# Patient Record
Sex: Male | Born: 1965 | Race: White | Hispanic: No | Marital: Married | Smoking: Never smoker
Health system: Southern US, Community
[De-identification: ages and names within clinical notes are randomized; demographics above are authoritative.]

## PROBLEM LIST (undated history)

## (undated) DIAGNOSIS — E7800 Pure hypercholesterolemia, unspecified: Secondary | ICD-10-CM

## (undated) DIAGNOSIS — Z8619 Personal history of other infectious and parasitic diseases: Secondary | ICD-10-CM

## (undated) DIAGNOSIS — I10 Essential (primary) hypertension: Secondary | ICD-10-CM

## (undated) HISTORY — PX: TUMOR REMOVAL: SHX12

## (undated) HISTORY — DX: Pure hypercholesterolemia, unspecified: E78.00

## (undated) HISTORY — DX: Essential (primary) hypertension: I10

## (undated) HISTORY — DX: Personal history of other infectious and parasitic diseases: Z86.19

---

## 2012-09-26 ENCOUNTER — Ambulatory Visit: Payer: Self-pay | Admitting: Surgery

## 2012-12-23 ENCOUNTER — Encounter: Payer: Self-pay | Admitting: Internal Medicine

## 2012-12-23 ENCOUNTER — Ambulatory Visit (INDEPENDENT_AMBULATORY_CARE_PROVIDER_SITE_OTHER): Payer: BC Managed Care – PPO | Admitting: Internal Medicine

## 2012-12-23 VITALS — BP 130/100 | HR 67 | Temp 97.7°F | Ht 68.2 in | Wt 245.5 lb

## 2012-12-23 DIAGNOSIS — D179 Benign lipomatous neoplasm, unspecified: Secondary | ICD-10-CM

## 2012-12-23 DIAGNOSIS — E7800 Pure hypercholesterolemia, unspecified: Secondary | ICD-10-CM

## 2012-12-23 DIAGNOSIS — Z125 Encounter for screening for malignant neoplasm of prostate: Secondary | ICD-10-CM

## 2012-12-23 DIAGNOSIS — R030 Elevated blood-pressure reading, without diagnosis of hypertension: Secondary | ICD-10-CM

## 2012-12-23 LAB — CBC WITH DIFFERENTIAL/PLATELET
Basophils Absolute: 0 10*3/uL (ref 0.0–0.1)
Eosinophils Relative: 2.8 % (ref 0.0–5.0)
HCT: 46.6 % (ref 39.0–52.0)
Lymphs Abs: 1.8 10*3/uL (ref 0.7–4.0)
MCV: 84.7 fl (ref 78.0–100.0)
Monocytes Absolute: 0.5 10*3/uL (ref 0.1–1.0)
Monocytes Relative: 9.2 % (ref 3.0–12.0)
Neutrophils Relative %: 56.8 % (ref 43.0–77.0)
Platelets: 282 10*3/uL (ref 150.0–400.0)
RDW: 13.4 % (ref 11.5–14.6)
WBC: 5.8 10*3/uL (ref 4.5–10.5)

## 2012-12-23 LAB — COMPREHENSIVE METABOLIC PANEL
ALT: 26 U/L (ref 0–53)
AST: 19 U/L (ref 0–37)
Albumin: 3.8 g/dL (ref 3.5–5.2)
Alkaline Phosphatase: 77 U/L (ref 39–117)
Glucose, Bld: 87 mg/dL (ref 70–99)
Potassium: 4.4 mEq/L (ref 3.5–5.1)
Sodium: 139 mEq/L (ref 135–145)
Total Bilirubin: 0.7 mg/dL (ref 0.3–1.2)
Total Protein: 7.2 g/dL (ref 6.0–8.3)

## 2012-12-23 LAB — URINALYSIS, ROUTINE W REFLEX MICROSCOPIC
Bilirubin Urine: NEGATIVE
Leukocytes, UA: NEGATIVE
Nitrite: NEGATIVE
Total Protein, Urine: NEGATIVE
Urobilinogen, UA: 0.2 (ref 0.0–1.0)

## 2012-12-23 LAB — LIPID PANEL
HDL: 39.2 mg/dL (ref >39.00)
Total CHOL/HDL Ratio: 7
VLDL: 28.6 mg/dL (ref 0.0–40.0)

## 2012-12-23 LAB — PSA: PSA: 0.42 ng/mL (ref 0.10–4.00)

## 2012-12-23 LAB — TSH: TSH: 0.85 u[IU]/mL (ref 0.35–5.50)

## 2012-12-23 NOTE — Progress Notes (Signed)
  Subjective:    Patient ID: Samuel Elliott, male    DOB: [birth year], 47 y.o.   MRN: [id number]  HPI 47 year old male with past history of hypertension and hypercholesterolemia who comes in today to follow up on these issues as well as to establish care.  Previously seen at [facility].  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  Plays golf.  Tries to stay active.  No cardiac symptoms with increased activity or exertion.  Bowels stable.     Past Medical History  Diagnosis Date  . History of chicken pox   . Hypercholesterolemia   . Hypertension     Review of Systems Patient denies any headache, lightheadedness or dizziness.  No sinus or allergy symptoms.  No chest pain, tightness or palpitations.  No increased shortness of breath, cough or congestion.  No nausea or vomiting.  No acid reflux.  No abdominal pain or cramping.  No bowel change, such as diarrhea, constipation, BRBPR or melana.  No urine change.   Tries to stay active.  Overall he feels he is doing well.       Objective:   Physical Exam Filed Vitals:   12/23/12 0919  BP: 130/100  Pulse: 67  Temp: 97.7 F (36.5 C)   Blood pressure recheck:  42/40  47 year old male in no acute distress.  HEENT:  Nares - clear.  Oropharynx - without lesions. NECK:  Supple.  Nontender.  No audible carotid bruit.  HEART:  Appears to be regular.   LUNGS:  No crackles or wheezing audible.  Respirations even and unlabored.   RADIAL PULSE:  Equal bilaterally.  ABDOMEN:  Soft.  Nontender.  Bowel sounds present and normal.  No audible abdominal bruit.    EXTREMITIES:  No increased edema present.  DP pulses palpable and equal bilaterally.          Assessment & Plan:  HEALTH MAINTENANCE.  Schedule him for a complete physical.  Check cholesterol and PSA.    I spent 30 minutes with the patient and more than 50% of the time was spent in consultation regarding the above.

## 2012-12-28 ENCOUNTER — Encounter: Payer: Self-pay | Admitting: Internal Medicine

## 2012-12-28 ENCOUNTER — Other Ambulatory Visit: Payer: Self-pay | Admitting: Internal Medicine

## 2012-12-28 DIAGNOSIS — D179 Benign lipomatous neoplasm, unspecified: Secondary | ICD-10-CM | POA: Insufficient documentation

## 2012-12-28 DIAGNOSIS — E7800 Pure hypercholesterolemia, unspecified: Secondary | ICD-10-CM | POA: Insufficient documentation

## 2012-12-28 DIAGNOSIS — I10 Essential (primary) hypertension: Secondary | ICD-10-CM | POA: Insufficient documentation

## 2012-12-28 NOTE — Progress Notes (Signed)
Order placed for f/u liver panel check.  

## 2012-12-28 NOTE — Assessment & Plan Note (Signed)
S/p removal.  Doing well.  Evaluated by Dr Ely.  

## 2012-12-28 NOTE — Assessment & Plan Note (Signed)
Blood pressure on recheck today 132/86.  Will have him spot check his pressure and record readings.  Get him back in soon to reassess.  If persistent elevation, may need medication.

## 2012-12-28 NOTE — Assessment & Plan Note (Signed)
Low cholesterol diet and exercise.  Check lipid panel.   

## 2012-12-31 NOTE — Progress Notes (Signed)
Wife notified of lab results-will call back with the name of the Cholesterol medication that he has taken in the past (if caused him to have leg cramps)-will schedule lab appt at that time also.

## 2013-01-05 ENCOUNTER — Other Ambulatory Visit: Payer: Self-pay | Admitting: *Deleted

## 2013-01-05 MED ORDER — PRAVASTATIN SODIUM 10 MG PO TABS: 10.0000 mg | ORAL_TABLET | Freq: Every day | ORAL | Status: DC

## 2013-01-05 NOTE — Telephone Encounter (Signed)
Pt states that Lipitor caused leg cramps-would like an alternative (per last phone note). Phoned in Pravastatin 10mg  daily. Pt to schedule a 6 wk lab appt

## 2013-02-10 ENCOUNTER — Encounter: Payer: Self-pay | Admitting: Internal Medicine

## 2013-02-17 ENCOUNTER — Ambulatory Visit (INDEPENDENT_AMBULATORY_CARE_PROVIDER_SITE_OTHER): Payer: BC Managed Care – PPO | Admitting: Internal Medicine

## 2013-02-17 ENCOUNTER — Encounter: Payer: Self-pay | Admitting: Internal Medicine

## 2013-02-17 VITALS — BP 130/90 | HR 64 | Temp 98.7°F | Ht 68.2 in | Wt 248.0 lb

## 2013-02-17 DIAGNOSIS — E7800 Pure hypercholesterolemia, unspecified: Secondary | ICD-10-CM

## 2013-02-17 DIAGNOSIS — D179 Benign lipomatous neoplasm, unspecified: Secondary | ICD-10-CM

## 2013-02-17 DIAGNOSIS — I10 Essential (primary) hypertension: Secondary | ICD-10-CM

## 2013-02-17 MED ORDER — LISINOPRIL 10 MG PO TABS: 10.0000 mg | ORAL_TABLET | Freq: Every day | ORAL | Status: DC

## 2013-02-18 ENCOUNTER — Encounter: Payer: Self-pay | Admitting: Internal Medicine

## 2013-02-18 NOTE — Assessment & Plan Note (Signed)
S/p removal.  Doing well.  Evaluated by Dr Ely.  

## 2013-02-18 NOTE — Assessment & Plan Note (Signed)
Persistent elevation.  Recent metabolic panel wnl.  Start lisinopril 10mg  q day.  Follow pressures.  Get him back in soon to reassess.  Check metabolic panel in two weeks.

## 2013-02-18 NOTE — Assessment & Plan Note (Signed)
Low cholesterol diet and exercise.  Now on pravastatin.  Tolerating.  Check liver panel.

## 2013-02-18 NOTE — Progress Notes (Signed)
  Subjective:    Patient ID: Samuel Elliott, male    DOB: [birth year], 47 y.o.   MRN: [id number]  HPI 47 year old male with past history of hypertension and hypercholesterolemia who comes in today to follow up on these issues as well as for a complete physical exam.  Plays golf.  Tries to stay active.  No cardiac symptoms with increased activity or exertion.  Bowels stable.  Has been checking his blood pressures and blood pressure remains elevated - averaging 150-160s/70-90.  No headache or dizziness.  Overall he feels he is doing well.  Taking the pravastatin.  Tolerating.     Past Medical History  Diagnosis Date  . History of chicken pox   . Hypercholesterolemia   . Hypertension     Current Outpatient Prescriptions on File Prior to Visit  Medication Sig Dispense Refill  . pravastatin (PRAVACHOL) 10 MG tablet Take 1 tablet (10 mg total) by mouth daily.  30 tablet  5   No current facility-administered medications on file prior to visit.    Review of Systems Patient denies any headache, lightheadedness or dizziness.  No sinus or allergy symptoms.  No chest pain, tightness or palpitations.  No increased shortness of breath, cough or congestion.  No nausea or vomiting.  No acid reflux.  No abdominal pain or cramping.  No bowel change, such as diarrhea, constipation, BRBPR or melana.  No urine change.   Tries to stay active.  Overall he feels he is doing well.  Blood pressures as outlined.      Objective:   Physical Exam  Filed Vitals:   02/17/13 0853  BP: 130/90  Pulse: 64  Temp: 98.7 F (37.1 C)   Blood pressure recheck:  144/94, pulse 25  47 year old male in no acute distress.  HEENT:  Nares - clear.  Oropharynx - without lesions. NECK:  Supple.  Nontender.  No audible carotid bruit.  HEART:  Appears to be regular.   LUNGS:  No crackles or wheezing audible.  Respirations even and unlabored.   RADIAL PULSE:  Equal bilaterally.  ABDOMEN:  Soft.  Nontender.  Bowel sounds present and  normal.  No audible abdominal bruit.  GU:  Normal descended testicles.  No palpable testicular nodules.   RECTAL:  Could not appreciate any palpable prostate nodules.  Heme negative.   EXTREMITIES:  No increased edema present.  DP pulses palpable and equal bilaterally.         Assessment & Plan:  HEALTH MAINTENANCE.  Physical today.    12/23/12 PSA .42.

## 2013-03-02 ENCOUNTER — Other Ambulatory Visit (INDEPENDENT_AMBULATORY_CARE_PROVIDER_SITE_OTHER): Payer: BC Managed Care – PPO

## 2013-03-02 DIAGNOSIS — I10 Essential (primary) hypertension: Secondary | ICD-10-CM

## 2013-03-02 DIAGNOSIS — E7800 Pure hypercholesterolemia, unspecified: Secondary | ICD-10-CM

## 2013-03-02 LAB — BASIC METABOLIC PANEL
BUN: 10 mg/dL (ref 6–23)
CO2: 27 mEq/L (ref 19–32)
Chloride: 106 mEq/L (ref 96–112)
Creatinine, Ser: 1.1 mg/dL (ref 0.4–1.5)

## 2013-03-02 LAB — HEPATIC FUNCTION PANEL
ALT: 33 U/L (ref 0–53)
AST: 20 U/L (ref 0–37)
Albumin: 3.6 g/dL (ref 3.5–5.2)
Alkaline Phosphatase: 74 U/L (ref 39–117)
Bilirubin, Direct: 0.1 mg/dL (ref 0.0–0.3)

## 2013-03-03 ENCOUNTER — Encounter: Payer: Self-pay | Admitting: Internal Medicine

## 2013-03-13 ENCOUNTER — Encounter: Payer: Self-pay | Admitting: Internal Medicine

## 2013-03-13 ENCOUNTER — Ambulatory Visit (INDEPENDENT_AMBULATORY_CARE_PROVIDER_SITE_OTHER): Payer: BC Managed Care – PPO | Admitting: Internal Medicine

## 2013-03-13 VITALS — BP 130/80 | HR 71 | Temp 97.8°F | Ht 68.2 in | Wt 252.8 lb

## 2013-03-13 DIAGNOSIS — Z23 Encounter for immunization: Secondary | ICD-10-CM

## 2013-03-13 DIAGNOSIS — D179 Benign lipomatous neoplasm, unspecified: Secondary | ICD-10-CM

## 2013-03-13 DIAGNOSIS — E7800 Pure hypercholesterolemia, unspecified: Secondary | ICD-10-CM

## 2013-03-13 DIAGNOSIS — I10 Essential (primary) hypertension: Secondary | ICD-10-CM

## 2013-03-13 MED ORDER — LISINOPRIL 20 MG PO TABS: 20.0000 mg | ORAL_TABLET | Freq: Every day | ORAL | Status: DC

## 2013-03-13 MED ORDER — PRAVASTATIN SODIUM 10 MG PO TABS: 10.0000 mg | ORAL_TABLET | Freq: Every day | ORAL | Status: DC

## 2013-03-13 NOTE — Progress Notes (Signed)
Pre-visit discussion using our clinic review tool. No additional management support is needed unless otherwise documented below in the visit note.  

## 2013-03-13 NOTE — Assessment & Plan Note (Signed)
S/p removal.  Doing well.  Evaluated by Dr Ely.  

## 2013-03-13 NOTE — Assessment & Plan Note (Addendum)
Blood pressure as outlined.  Still elevated.   Will increase lisinopril to 20mg  q day.  Follow pressures.

## 2013-03-13 NOTE — Progress Notes (Signed)
  Subjective:    Patient ID: Samuel Elliott, male    DOB: [birth year], 47 y.o.   MRN: [id number]  HPI 47 year old male with past history of hypertension and hypercholesterolemia who comes in today for a scheduled follow up.   Plays golf.  Tries to stay active.  No cardiac symptoms with increased activity or exertion.  Bowels stable.  Has been checking his blood pressures and blood pressure remains elevated - averaging 140s/80-90.  No headache or dizziness.  Overall he feels he is doing well.  Taking the pravastatin.  Tolerating.  Tolerating lisinopril.  Just started last visit.     Past Medical History  Diagnosis Date  . History of chicken pox   . Hypercholesterolemia   . Hypertension     Current Outpatient Prescriptions on File Prior to Visit  Medication Sig Dispense Refill  . lisinopril (PRINIVIL,ZESTRIL) 10 MG tablet Take 1 tablet (10 mg total) by mouth daily.  30 tablet  1  . pravastatin (PRAVACHOL) 10 MG tablet Take 1 tablet (10 mg total) by mouth daily.  30 tablet  5   No current facility-administered medications on file prior to visit.    Review of Systems Patient denies any headache, lightheadedness or dizziness.  No sinus or allergy symptoms.  No chest pain, tightness or palpitations.  No increased shortness of breath, cough or congestion.  No nausea or vomiting.  No acid reflux.  No abdominal pain or cramping.  No bowel change, such as diarrhea, constipation, BRBPR or melana.  No urine change.   Tries to stay active.  Overall he feels he is doing well.  Blood pressures as outlined.  Tolerating lisinopril.      Objective:   Physical Exam  Filed Vitals:   03/13/13 0914  BP: 130/80  Pulse: 71  Temp: 97.8 F (36.6 C)   Blood pressure recheck:  23/4  47 year old male in no acute distress.  HEENT:  Nares - clear.  Oropharynx - without lesions. NECK:  Supple.  Nontender.  No audible carotid bruit.  HEART:  Appears to be regular.   LUNGS:  No crackles or wheezing audible.   Respirations even and unlabored.   RADIAL PULSE:  Equal bilaterally.  ABDOMEN:  Soft.  Nontender.  Bowel sounds present and normal.  No audible abdominal bruit.   EXTREMITIES:  No increased edema present.  DP pulses palpable and equal bilaterally.         Assessment & Plan:  HEALTH MAINTENANCE.  Physical 02/17/13.    12/23/12 PSA .42.

## 2013-03-13 NOTE — Assessment & Plan Note (Addendum)
Low cholesterol diet and exercise.  Now on pravastatin.  Tolerating.  Follow lipid panel and liver function.     

## 2013-03-15 ENCOUNTER — Encounter: Payer: Self-pay | Admitting: Internal Medicine

## 2013-05-07 ENCOUNTER — Encounter: Payer: Self-pay | Admitting: Internal Medicine

## 2013-05-08 ENCOUNTER — Encounter: Payer: Self-pay | Admitting: Internal Medicine

## 2013-05-08 ENCOUNTER — Ambulatory Visit (INDEPENDENT_AMBULATORY_CARE_PROVIDER_SITE_OTHER): Payer: BC Managed Care – PPO | Admitting: Internal Medicine

## 2013-05-08 ENCOUNTER — Other Ambulatory Visit: Payer: Self-pay | Admitting: Internal Medicine

## 2013-05-08 VITALS — BP 130/80 | HR 63 | Temp 97.8°F | Ht 68.2 in | Wt 246.5 lb

## 2013-05-08 DIAGNOSIS — I10 Essential (primary) hypertension: Secondary | ICD-10-CM

## 2013-05-08 DIAGNOSIS — E7800 Pure hypercholesterolemia, unspecified: Secondary | ICD-10-CM

## 2013-05-08 DIAGNOSIS — D179 Benign lipomatous neoplasm, unspecified: Secondary | ICD-10-CM

## 2013-05-08 LAB — BASIC METABOLIC PANEL
BUN: 10 mg/dL (ref 6–23)
CHLORIDE: 106 meq/L (ref 96–112)
CO2: 30 mEq/L (ref 19–32)
Calcium: 10.3 mg/dL (ref 8.4–10.5)
Creatinine, Ser: 1.1 mg/dL (ref 0.4–1.5)
GFR: 78.95 mL/min (ref >60.00)
Glucose, Bld: 81 mg/dL (ref 70–99)
Potassium: 4.3 mEq/L (ref 3.5–5.1)
SODIUM: 140 meq/L (ref 135–145)

## 2013-05-08 LAB — HEPATIC FUNCTION PANEL
ALK PHOS: 80 U/L (ref 39–117)
ALT: 28 U/L (ref 0–53)
AST: 18 U/L (ref 0–37)
Albumin: 3.7 g/dL (ref 3.5–5.2)
BILIRUBIN DIRECT: 0.1 mg/dL (ref 0.0–0.3)
TOTAL PROTEIN: 6.8 g/dL (ref 6.0–8.3)
Total Bilirubin: 0.7 mg/dL (ref 0.3–1.2)

## 2013-05-08 LAB — LIPID PANEL
Cholesterol: 224 mg/dL — ABNORMAL HIGH (ref 0–200)
HDL: 37.1 mg/dL — ABNORMAL LOW (ref >39.00)
Total CHOL/HDL Ratio: 6
Triglycerides: 147 mg/dL (ref 0.0–149.0)
VLDL: 29.4 mg/dL (ref 0.0–40.0)

## 2013-05-08 LAB — LDL CHOLESTEROL, DIRECT: Direct LDL: 166.3 mg/dL

## 2013-05-08 MED ORDER — LISINOPRIL 20 MG PO TABS: 20.0000 mg | ORAL_TABLET | Freq: Every day | ORAL | Status: DC

## 2013-05-08 NOTE — Progress Notes (Signed)
  Subjective:    Patient ID: Samuel Elliott, male    DOB: 13-Jun-1965, 48 y.o.   MRN: [id number]  HPI 48 year old male with past history of hypertension and hypercholesterolemia who comes in today for a scheduled follow up.   Plays golf.  Tries to stay active.  No cardiac symptoms with increased activity or exertion.  Bowels stable.  No headache or dizziness.  Overall he feels he is doing well.  Taking the pravastatin.  Tolerating.  Tolerating lisinopril.  Blood pressure has been doing well.     Past Medical History  Diagnosis Date  . History of chicken pox   . Hypercholesterolemia   . Hypertension     Current Outpatient Prescriptions on File Prior to Visit  Medication Sig Dispense Refill  . lisinopril (PRINIVIL,ZESTRIL) 20 MG tablet Take 1 tablet (20 mg total) by mouth daily.  90 tablet  1  . pravastatin (PRAVACHOL) 10 MG tablet Take 1 tablet (10 mg total) by mouth daily.  90 tablet  1   No current facility-administered medications on file prior to visit.    Review of Systems Patient denies any headache, lightheadedness or dizziness.  No sinus or allergy symptoms.  No chest pain, tightness or palpitations.  No increased shortness of breath, cough or congestion.  No nausea or vomiting.  No acid reflux.  No abdominal pain or cramping.  No bowel change, such as diarrhea, constipation, BRBPR or melana.  No urine change.   Tries to stay active.  Overall he feels he is doing well.  Blood pressures doing better.  States his blood pressure is averaging 120-130/80-85.  Tolerating lisinopril.      Objective:   Physical Exam  Filed Vitals:   05/08/13 0936  BP: 130/80  Pulse: 63  Temp: 97.8 F (36.6 C)   Blood pressure recheck:  27/45  48 year old male in no acute distress.  HEENT:  Nares - clear.  Oropharynx - without lesions. NECK:  Supple.  Nontender.  No audible carotid bruit.  HEART:  Appears to be regular.   LUNGS:  No crackles or wheezing audible.  Respirations even and unlabored.    RADIAL PULSE:  Equal bilaterally.  ABDOMEN:  Soft.  Nontender.  Bowel sounds present and normal.  No audible abdominal bruit.   EXTREMITIES:  No increased edema present.  DP pulses palpable and equal bilaterally.         Assessment & Plan:  HEALTH MAINTENANCE.  Physical 02/17/13.    12/23/12 PSA .42.

## 2013-05-08 NOTE — Progress Notes (Signed)
Pre-visit discussion using our clinic review tool. No additional management support is needed unless otherwise documented below in the visit note.  

## 2013-05-08 NOTE — Assessment & Plan Note (Addendum)
Blood pressure better on lisinopril 20mg q day.  Check metabolic panel.  Follow.   

## 2013-05-08 NOTE — Progress Notes (Signed)
Orders placed for f/u labs.  

## 2013-05-10 ENCOUNTER — Encounter: Payer: Self-pay | Admitting: Internal Medicine

## 2013-05-10 ENCOUNTER — Other Ambulatory Visit: Payer: Self-pay | Admitting: Internal Medicine

## 2013-05-10 MED ORDER — PRAVASTATIN SODIUM 20 MG PO TABS: 20.0000 mg | ORAL_TABLET | Freq: Every day | ORAL | Status: DC

## 2013-05-10 NOTE — Progress Notes (Signed)
Pravastatin changed to 20mg  q day and rx sent in for #90 with one refill.

## 2013-05-10 NOTE — Assessment & Plan Note (Signed)
Low cholesterol diet and exercise.  Now on pravastatin.  Tolerating.  Follow lipid panel and liver function.     

## 2013-05-10 NOTE — Assessment & Plan Note (Signed)
S/p removal.  Doing well.  Evaluated by Dr Pat Patrick.

## 2013-09-11 ENCOUNTER — Ambulatory Visit: Payer: BC Managed Care – PPO | Admitting: Internal Medicine

## 2013-10-01 ENCOUNTER — Ambulatory Visit: Payer: BC Managed Care – PPO | Admitting: Internal Medicine

## 2013-11-02 ENCOUNTER — Ambulatory Visit (INDEPENDENT_AMBULATORY_CARE_PROVIDER_SITE_OTHER): Payer: BC Managed Care – PPO | Admitting: Internal Medicine

## 2013-11-02 ENCOUNTER — Encounter: Payer: Self-pay | Admitting: Internal Medicine

## 2013-11-02 VITALS — BP 130/80 | HR 64 | Temp 98.2°F | Ht 68.2 in | Wt 246.5 lb

## 2013-11-02 DIAGNOSIS — I10 Essential (primary) hypertension: Secondary | ICD-10-CM

## 2013-11-02 DIAGNOSIS — E7800 Pure hypercholesterolemia, unspecified: Secondary | ICD-10-CM

## 2013-11-02 DIAGNOSIS — L299 Pruritus, unspecified: Secondary | ICD-10-CM

## 2013-11-02 LAB — LIPID PANEL
CHOL/HDL RATIO: 5
Cholesterol: 207 mg/dL — ABNORMAL HIGH (ref 0–200)
HDL: 38.2 mg/dL — ABNORMAL LOW (ref >39.00)
LDL CALC: 144 mg/dL — AB (ref 0–99)
NonHDL: 168.8
Triglycerides: 125 mg/dL (ref 0.0–149.0)
VLDL: 25 mg/dL (ref 0.0–40.0)

## 2013-11-02 LAB — BASIC METABOLIC PANEL
BUN: 11 mg/dL (ref 6–23)
CALCIUM: 9.9 mg/dL (ref 8.4–10.5)
CO2: 29 meq/L (ref 19–32)
CREATININE: 1.3 mg/dL (ref 0.4–1.5)
Chloride: 106 mEq/L (ref 96–112)
GFR: 61.17 mL/min (ref >60.00)
Glucose, Bld: 108 mg/dL — ABNORMAL HIGH (ref 70–99)
Potassium: 4.6 mEq/L (ref 3.5–5.1)
Sodium: 139 mEq/L (ref 135–145)

## 2013-11-02 LAB — HEPATIC FUNCTION PANEL
ALT: 35 U/L (ref 0–53)
AST: 21 U/L (ref 0–37)
Albumin: 3.7 g/dL (ref 3.5–5.2)
Alkaline Phosphatase: 71 U/L (ref 39–117)
BILIRUBIN TOTAL: 0.7 mg/dL (ref 0.2–1.2)
Bilirubin, Direct: 0 mg/dL (ref 0.0–0.3)
Total Protein: 7.1 g/dL (ref 6.0–8.3)

## 2013-11-02 NOTE — Assessment & Plan Note (Signed)
Pt request referral to ENT (Dr Kathyrn Sheriff) to have ears evaluated and cleaned.  Request appt in 11/15.  No pain in ear.

## 2013-11-02 NOTE — Assessment & Plan Note (Signed)
Blood pressure better on lisinopril 20mg  q day.  Check metabolic panel.  Follow.

## 2013-11-02 NOTE — Progress Notes (Signed)
  Subjective:    Patient ID: Samuel Fast., male    DOB: Apr 09, 1966, 48 y.o.   MRN: [id number]  HPI 48 year old male with past history of hypertension and hypercholesterolemia who comes in today for a scheduled follow up.   Plays golf.  Tries to stay active.  No cardiac symptoms with increased activity or exertion.  Bowels stable.  No headache or dizziness.  Overall he feels he is doing well.  Taking the pravastatin.  Tolerating.  Tolerating lisinopril.  Blood pressure has been doing well.  States blood pressure averaging low 120s-130/80-85.  Does request ENT referral for intermittent ear itching and wants ears cleaned.  Wants to see Dr Kathyrn Sheriff.     Past Medical History  Diagnosis Date  . History of chicken pox   . Hypercholesterolemia   . Hypertension     Current Outpatient Prescriptions on File Prior to Visit  Medication Sig Dispense Refill  . lisinopril (PRINIVIL,ZESTRIL) 20 MG tablet Take 1 tablet (20 mg total) by mouth daily.  90 tablet  1  . pravastatin (PRAVACHOL) 20 MG tablet Take 1 tablet (20 mg total) by mouth daily.  90 tablet  1   No current facility-administered medications on file prior to visit.    Review of Systems Patient denies any headache, lightheadedness or dizziness.  No significant sinus or allergy symptoms.  No chest pain, tightness or palpitations.  No increased shortness of breath, cough or congestion.  No nausea or vomiting.  No acid reflux.  No abdominal pain or cramping.  No bowel change, such as diarrhea, constipation, BRBPR or melana.  No urine change.   Tries to stay active.  Overall he feels he is doing well.  Blood pressures doing better.  States his blood pressure is averaging 120-130/80-85.  Tolerating lisinopril.  Request ENT referral as outlined.       Objective:   Physical Exam  Filed Vitals:   11/02/13 0758  BP: 130/80  Pulse: 64  Temp: 98.2 F (36.8 C)   Blood pressure recheck:  61/3   48 year old male in no acute distress.  HEENT:   Nares - clear.  Oropharynx - without lesions.  Ears- cerumen present.   NECK:  Supple.  Nontender.  No audible carotid bruit.  HEART:  Appears to be regular.   LUNGS:  No crackles or wheezing audible.  Respirations even and unlabored.   RADIAL PULSE:  Equal bilaterally.  ABDOMEN:  Soft.  Nontender.  Bowel sounds present and normal.  No audible abdominal bruit.   EXTREMITIES:  No increased edema present.  DP pulses palpable and equal bilaterally.         Assessment & Plan:  HEALTH MAINTENANCE.  Physical 02/17/13.    12/23/12 PSA .42.  Schedule a physical for next visit.

## 2013-11-02 NOTE — Assessment & Plan Note (Signed)
Low cholesterol diet and exercise.  Now on pravastatin.  Tolerating.  Follow lipid panel and liver function.

## 2013-11-02 NOTE — Addendum Note (Signed)
Addended by: Karlene Einstein D on: 11/02/2013 08:29 AM   Modules accepted: Orders

## 2013-11-03 ENCOUNTER — Other Ambulatory Visit: Payer: Self-pay | Admitting: Internal Medicine

## 2013-11-03 ENCOUNTER — Other Ambulatory Visit: Payer: Self-pay | Admitting: *Deleted

## 2013-11-03 DIAGNOSIS — R739 Hyperglycemia, unspecified: Secondary | ICD-10-CM

## 2013-11-03 MED ORDER — PRAVASTATIN SODIUM 40 MG PO TABS: 40.0000 mg | ORAL_TABLET | Freq: Every day | ORAL | Status: DC

## 2013-11-03 NOTE — Progress Notes (Signed)
Order placed for f/u labs.  

## 2013-11-18 ENCOUNTER — Other Ambulatory Visit (INDEPENDENT_AMBULATORY_CARE_PROVIDER_SITE_OTHER): Payer: BC Managed Care – PPO

## 2013-11-18 DIAGNOSIS — R7309 Other abnormal glucose: Secondary | ICD-10-CM

## 2013-11-18 DIAGNOSIS — R739 Hyperglycemia, unspecified: Secondary | ICD-10-CM

## 2013-11-18 LAB — BASIC METABOLIC PANEL
BUN: 13 mg/dL (ref 6–23)
CALCIUM: 9.6 mg/dL (ref 8.4–10.5)
CO2: 27 meq/L (ref 19–32)
CREATININE: 1.1 mg/dL (ref 0.4–1.5)
Chloride: 108 mEq/L (ref 96–112)
GFR: 73.93 mL/min (ref >60.00)
GLUCOSE: 95 mg/dL (ref 70–99)
Potassium: 4.2 mEq/L (ref 3.5–5.1)
Sodium: 143 mEq/L (ref 135–145)

## 2013-11-18 LAB — HEMOGLOBIN A1C: HEMOGLOBIN A1C: 5.5 % (ref 4.6–6.5)

## 2013-11-19 ENCOUNTER — Encounter: Payer: Self-pay | Admitting: Internal Medicine

## 2014-02-26 ENCOUNTER — Other Ambulatory Visit: Payer: Self-pay | Admitting: *Deleted

## 2014-02-26 MED ORDER — LISINOPRIL 20 MG PO TABS
20.0000 mg | ORAL_TABLET | Freq: Every day | ORAL | Status: DC
Start: 2014-02-26 — End: 2014-12-06

## 2014-03-05 ENCOUNTER — Encounter: Payer: BC Managed Care – PPO | Admitting: Internal Medicine

## 2014-04-22 ENCOUNTER — Ambulatory Visit (INDEPENDENT_AMBULATORY_CARE_PROVIDER_SITE_OTHER): Payer: BC Managed Care – PPO | Admitting: Internal Medicine

## 2014-04-22 ENCOUNTER — Encounter: Payer: Self-pay | Admitting: Internal Medicine

## 2014-04-22 ENCOUNTER — Ambulatory Visit (INDEPENDENT_AMBULATORY_CARE_PROVIDER_SITE_OTHER): Payer: BC Managed Care – PPO | Admitting: *Deleted

## 2014-04-22 VITALS — BP 110/70 | HR 62 | Temp 97.8°F | Ht 68.0 in | Wt 247.5 lb

## 2014-04-22 DIAGNOSIS — Z125 Encounter for screening for malignant neoplasm of prostate: Secondary | ICD-10-CM

## 2014-04-22 DIAGNOSIS — R739 Hyperglycemia, unspecified: Secondary | ICD-10-CM

## 2014-04-22 DIAGNOSIS — I10 Essential (primary) hypertension: Secondary | ICD-10-CM

## 2014-04-22 DIAGNOSIS — E7800 Pure hypercholesterolemia, unspecified: Secondary | ICD-10-CM

## 2014-04-22 DIAGNOSIS — Z23 Encounter for immunization: Secondary | ICD-10-CM

## 2014-04-22 DIAGNOSIS — E669 Obesity, unspecified: Secondary | ICD-10-CM

## 2014-04-22 DIAGNOSIS — E041 Nontoxic single thyroid nodule: Secondary | ICD-10-CM

## 2014-04-22 DIAGNOSIS — E049 Nontoxic goiter, unspecified: Secondary | ICD-10-CM

## 2014-04-22 LAB — CBC WITH DIFFERENTIAL/PLATELET
BASOS ABS: 0 10*3/uL (ref 0.0–0.1)
Basophils Relative: 0.5 % (ref 0.0–3.0)
EOS ABS: 0.2 10*3/uL (ref 0.0–0.7)
Eosinophils Relative: 2.3 % (ref 0.0–5.0)
HCT: 47.7 % (ref 39.0–52.0)
Hemoglobin: 16 g/dL (ref 13.0–17.0)
Lymphocytes Relative: 27.1 % (ref 12.0–46.0)
Lymphs Abs: 2 10*3/uL (ref 0.7–4.0)
MCHC: 33.5 g/dL (ref 30.0–36.0)
MCV: 87 fl (ref 78.0–100.0)
Monocytes Absolute: 0.5 10*3/uL (ref 0.1–1.0)
Monocytes Relative: 7.5 % (ref 3.0–12.0)
NEUTROS PCT: 62.6 % (ref 43.0–77.0)
Neutro Abs: 4.6 10*3/uL (ref 1.4–7.7)
Platelets: 289 10*3/uL (ref 150.0–400.0)
RBC: 5.48 Mil/uL (ref 4.22–5.81)
RDW: 13.2 % (ref 11.5–15.5)
WBC: 7.3 10*3/uL (ref 4.0–10.5)

## 2014-04-22 LAB — HEPATIC FUNCTION PANEL
ALBUMIN: 3.9 g/dL (ref 3.5–5.2)
ALT: 32 U/L (ref 0–53)
AST: 20 U/L (ref 0–37)
Alkaline Phosphatase: 80 U/L (ref 39–117)
Bilirubin, Direct: 0.1 mg/dL (ref 0.0–0.3)
Total Bilirubin: 0.5 mg/dL (ref 0.2–1.2)
Total Protein: 7.2 g/dL (ref 6.0–8.3)

## 2014-04-22 LAB — T3, FREE: T3 FREE: 3.8 pg/mL (ref 2.3–4.2)

## 2014-04-22 LAB — BASIC METABOLIC PANEL
BUN: 13 mg/dL (ref 6–23)
CO2: 27 mEq/L (ref 19–32)
Calcium: 10 mg/dL (ref 8.4–10.5)
Chloride: 105 mEq/L (ref 96–112)
Creatinine, Ser: 1.1 mg/dL (ref 0.4–1.5)
GFR: 74.57 mL/min (ref >60.00)
Glucose, Bld: 88 mg/dL (ref 70–99)
Potassium: 4.2 mEq/L (ref 3.5–5.1)
Sodium: 138 mEq/L (ref 135–145)

## 2014-04-22 LAB — LIPID PANEL
CHOL/HDL RATIO: 6
CHOLESTEROL: 217 mg/dL — AB (ref 0–200)
HDL: 36.5 mg/dL — ABNORMAL LOW (ref >39.00)
LDL Cholesterol: 147 mg/dL — ABNORMAL HIGH (ref 0–99)
NonHDL: 180.5
TRIGLYCERIDES: 167 mg/dL — AB (ref 0.0–149.0)
VLDL: 33.4 mg/dL (ref 0.0–40.0)

## 2014-04-22 LAB — T4, FREE: Free T4: 0.84 ng/dL (ref 0.60–1.60)

## 2014-04-22 LAB — HEMOGLOBIN A1C: Hgb A1c MFr Bld: 5.6 % (ref 4.6–6.5)

## 2014-04-22 LAB — TSH: TSH: 0.86 u[IU]/mL (ref 0.35–4.50)

## 2014-04-22 LAB — PSA: PSA: 0.53 ng/mL (ref 0.10–4.00)

## 2014-04-22 NOTE — Progress Notes (Signed)
Subjective:    Patient ID: Samuel Fast., male    DOB: 04/04/1966, 48 y.o.   MRN: [id number]  HPI 48 year old male with past history of hypertension and hypercholesterolemia who comes in today to follow up on these issues as well as for a complete physical exam.  Tries to stay active.  No cardiac symptoms with increased activity or exertion.  Bowels stable.  No headache or dizziness.  Overall he feels he is doing well.  Taking the pravastatin.  Tolerating.  Tolerating lisinopril.  Blood pressure has been doing well.  Does report recently noticing some drainage.  Sore throat last week.  No sinus pressure.  No fever.  No cough or chest congestion.      Past Medical History  Diagnosis Date  . History of chicken pox   . Hypercholesterolemia   . Hypertension     Current Outpatient Prescriptions on File Prior to Visit  Medication Sig Dispense Refill  . aspirin 81 MG tablet Take 81 mg by mouth daily.    Marland Kitchen lisinopril (PRINIVIL,ZESTRIL) 20 MG tablet Take 1 tablet (20 mg total) by mouth daily. 90 tablet 1  . pravastatin (PRAVACHOL) 40 MG tablet Take 1 tablet (40 mg total) by mouth daily. 90 tablet 1   No current facility-administered medications on file prior to visit.    Review of Systems Patient denies any headache, lightheadedness or dizziness.  Some drainage.  Previous sore throat.   No chest pain, tightness or palpitations.  No increased shortness of breath, cough or congestion.  No nausea or vomiting.  No acid reflux.  No abdominal pain or cramping.  No bowel change, such as diarrhea, constipation, BRBPR or melana.  No urine change.   Tries to stay active.  Overall he feels he is doing well.  Blood pressure - doing better.       Objective:   Physical Exam  Filed Vitals:   04/22/14 1032  BP: 110/70  Pulse: 62  Temp: 97.8 F (36.6 C)   Blood pressure recheck:  110/76, pulse 67  48 year old male in no acute distress.  HEENT:  Nares - clear.  Oropharynx - without lesions. NECK:   Supple.  Nontender.  No audible carotid bruit.  Thyroid fullness.   HEART:  Appears to be regular.   LUNGS:  No crackles or wheezing audible.  Respirations even and unlabored.   RADIAL PULSE:  Equal bilaterally.  ABDOMEN:  Soft.  Nontender.  Bowel sounds present and normal.  No audible abdominal bruit.  GU:  Normal descended testicles.  No palpable testicular nodules.   RECTAL:  Could not appreciate any palpable prostate nodules.  Heme negative.   EXTREMITIES:  No increased edema present.  DP pulses palpable and equal bilaterally.      Assessment & Plan:  1. Essential hypertension, benign Blood pressure doing well.  Same medication regimen.  - Basic metabolic panel  2. Hypercholesterolemia Low cholesterol diet and exercise.  On pravastatin.  Tolerating.  Had intolerance to lipitor.   - Lipid panel - Hepatic function panel  3. Hyperglycemia Low carb diet.   - TSH - Hemoglobin A1c  4. Prostate cancer screening - PSA  5. Enlarged thyroid Enlarged thyroid noted on exam today.   - US Soft Tissue Head/Neck; Future - T3, free - T4, free  6. Obesity (BMI 30-39.9) Discussed diet and exercise.    7. Thyroid nodule See above.  Thyroid ultrasound and check thyroid function tests.    HEALTH  MAINTENANCE.  Physical today.     12/23/12 PSA .42.   Recheck today.

## 2014-04-22 NOTE — Patient Instructions (Signed)
Saline nasal spray - flush nose at least 2-3x/day ? ?Nasacort nasal spray - 2 sprays each nostril one time per day.  Do this in the evening.  ?

## 2014-04-22 NOTE — Progress Notes (Signed)
Pre visit review using our clinic review tool, if applicable. No additional management support is needed unless otherwise documented below in the visit note. 

## 2014-04-27 ENCOUNTER — Ambulatory Visit: Payer: Self-pay | Admitting: Internal Medicine

## 2014-04-28 ENCOUNTER — Encounter: Payer: Self-pay | Admitting: Internal Medicine

## 2014-04-29 ENCOUNTER — Telehealth: Payer: Self-pay | Admitting: Internal Medicine

## 2014-04-29 DIAGNOSIS — E041 Nontoxic single thyroid nodule: Secondary | ICD-10-CM

## 2014-04-29 NOTE — Telephone Encounter (Signed)
Pt notified of thyroid ultrasound results.

## 2014-04-30 ENCOUNTER — Encounter: Payer: Self-pay | Admitting: Internal Medicine

## 2014-04-30 ENCOUNTER — Telehealth: Payer: Self-pay | Admitting: Internal Medicine

## 2014-04-30 DIAGNOSIS — E7800 Pure hypercholesterolemia, unspecified: Secondary | ICD-10-CM

## 2014-04-30 DIAGNOSIS — Z6839 Body mass index (BMI) 39.0-39.9, adult: Secondary | ICD-10-CM | POA: Insufficient documentation

## 2014-04-30 DIAGNOSIS — E041 Nontoxic single thyroid nodule: Secondary | ICD-10-CM | POA: Insufficient documentation

## 2014-04-30 MED ORDER — ROSUVASTATIN CALCIUM 10 MG PO TABS: 10.0000 mg | ORAL_TABLET | Freq: Every day | ORAL | Status: DC

## 2014-04-30 NOTE — Addendum Note (Signed)
Addended by: Alisa Graff on: 04/30/2014 04:17 PM   Modules accepted: Orders

## 2014-04-30 NOTE — Telephone Encounter (Signed)
Order placed for endocrinology referral.  Pt notified via my chart.

## 2014-04-30 NOTE — Telephone Encounter (Signed)
rx for crestro #30 with two refills sent to pharmacy.  Pt notified via my chart.

## 2014-04-30 NOTE — Telephone Encounter (Signed)
Pt notified of lab results via my chart.  He needs a non fasting lab appt in 6 weeks.  Please schedule and contact him with a lab appt date and time.  Thanks.    Dr Nicki Reaper

## 2014-05-04 MED ORDER — PRAVASTATIN SODIUM 80 MG PO TABS: 80.0000 mg | ORAL_TABLET | Freq: Every day | ORAL | Status: DC

## 2014-05-04 NOTE — Telephone Encounter (Signed)
rx sent if for pravastatin 80mg  q day #90 with one refill.

## 2014-05-04 NOTE — Telephone Encounter (Signed)
E-prescribing error. Resent to pharmacy.

## 2014-05-04 NOTE — Addendum Note (Signed)
Addended by: Alisa Graff on: 05/04/2014 07:42 AM   Modules accepted: Orders, Medications

## 2014-05-04 NOTE — Addendum Note (Signed)
Addended by: Wynonia Lawman E on: 05/04/2014 08:06 AM   Modules accepted: Orders

## 2014-05-07 ENCOUNTER — Encounter: Payer: BC Managed Care – PPO | Admitting: Internal Medicine

## 2014-05-25 ENCOUNTER — Encounter: Payer: Self-pay | Admitting: Internal Medicine

## 2014-06-14 ENCOUNTER — Other Ambulatory Visit (INDEPENDENT_AMBULATORY_CARE_PROVIDER_SITE_OTHER): Payer: BC Managed Care – PPO

## 2014-06-14 DIAGNOSIS — E7800 Pure hypercholesterolemia, unspecified: Secondary | ICD-10-CM

## 2014-06-14 LAB — HEPATIC FUNCTION PANEL
ALT: 32 U/L (ref 0–53)
AST: 18 U/L (ref 0–37)
Albumin: 3.8 g/dL (ref 3.5–5.2)
Alkaline Phosphatase: 81 U/L (ref 39–117)
Bilirubin, Direct: 0.1 mg/dL (ref 0.0–0.3)
Total Bilirubin: 0.6 mg/dL (ref 0.2–1.2)
Total Protein: 6.6 g/dL (ref 6.0–8.3)

## 2014-06-14 NOTE — Addendum Note (Signed)
Addended by: Johnsie Cancel on: 06/14/2014 10:05 AM   Modules accepted: Orders

## 2014-06-15 ENCOUNTER — Encounter: Payer: Self-pay | Admitting: Internal Medicine

## 2014-06-15 ENCOUNTER — Telehealth: Payer: Self-pay | Admitting: Internal Medicine

## 2014-06-15 DIAGNOSIS — R739 Hyperglycemia, unspecified: Secondary | ICD-10-CM

## 2014-06-15 DIAGNOSIS — I10 Essential (primary) hypertension: Secondary | ICD-10-CM

## 2014-06-15 DIAGNOSIS — E7800 Pure hypercholesterolemia, unspecified: Secondary | ICD-10-CM

## 2014-06-15 NOTE — Telephone Encounter (Signed)
Pt notified of lab results via my chart.  Schedule fasting lab in 3 months.  Notify pt of lab appt date and time.    Thanks.  Dr Nicki Reaper

## 2014-08-20 NOTE — Op Note (Signed)
PATIENT NAME:  Samuel Elliott, Samuel Elliott MR#:  [id number] DATE OF BIRTH:  11-07-1965  DATE OF PROCEDURE:  09/26/2012  PREOPERATIVE DIAGNOSIS:  Mass, left shoulder.   POSTOPERATIVE DIAGNOSIS:  Mass, left shoulder.  PROCEDURE PERFORMED:  Excision mass, left shoulder.   ANESTHESIA:  Monitored anesthetic care.  SURGEON:  Rodena Goldmann, MD   OPERATIVE PROCEDURE:  With the patient in the right side down the left side up position and after the induction of appropriate sedation, the patient's left shoulder was prepped with Betadine and draped in sterile towels. Then 1% Xylocaine buffered with sodium bicarbonate was injected along the area identified preoperatively as the area of the mass. This area was incised and carried down through the subcutaneous with Bovie electrocautery. 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 It was removed in one lump mass. The area was irrigated. The fascial defect closed with 3-0 Vicryl. The subcutaneous space closed with 3-0 Vicryl and the skin closed with a running subcuticular suture of 4-0 Vicryl. Benzoin and Steri-Strips were applied. The patient returned to the Recovery Room having tolerated the procedure well. Sponge, instrument, and needle counts were correct x 2 in the Operating Room.    ____________________________ Rodena Goldmann III, MD rle:jm D: 09/26/2012 08:33:42 ET T: 09/26/2012 11:32:26 ET JOB#: [id number]  cc: Rodena Goldmann III, MD, <Dictator> Rodena Goldmann MD ELECTRONICALLY SIGNED 09/27/2012 9:45

## 2014-09-21 ENCOUNTER — Other Ambulatory Visit: Payer: BC Managed Care – PPO

## 2014-09-29 ENCOUNTER — Other Ambulatory Visit (INDEPENDENT_AMBULATORY_CARE_PROVIDER_SITE_OTHER): Payer: BC Managed Care – PPO

## 2014-09-29 DIAGNOSIS — R739 Hyperglycemia, unspecified: Secondary | ICD-10-CM | POA: Diagnosis not present

## 2014-09-29 DIAGNOSIS — E7800 Pure hypercholesterolemia, unspecified: Secondary | ICD-10-CM

## 2014-09-29 DIAGNOSIS — I10 Essential (primary) hypertension: Secondary | ICD-10-CM

## 2014-09-29 LAB — LIPID PANEL
CHOLESTEROL: 167 mg/dL (ref 0–200)
HDL: 39.8 mg/dL (ref >39.00)
LDL Cholesterol: 109 mg/dL — ABNORMAL HIGH (ref 0–99)
NonHDL: 127.2
TRIGLYCERIDES: 89 mg/dL (ref 0.0–149.0)
Total CHOL/HDL Ratio: 4
VLDL: 17.8 mg/dL (ref 0.0–40.0)

## 2014-09-29 LAB — HEPATIC FUNCTION PANEL
ALBUMIN: 4.2 g/dL (ref 3.5–5.2)
ALT: 35 U/L (ref 0–53)
AST: 23 U/L (ref 0–37)
Alkaline Phosphatase: 81 U/L (ref 39–117)
Bilirubin, Direct: 0.1 mg/dL (ref 0.0–0.3)
TOTAL PROTEIN: 7.3 g/dL (ref 6.0–8.3)
Total Bilirubin: 0.5 mg/dL (ref 0.2–1.2)

## 2014-09-29 LAB — BASIC METABOLIC PANEL
BUN: 10 mg/dL (ref 6–23)
CO2: 30 meq/L (ref 19–32)
Calcium: 10.2 mg/dL (ref 8.4–10.5)
Chloride: 105 mEq/L (ref 96–112)
Creatinine, Ser: 1.2 mg/dL (ref 0.40–1.50)
GFR: 68.03 mL/min (ref >60.00)
Glucose, Bld: 100 mg/dL — ABNORMAL HIGH (ref 70–99)
Potassium: 4.2 mEq/L (ref 3.5–5.1)
SODIUM: 138 meq/L (ref 135–145)

## 2014-09-29 LAB — HEMOGLOBIN A1C: Hgb A1c MFr Bld: 5.3 % (ref 4.6–6.5)

## 2014-09-30 ENCOUNTER — Encounter: Payer: Self-pay | Admitting: Internal Medicine

## 2014-11-05 ENCOUNTER — Ambulatory Visit (INDEPENDENT_AMBULATORY_CARE_PROVIDER_SITE_OTHER): Payer: BC Managed Care – PPO | Admitting: Internal Medicine

## 2014-11-05 ENCOUNTER — Encounter: Payer: Self-pay | Admitting: Internal Medicine

## 2014-11-05 VITALS — BP 111/74 | HR 68 | Temp 98.0°F | Ht 67.0 in | Wt 240.2 lb

## 2014-11-05 DIAGNOSIS — E7800 Pure hypercholesterolemia, unspecified: Secondary | ICD-10-CM

## 2014-11-05 DIAGNOSIS — Z1211 Encounter for screening for malignant neoplasm of colon: Secondary | ICD-10-CM | POA: Diagnosis not present

## 2014-11-05 DIAGNOSIS — E041 Nontoxic single thyroid nodule: Secondary | ICD-10-CM

## 2014-11-05 DIAGNOSIS — Z125 Encounter for screening for malignant neoplasm of prostate: Secondary | ICD-10-CM

## 2014-11-05 DIAGNOSIS — Z0000 Encounter for general adult medical examination without abnormal findings: Secondary | ICD-10-CM

## 2014-11-05 DIAGNOSIS — E669 Obesity, unspecified: Secondary | ICD-10-CM

## 2014-11-05 DIAGNOSIS — I10 Essential (primary) hypertension: Secondary | ICD-10-CM

## 2014-11-05 DIAGNOSIS — R739 Hyperglycemia, unspecified: Secondary | ICD-10-CM

## 2014-11-05 NOTE — Progress Notes (Signed)
Patient ID: [name], male   DOB: [birth year], 49 y.o.   MRN: [id number]   Subjective:    Patient ID: [name], male    DOB: 1966/01/19, 49 y.o.   MRN: [id number]  HPI  Patient here for a scheduled follow up.  Stays active.  Has adjusted his diet.  No cardiac symptoms with increased activity or exertion.  No sob. Eating and drinking well.  Bowels stable.  Cholesterol is much better.  Weight is down.     Past Medical History  Diagnosis Date  . History of chicken pox   . Hypercholesterolemia   . Hypertension     Current Outpatient Prescriptions on File Prior to Visit  Medication Sig Dispense Refill  . aspirin 81 MG tablet Take 81 mg by mouth daily.    Marland Kitchen lisinopril (PRINIVIL,ZESTRIL) 20 MG tablet Take 1 tablet (20 mg total) by mouth daily. 90 tablet 1  . pravastatin (PRAVACHOL) 80 MG tablet Take 1 tablet (80 mg total) by mouth daily. 90 tablet 1   No current facility-administered medications on file prior to visit.    Review of Systems  Constitutional: Negative for fever.       Has lost weight.   HENT: Negative for congestion and sinus pressure.   Respiratory: Negative for cough, chest tightness and shortness of breath.   Cardiovascular: Negative for chest pain, palpitations and leg swelling.  Gastrointestinal: Negative for nausea, vomiting, abdominal pain and diarrhea.  Neurological: Negative for dizziness, light-headedness and headaches.  Psychiatric/Behavioral: Negative for dysphoric mood and agitation.       Objective:    Physical Exam  Constitutional: He appears well-developed and well-nourished. No distress.  HENT:  Nose: Nose normal.  Mouth/Throat: Oropharynx is clear and moist.  Neck: Neck supple.  Cardiovascular: Normal rate and regular rhythm.   Pulmonary/Chest: Effort normal and breath sounds normal. No respiratory distress.  Abdominal: Soft. Bowel sounds are normal. There is no tenderness.  Musculoskeletal: He exhibits no edema or  tenderness.  Lymphadenopathy:    He has no cervical adenopathy.  Skin: No rash noted. No erythema.  Psychiatric: He has a normal mood and affect. His behavior is normal.    BP 111/74 mmHg  Pulse 68  Temp(Src) 98 F (36.7 C) (Oral)  Ht 5\' 7"  (1.702 m)  Wt 240 lb 4 oz (108.977 kg)  BMI 37.62 kg/m2  SpO2 93% Wt Readings from Last 3 Encounters:  11/05/14 240 lb 4 oz (108.977 kg)  04/22/14 247 lb 8 oz (112.265 kg)  11/02/13 246 lb 8 oz (111.812 kg)     Lab Results  Component Value Date   WBC 7.3 04/22/2014   HGB 16.0 04/22/2014   HCT 47.7 04/22/2014   PLT 289.0 04/22/2014   GLUCOSE 100* 09/29/2014   CHOL 167 09/29/2014   TRIG 89.0 09/29/2014   HDL 39.80 09/29/2014   LDLDIRECT 166.3 05/08/2013   LDLCALC 109* 09/29/2014   ALT 35 09/29/2014   AST 23 09/29/2014   NA 138 09/29/2014   K 4.2 09/29/2014   CL 105 09/29/2014   CREATININE 1.20 09/29/2014   BUN 10 09/29/2014   CO2 30 09/29/2014   TSH 0.86 04/22/2014   PSA 0.53 04/22/2014   HGBA1C 5.3 09/29/2014       Assessment & Plan:   Problem List Items Addressed This Visit    Essential hypertension, benign    Blood pressure doing well.  Same medication regimen.  Follow pressures.  Follow metabolic panel.  Relevant Orders   CBC with Differential/Platelet   Basic metabolic panel   Health care maintenance    Physical 04/22/14.  PSA 04/22/14 .53.  Refer to Dr Bary Castilla for screening colonoscopy.        Hypercholesterolemia    On pravastatin.  LDL improved - 109.  Continue diet and exercise.  Follow lipid panel and liver function tests.       Relevant Orders   Lipid panel   Hepatic function panel   Obesity (BMI 30-39.9)    Diet and exercise.       Thyroid nodule    Seeing Dr Eddie Dibbles.  Had negative biopsy.  Is scheduled for f/u thyroid ultrasound next week and f/u with Dr Eddie Dibbles after ultrasound.       Relevant Orders   TSH    Other Visit Diagnoses    Colon cancer screening    -  Primary    Relevant  Orders    Ambulatory referral to General Surgery    Prostate cancer screening        Relevant Orders    PSA    Hyperglycemia        Relevant Orders    Hemoglobin A1c        Einar Pheasant, MD

## 2014-11-05 NOTE — Progress Notes (Signed)
Pre visit review using our clinic review tool, if applicable. No additional management support is needed unless otherwise documented below in the visit note. 

## 2014-11-07 ENCOUNTER — Encounter: Payer: Self-pay | Admitting: Internal Medicine

## 2014-11-07 DIAGNOSIS — Z0000 Encounter for general adult medical examination without abnormal findings: Secondary | ICD-10-CM | POA: Insufficient documentation

## 2014-11-07 NOTE — Assessment & Plan Note (Signed)
Seeing Dr Eddie Dibbles.  Had negative biopsy.  Is scheduled for f/u thyroid ultrasound next week and f/u with Dr Eddie Dibbles after ultrasound.

## 2014-11-07 NOTE — Assessment & Plan Note (Signed)
Blood pressure doing well.  Same medication regimen.  Follow pressures.  Follow metabolic panel.   

## 2014-11-07 NOTE — Assessment & Plan Note (Signed)
On pravastatin.  LDL improved - 109.  Continue diet and exercise.  Follow lipid panel and liver function tests.

## 2014-11-07 NOTE — Assessment & Plan Note (Signed)
Physical 04/22/14.  PSA 04/22/14 .53.  Refer to Dr Bary Castilla for screening colonoscopy.

## 2014-11-07 NOTE — Assessment & Plan Note (Signed)
Diet and exercise.   

## 2014-11-15 ENCOUNTER — Other Ambulatory Visit: Payer: Self-pay

## 2014-11-15 MED ORDER — PRAVASTATIN SODIUM 80 MG PO TABS: 80.0000 mg | ORAL_TABLET | Freq: Every day | ORAL | Status: DC

## 2014-11-18 ENCOUNTER — Telehealth: Payer: Self-pay | Admitting: *Deleted

## 2014-11-18 NOTE — Telephone Encounter (Signed)
Melissa, the referral coordinator at Dr. Randell Patient [name]'s office, was contacted today and notified that multiple attempts have been made to contact this patient to schedule an appointment for an evaluation for screening colonoscopy with no response from the patient.   Lenna Sciara will address this with Dr. Nicki Reaper accordingly.

## 2014-12-06 ENCOUNTER — Other Ambulatory Visit: Payer: Self-pay | Admitting: Internal Medicine

## 2014-12-06 ENCOUNTER — Other Ambulatory Visit: Payer: Self-pay

## 2014-12-06 DIAGNOSIS — E042 Nontoxic multinodular goiter: Secondary | ICD-10-CM

## 2014-12-06 MED ORDER — LISINOPRIL 20 MG PO TABS: 20.0000 mg | ORAL_TABLET | Freq: Every day | ORAL | Status: DC

## 2014-12-13 ENCOUNTER — Ambulatory Visit
Admission: RE | Admit: 2014-12-13 | Discharge: 2014-12-13 | Disposition: A | Discharge status: Home / Self Care | Payer: BC Managed Care – PPO | Source: Ambulatory Visit | Attending: Internal Medicine | Admitting: Internal Medicine

## 2014-12-13 DIAGNOSIS — E042 Nontoxic multinodular goiter: Secondary | ICD-10-CM

## 2014-12-17 ENCOUNTER — Encounter: Payer: Self-pay | Admitting: Internal Medicine

## 2014-12-22 ENCOUNTER — Other Ambulatory Visit: Payer: Self-pay | Admitting: Internal Medicine

## 2014-12-22 DIAGNOSIS — E042 Nontoxic multinodular goiter: Secondary | ICD-10-CM

## 2014-12-30 ENCOUNTER — Encounter: Payer: Self-pay | Admitting: Internal Medicine

## 2015-01-14 ENCOUNTER — Encounter: Payer: Self-pay | Admitting: General Surgery

## 2015-03-08 ENCOUNTER — Ambulatory Visit (INDEPENDENT_AMBULATORY_CARE_PROVIDER_SITE_OTHER): Payer: BC Managed Care – PPO | Admitting: General Surgery

## 2015-03-08 ENCOUNTER — Encounter: Payer: Self-pay | Admitting: General Surgery

## 2015-03-08 VITALS — BP 144/80 | HR 82 | Resp 12 | Ht 69.0 in | Wt 244.0 lb

## 2015-03-08 DIAGNOSIS — Z1211 Encounter for screening for malignant neoplasm of colon: Secondary | ICD-10-CM

## 2015-03-08 MED ORDER — POLYETHYLENE GLYCOL 3350 17 GM/SCOOP PO POWD: 1.0000 | Freq: Once | ORAL | Status: DC

## 2015-03-08 NOTE — Patient Instructions (Addendum)
The patient is aware to call back for any questions or concerns. Colonoscopy A colonoscopy is an exam to look at the entire large intestine (colon). This exam can help find problems such as tumors, polyps, inflammation, and areas of bleeding. The exam takes about 1 hour.  LET [facility] CARE PROVIDER KNOW ABOUT:   Any allergies you have.  All medicines you are taking, including vitamins, herbs, eye drops, creams, and over-the-counter medicines.  Previous problems you or members of your family have had with the use of anesthetics.  Any blood disorders you have.  Previous surgeries you have had.  Medical conditions you have. RISKS AND COMPLICATIONS  Generally, this is a safe procedure. However, as with any procedure, complications can occur. Possible complications include:  Bleeding.  Tearing or rupture of the colon wall.  Reaction to medicines given during the exam.  Infection (rare). BEFORE THE PROCEDURE   Ask your health care provider about changing or stopping your regular medicines.  You may be prescribed an oral bowel prep. This involves drinking a large amount of medicated liquid, starting the day before your procedure. The liquid will cause you to have multiple loose stools until your stool is almost clear or light green. This cleans out your colon in preparation for the procedure.  Do not eat or drink anything else once you have started the bowel prep, unless your health care provider tells you it is safe to do so.  Arrange for someone to drive you home after the procedure. PROCEDURE   You will be given medicine to help you relax (sedative).  You will lie on your side with your knees bent.  A long, flexible tube with a light and camera on the end (colonoscope) will be inserted through the rectum and into the colon. The camera sends video back to a computer screen as it moves through the colon. The colonoscope also releases carbon dioxide gas to inflate the colon. This  helps your health care provider see the area better.  During the exam, your health care provider may take a small tissue sample (biopsy) to be examined under a microscope if any abnormalities are found.  The exam is finished when the entire colon has been viewed. AFTER THE PROCEDURE   Do not drive for 24 hours after the exam.  You may have a small amount of blood in your stool.  You may pass moderate amounts of gas and have mild abdominal cramping or bloating. This is caused by the gas used to inflate your colon during the exam.  Ask when your test results will be ready and how you will get your results. Make sure you get your test results.   This information is not intended to replace advice given to you by your health care provider. Make sure you discuss any questions you have with your health care provider.   Document Released: 04/13/2000 Document Revised: 02/04/2013 Document Reviewed: 12/22/2012 Elsevier Interactive Patient Education [employer].  Patient is scheduled for a Colonoscopy at [facility] on 03/30/15. He is aware they will contact him with an arrival time two days prior. He will only take his Lisinopril at 6 am with a small sip of water the morning of. Miralax prescription has been sent into his pharmacy. Patient is aware of date and instructions.

## 2015-03-08 NOTE — Progress Notes (Signed)
Patient ID: Samuel Last., male   DOB: 17-May-1965, 49 y.o.   MRN: [id number]  Chief Complaint  Patient presents with  . Colonoscopy    HPI Samuel Elliott. is a 49 y.o. male.  Here today to discuss having a colonoscopy, none prior. Denies any gastrointestinal issues. Bowels move regular and daily. Denies reflux issues. I reviewed the history with the patient. HPI  Past Medical History  Diagnosis Date  . History of chicken pox   . Hypercholesterolemia   . Hypertension     Past Surgical History  Procedure Laterality Date  . 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 Hyperlipidemia Mother   . Hypertension Mother   . Hyperlipidemia Father   . Kidney cancer Father   . Hyperlipidemia Paternal Aunt   . Colon cancer Paternal Uncle   . Prostate cancer Paternal Uncle   . Hyperlipidemia Paternal Uncle   . Hyperlipidemia Paternal Grandmother   . Hyperlipidemia Paternal Grandfather     Social History Social History  Substance Use Topics  . Smoking status: Never Smoker   . Smokeless tobacco: Never Used  . Alcohol Use: 0.0 oz/week    0 Standard drinks or equivalent per week     Comment: rarley    No Known Allergies  Current Outpatient Prescriptions  Medication Sig Dispense Refill  . aspirin 81 MG tablet Take 81 mg by mouth daily.    Marland Kitchen lisinopril (PRINIVIL,ZESTRIL) 20 MG tablet Take 1 tablet (20 mg total) by mouth daily. 90 tablet 1  . pravastatin (PRAVACHOL) 80 MG tablet Take 1 tablet (80 mg total) by mouth daily. 90 tablet 1  . polyethylene glycol powder (GLYCOLAX/MIRALAX) powder Take 255 g by mouth once. 255 g 0   No current facility-administered medications for this visit.    Review of Systems Review of Systems  Constitutional: Negative.   Respiratory: Negative.   Cardiovascular: Negative.   Gastrointestinal: Negative for nausea, vomiting, diarrhea and constipation.    Blood  pressure 144/80, pulse 82, resp. rate 12, height 5\' 9"  (1.753 m), weight 244 lb (110.678 kg).  Physical Exam Physical Exam  Constitutional: He is oriented to person, place, and time. He appears well-developed and well-nourished.  HENT:  Mouth/Throat: Oropharynx is clear and moist.  Eyes: Conjunctivae are normal. No scleral icterus.  Neck: Neck supple.  Cardiovascular: Normal rate, regular rhythm and normal heart sounds.   Pulmonary/Chest: Effort normal and breath sounds normal.  Abdominal: Soft. Normal appearance and bowel sounds are normal. There is no tenderness.  Lymphadenopathy:    He has no cervical adenopathy.  Neurological: He is alert and oriented to person, place, and time.  Skin: Skin is warm and dry.  Psychiatric: His behavior is normal.    Data Reviewed PCP note of 11/05/2014 reviewed.  Assessment    Candidate for screening colonoscopy.    Plan        Colonoscopy with possible biopsy/polypectomy prn: Information regarding the procedure, including its potential risks and complications (including but not limited to perforation of the bowel, which may require emergency surgery to repair, and bleeding) was verbally given to the patient. Educational information regarding lower intestinal endoscopy was given to the patient. Written instructions for how to complete the bowel prep using Miralax were provided. The importance of drinking ample fluids to avoid dehydration as a result of the prep emphasized.  Patient is scheduled for a  Colonoscopy at [facility] on 03/30/15. He is aware they will contact him with an arrival time two days prior. He will only take his Lisinopril at 6 am with a small sip of water the morning of. Miralax prescription has been sent into his pharmacy. Patient is aware of date and instructions.    PCP:  Nash Shearer 03/09/2015, 6:18 AM

## 2015-03-09 DIAGNOSIS — Z1211 Encounter for screening for malignant neoplasm of colon: Secondary | ICD-10-CM | POA: Insufficient documentation

## 2015-03-09 NOTE — H&P (Signed)
HPI  Samuel Elliott. is a 49 y.o. male. Here today to discuss having a colonoscopy, none prior. Denies any gastrointestinal issues. Bowels move regular and daily.  Denies reflux issues.  I reviewed the history with the patient.  HPI  Past Medical History   Diagnosis  Date   .  History of chicken pox    .  Hypercholesterolemia    .  Hypertension     Past Surgical History   Procedure  Laterality  Date   .  Tumor removal   7/08 & 5/14     left shoulder, lipoma    Family History   Problem  Relation  Age of Onset   .  Hyperlipidemia  Mother    .  Hypertension  Mother    .  Hyperlipidemia  Father    .  Kidney cancer  Father    .  Hyperlipidemia  Paternal Aunt    .  Colon cancer  Paternal Uncle    .  Prostate cancer  Paternal Uncle    .  Hyperlipidemia  Paternal Uncle    .  Hyperlipidemia  Paternal Grandmother    .  Hyperlipidemia  Paternal Grandfather     Social History  Social History   Substance Use Topics   .  Smoking status:  Never Smoker   .  Smokeless tobacco:  Never Used   .  Alcohol Use:  0.0 oz/week     0 Standard drinks or equivalent per week      Comment: rarley    No Known Allergies  Current Outpatient Prescriptions   Medication  Sig  Dispense  Refill   .  aspirin 81 MG tablet  Take 81 mg by mouth daily.     Marland Kitchen  lisinopril (PRINIVIL,ZESTRIL) 20 MG tablet  Take 1 tablet (20 mg total) by mouth daily.  90 tablet  1   .  pravastatin (PRAVACHOL) 80 MG tablet  Take 1 tablet (80 mg total) by mouth daily.  90 tablet  1   .  polyethylene glycol powder (GLYCOLAX/MIRALAX) powder  Take 255 g by mouth once.  255 g  0    No current facility-administered medications for this visit.    Review of Systems  Review of Systems  Constitutional: Negative.  Respiratory: Negative.  Cardiovascular: Negative.  Gastrointestinal: Negative for nausea, vomiting, diarrhea and constipation.   Blood pressure 144/80, pulse 82, resp. rate 12, height 5\' 9"  (1.753 m), weight 244 lb  (110.678 kg).  Physical Exam  Physical Exam  Constitutional: He is oriented to person, place, and time. He appears well-developed and well-nourished.  HENT:  Mouth/Throat: Oropharynx is clear and moist.  Eyes: Conjunctivae are normal. No scleral icterus.  Neck: Neck supple.  Cardiovascular: Normal rate, regular rhythm and normal heart sounds.  Pulmonary/Chest: Effort normal and breath sounds normal.  Abdominal: Soft. Normal appearance and bowel sounds are normal. There is no tenderness.  Lymphadenopathy:  He has no cervical adenopathy.  Neurological: He is alert and oriented to person, place, and time.  Skin: Skin is warm and dry.  Psychiatric: His behavior is normal.   Data Reviewed  PCP note of 11/05/2014 reviewed.  Assessment   Candidate for screening colonoscopy.   Plan    Colonoscopy with possible biopsy/polypectomy prn: Information regarding the procedure, including its potential risks and complications (including but not limited to perforation of the bowel, which may require emergency surgery to repair, and bleeding) was verbally given to the patient. Educational  information regarding lower intestinal endoscopy was given to the patient. Written instructions for how to complete the bowel prep using Miralax were provided. The importance of drinking ample fluids to avoid dehydration as a result of the prep emphasized.  Patient is scheduled for a Colonoscopy at [facility] on 03/30/15. He is aware they will contact him with an arrival time two days prior. He will only take his Lisinopril at 6 am with a small sip of water the morning of. Miralax prescription has been sent into his pharmacy. Patient is aware of date and instructions.  PCP: Nash Shearer  03/09/2015, 6:18 AM

## 2015-03-14 ENCOUNTER — Ambulatory Visit: Payer: BC Managed Care – PPO | Admitting: General Surgery

## 2015-03-30 ENCOUNTER — Encounter: Payer: Self-pay | Admitting: General Surgery

## 2015-03-30 DIAGNOSIS — Z1211 Encounter for screening for malignant neoplasm of colon: Secondary | ICD-10-CM | POA: Diagnosis not present

## 2015-04-01 ENCOUNTER — Other Ambulatory Visit (INDEPENDENT_AMBULATORY_CARE_PROVIDER_SITE_OTHER): Payer: BC Managed Care – PPO

## 2015-04-01 DIAGNOSIS — E7800 Pure hypercholesterolemia, unspecified: Secondary | ICD-10-CM

## 2015-04-01 DIAGNOSIS — Z125 Encounter for screening for malignant neoplasm of prostate: Secondary | ICD-10-CM | POA: Diagnosis not present

## 2015-04-01 DIAGNOSIS — E041 Nontoxic single thyroid nodule: Secondary | ICD-10-CM | POA: Diagnosis not present

## 2015-04-01 DIAGNOSIS — I10 Essential (primary) hypertension: Secondary | ICD-10-CM

## 2015-04-01 DIAGNOSIS — R739 Hyperglycemia, unspecified: Secondary | ICD-10-CM

## 2015-04-01 LAB — CBC WITH DIFFERENTIAL/PLATELET
BASOS ABS: 0 10*3/uL (ref 0.0–0.1)
Basophils Relative: 0.8 % (ref 0.0–3.0)
EOS ABS: 0.2 10*3/uL (ref 0.0–0.7)
Eosinophils Relative: 2.9 % (ref 0.0–5.0)
HCT: 45.8 % (ref 39.0–52.0)
Hemoglobin: 15.7 g/dL (ref 13.0–17.0)
LYMPHS ABS: 1.7 10*3/uL (ref 0.7–4.0)
Lymphocytes Relative: 30.6 % (ref 12.0–46.0)
MCHC: 34.3 g/dL (ref 30.0–36.0)
MCV: 84.8 fl (ref 78.0–100.0)
MONO ABS: 0.5 10*3/uL (ref 0.1–1.0)
Monocytes Relative: 8.2 % (ref 3.0–12.0)
NEUTROS ABS: 3.3 10*3/uL (ref 1.4–7.7)
NEUTROS PCT: 57.5 % (ref 43.0–77.0)
PLATELETS: 263 10*3/uL (ref 150.0–400.0)
RBC: 5.41 Mil/uL (ref 4.22–5.81)
RDW: 13.1 % (ref 11.5–15.5)
WBC: 5.7 10*3/uL (ref 4.0–10.5)

## 2015-04-01 LAB — BASIC METABOLIC PANEL
BUN: 13 mg/dL (ref 6–23)
CO2: 29 mEq/L (ref 19–32)
Calcium: 10.2 mg/dL (ref 8.4–10.5)
Chloride: 105 mEq/L (ref 96–112)
Creatinine, Ser: 1.07 mg/dL (ref 0.40–1.50)
GFR: 77.5 mL/min (ref >60.00)
GLUCOSE: 89 mg/dL (ref 70–99)
POTASSIUM: 4.4 meq/L (ref 3.5–5.1)
Sodium: 140 mEq/L (ref 135–145)

## 2015-04-01 LAB — LIPID PANEL
CHOL/HDL RATIO: 5
CHOLESTEROL: 179 mg/dL (ref 0–200)
HDL: 37.7 mg/dL — ABNORMAL LOW (ref >39.00)
LDL Cholesterol: 120 mg/dL — ABNORMAL HIGH (ref 0–99)
NonHDL: 141.27
TRIGLYCERIDES: 104 mg/dL (ref 0.0–149.0)
VLDL: 20.8 mg/dL (ref 0.0–40.0)

## 2015-04-01 LAB — HEPATIC FUNCTION PANEL
ALK PHOS: 82 U/L (ref 39–117)
ALT: 26 U/L (ref 0–53)
AST: 16 U/L (ref 0–37)
Albumin: 3.8 g/dL (ref 3.5–5.2)
BILIRUBIN DIRECT: 0.1 mg/dL (ref 0.0–0.3)
TOTAL PROTEIN: 6.6 g/dL (ref 6.0–8.3)
Total Bilirubin: 0.6 mg/dL (ref 0.2–1.2)

## 2015-04-01 LAB — HEMOGLOBIN A1C: Hgb A1c MFr Bld: 5.5 % (ref 4.6–6.5)

## 2015-04-01 LAB — TSH: TSH: 0.8 u[IU]/mL (ref 0.35–4.50)

## 2015-04-01 LAB — PSA: PSA: 0.34 ng/mL (ref 0.10–4.00)

## 2015-04-04 ENCOUNTER — Encounter: Payer: Self-pay | Admitting: Internal Medicine

## 2015-04-25 ENCOUNTER — Other Ambulatory Visit: Payer: Self-pay | Admitting: Internal Medicine

## 2015-05-09 ENCOUNTER — Ambulatory Visit (INDEPENDENT_AMBULATORY_CARE_PROVIDER_SITE_OTHER): Payer: BC Managed Care – PPO | Admitting: Internal Medicine

## 2015-05-09 ENCOUNTER — Encounter: Payer: BC Managed Care – PPO | Admitting: Internal Medicine

## 2015-05-09 ENCOUNTER — Encounter: Payer: Self-pay | Admitting: Internal Medicine

## 2015-05-09 VITALS — BP 120/70 | HR 65 | Temp 98.5°F | Resp 18 | Ht 67.5 in | Wt 235.8 lb

## 2015-05-09 DIAGNOSIS — M6740 Ganglion, unspecified site: Secondary | ICD-10-CM

## 2015-05-09 DIAGNOSIS — E041 Nontoxic single thyroid nodule: Secondary | ICD-10-CM

## 2015-05-09 DIAGNOSIS — E7800 Pure hypercholesterolemia, unspecified: Secondary | ICD-10-CM

## 2015-05-09 DIAGNOSIS — Z0000 Encounter for general adult medical examination without abnormal findings: Secondary | ICD-10-CM | POA: Diagnosis not present

## 2015-05-09 DIAGNOSIS — I10 Essential (primary) hypertension: Secondary | ICD-10-CM | POA: Diagnosis not present

## 2015-05-09 DIAGNOSIS — E669 Obesity, unspecified: Secondary | ICD-10-CM

## 2015-05-09 DIAGNOSIS — R739 Hyperglycemia, unspecified: Secondary | ICD-10-CM

## 2015-05-09 NOTE — Progress Notes (Signed)
Patient ID: Samuel Last., male   DOB: Nov 02, 1965, 50 y.o.   MRN: [id number]   Subjective:    Patient ID: Samuel Last., male    DOB: May 14, 1965, 50 y.o.   MRN: [id number]  HPI  Patient with past history of hypercholesterolemia and hypertension.  He comes in today to follow up on these issues as well as for a complete physical exam.  He tries to stay active.  Discussed diet and exercise.  No chest pain or tightness.  No sob.  No acid reflux.  No abdominal pain or cramping.  Bowels stable.  Had viral illness 04/11/15.  Resolved.  Has ganglion cyst - right hand.     Past Medical History  Diagnosis Date  . History of chicken pox   . Hypercholesterolemia   . Hypertension    Past Surgical History  Procedure Laterality Date  . Tumor removal  7/08 & 5/14    left shoulder, lipoma   Family History  Problem Relation Age of Onset  . Hyperlipidemia Mother   . Hypertension Mother   . Hyperlipidemia Father   . Kidney cancer Father   . Hyperlipidemia Paternal Aunt   . Colon cancer Paternal Uncle   . Prostate cancer Paternal Uncle   . Hyperlipidemia Paternal Uncle   . Hyperlipidemia Paternal Grandmother   . Hyperlipidemia Paternal Grandfather    Social History   Social History  . Marital Status: Married    Spouse Name: N/A  . Number of Children: N/A  . Years of Education: N/A   Social History Main Topics  . Smoking status: Never Smoker   . Smokeless tobacco: Never Used  . Alcohol Use: 0.0 oz/week    0 Standard drinks or equivalent per week     Comment: rarley  . Drug Use: No  . Sexual Activity: Not Asked   Other Topics Concern  . None   Social History Narrative    Outpatient Encounter Prescriptions as of 05/09/2015  Medication Sig  . aspirin 81 MG tablet Take 81 mg by mouth daily.  Marland Kitchen lisinopril (PRINIVIL,ZESTRIL) 20 MG tablet Take 1 tablet (20 mg total) by mouth daily.  . polyethylene glycol powder (GLYCOLAX/MIRALAX) powder Take 255 g by mouth once.  .  pravastatin (PRAVACHOL) 80 MG tablet Take 1 tablet (80 mg total) by mouth daily.   No facility-administered encounter medications on file as of 05/09/2015.    Review of Systems  Constitutional: Negative for appetite change and unexpected weight change.  HENT: Negative for congestion and sinus pressure.   Eyes: Negative for pain and visual disturbance.  Respiratory: Negative for cough, chest tightness and shortness of breath.   Cardiovascular: Negative for chest pain, palpitations and leg swelling.  Gastrointestinal: Negative for nausea, vomiting, abdominal pain and diarrhea.  Genitourinary: Negative for dysuria and difficulty urinating.  Musculoskeletal: Negative for back pain and joint swelling.       Ganglion cyst present.   Skin: Negative for color change and rash.  Neurological: Negative for dizziness, light-headedness and headaches.  Hematological: Negative for adenopathy. Does not bruise/bleed easily.  Psychiatric/Behavioral: Negative for dysphoric mood and agitation.       Objective:    Physical Exam  Constitutional: He is oriented to person, place, and time. He appears well-developed and well-nourished. No distress.  HENT:  Head: Normocephalic and atraumatic.  Nose: Nose normal.  Mouth/Throat: Oropharynx is clear and moist. No oropharyngeal exudate.  Eyes: Conjunctivae are normal. Right eye exhibits no discharge. Left eye exhibits  no discharge.  Neck: Neck supple. No thyromegaly present.  Cardiovascular: Normal rate and regular rhythm.   Pulmonary/Chest: Breath sounds normal. No respiratory distress. He has no wheezes.  Abdominal: Soft. Bowel sounds are normal. There is no tenderness.  Genitourinary:  Rectal exam - no palpable prostate nodule.  Heme negative.    Musculoskeletal: He exhibits no edema or tenderness.  Lymphadenopathy:    He has no cervical adenopathy.  Neurological: He is alert and oriented to person, place, and time.  Skin: Skin is warm and dry. No rash  noted. No erythema.  Psychiatric: He has a normal mood and affect. His behavior is normal.    BP 120/70 mmHg  Pulse 65  Temp(Src) 98.5 F (36.9 C) (Oral)  Resp 18  Ht 5' 7.5" (1.715 m)  Wt 235 lb 12 oz (106.935 kg)  BMI 36.36 kg/m2  SpO2 94% Wt Readings from Last 3 Encounters:  05/09/15 235 lb 12 oz (106.935 kg)  03/08/15 244 lb (110.678 kg)  11/05/14 240 lb 4 oz (108.977 kg)     Lab Results  Component Value Date   WBC 5.7 04/01/2015   HGB 15.7 04/01/2015   HCT 45.8 04/01/2015   PLT 263.0 04/01/2015   GLUCOSE 89 04/01/2015   CHOL 179 04/01/2015   TRIG 104.0 04/01/2015   HDL 37.70* 04/01/2015   LDLDIRECT 166.3 05/08/2013   LDLCALC 120* 04/01/2015   ALT 26 04/01/2015   AST 16 04/01/2015   NA 140 04/01/2015   K 4.4 04/01/2015   CL 105 04/01/2015   CREATININE 1.07 04/01/2015   BUN 13 04/01/2015   CO2 29 04/01/2015   TSH 0.80 04/01/2015   PSA 0.34 04/01/2015   HGBA1C 5.5 04/01/2015    US Soft Tissue Head/neck  12/13/2014  CLINICAL DATA:  50 year old male with a history of thyroid nodules. EXAM: THYROID ULTRASOUND TECHNIQUE: Ultrasound examination of the thyroid gland and adjacent soft tissues was performed. COMPARISON:  04/27/2014 FINDINGS: Right thyroid lobe Measurements: 5.8 cm x 2.8 cm x 3.1 cm. Heterogeneous appearance of the right thyroid tissue, similar to the comparison. No significant increased flow. Nodular region at the inferior right thyroid measures 2.5 cm x 1.8 cm x 3.3 cm. It is uncertain if this is a pseudo nodule or a true nodule. Right superior nodule measures 1.5 cm x 9 mm by 1.7 cm with partially cystic and partially solid features, with a cystic features developing in the interval. No internal reflectors. Left thyroid lobe Measurements: 5.7 cm x 2.6 cm x 3.1 cm. Heterogeneous appearance of the left thyroid without significantly increased flow. Dominant nodule on the left is inferior measuring 3.9 cm x 2.4 cm x 3.8 cm. Given the appearance it is uncertain  if this is a pseudo nodule or true nodule. No internal reflectors. Isthmus Thickness: 4 mm.  No nodules visualized. Lymphadenopathy None visualized. IMPRESSION: Multinodular thyroid. Dominant left and dominant right nodules meet size criteria for biopsy, however, it is uncertain on this study if these are true nodules or pseudo nodules. Findings meet consensus criteria for biopsy. Ultrasound-guided fine needle aspiration should be considered, as per the consensus statement: Management of Thyroid Nodules Detected at [place]: Society of Radiologists in [place]. Radiology 2005; [id number]. The right superior nodule has developed internal cystic changes, compatible with a degenerative nodule, and does not meet criteria for biopsy on the current study. Signed, Dulcy Fanny. Earleen Newport, DO [facility] Radiology Electronically Signed   By: Corrie Mckusick D.O.  On: 12/13/2014 14:04       Assessment & Plan:   Problem List Items Addressed This Visit    Essential hypertension, benign    Blood pressure under good control.  Continue same medication regimen.  Follow pressures.  Follow metabolic panel.        Relevant Orders   Basic metabolic panel   Ganglion cyst    Ganglion cyst right hand.  Refer to ortho.        Relevant Orders   Ambulatory referral to Orthopedic Surgery   Health care maintenance    Physical today 05/09/15.  PSA 04/01/15 - .34.  Colonoscopy 03/30/15 - diverticulosis.        Hypercholesterolemia    Low cholesterol diet and exercise.  Follow lipid panel and liver function tests.  On pravastatin.       Relevant Orders   Lipid panel   Hepatic function panel   Obesity (BMI 30-39.9)    Diet and exercise.  Follow.       Thyroid nodule    Seeing Dr Eddie Dibbles.  Negative biopsy.         Other Visit Diagnoses    Routine general medical examination at a health care facility    -  Primary    Hyperglycemia        Relevant  Orders    Hemoglobin A1c        Einar Pheasant, MD

## 2015-05-15 ENCOUNTER — Encounter: Payer: Self-pay | Admitting: Internal Medicine

## 2015-05-15 DIAGNOSIS — M6740 Ganglion, unspecified site: Secondary | ICD-10-CM | POA: Insufficient documentation

## 2015-05-15 NOTE — Assessment & Plan Note (Signed)
Diet and exercise.  Follow.  

## 2015-05-15 NOTE — Assessment & Plan Note (Signed)
Blood pressure under good control.  Continue same medication regimen.  Follow pressures.  Follow metabolic panel.   

## 2015-05-15 NOTE — Assessment & Plan Note (Signed)
Physical today 05/09/15.  PSA 04/01/15 - .34.  Colonoscopy 03/30/15 - diverticulosis.

## 2015-05-15 NOTE — Assessment & Plan Note (Signed)
Seeing Dr Eddie Dibbles.  Negative biopsy.

## 2015-05-15 NOTE — Assessment & Plan Note (Signed)
Ganglion cyst right hand.  Refer to ortho.

## 2015-05-15 NOTE — Assessment & Plan Note (Signed)
Low cholesterol diet and exercise.  Follow lipid panel and liver function tests.  On pravastatin.   

## 2015-08-29 ENCOUNTER — Other Ambulatory Visit: Payer: Self-pay | Admitting: Internal Medicine

## 2015-11-04 ENCOUNTER — Other Ambulatory Visit (INDEPENDENT_AMBULATORY_CARE_PROVIDER_SITE_OTHER): Payer: BC Managed Care – PPO

## 2015-11-04 DIAGNOSIS — R739 Hyperglycemia, unspecified: Secondary | ICD-10-CM

## 2015-11-04 DIAGNOSIS — E7800 Pure hypercholesterolemia, unspecified: Secondary | ICD-10-CM | POA: Diagnosis not present

## 2015-11-04 DIAGNOSIS — I10 Essential (primary) hypertension: Secondary | ICD-10-CM | POA: Diagnosis not present

## 2015-11-04 LAB — LIPID PANEL
CHOLESTEROL: 185 mg/dL (ref 0–200)
HDL: 41.7 mg/dL (ref >39.00)
LDL Cholesterol: 120 mg/dL — ABNORMAL HIGH (ref 0–99)
NONHDL: 143.08
Total CHOL/HDL Ratio: 4
Triglycerides: 114 mg/dL (ref 0.0–149.0)
VLDL: 22.8 mg/dL (ref 0.0–40.0)

## 2015-11-04 LAB — HEPATIC FUNCTION PANEL
ALBUMIN: 4.1 g/dL (ref 3.5–5.2)
ALT: 26 U/L (ref 0–53)
AST: 18 U/L (ref 0–37)
Alkaline Phosphatase: 85 U/L (ref 39–117)
Bilirubin, Direct: 0.1 mg/dL (ref 0.0–0.3)
Total Bilirubin: 0.6 mg/dL (ref 0.2–1.2)
Total Protein: 6.6 g/dL (ref 6.0–8.3)

## 2015-11-04 LAB — BASIC METABOLIC PANEL
BUN: 13 mg/dL (ref 6–23)
CALCIUM: 10.5 mg/dL (ref 8.4–10.5)
CHLORIDE: 105 meq/L (ref 96–112)
CO2: 30 mEq/L (ref 19–32)
CREATININE: 1.08 mg/dL (ref 0.40–1.50)
GFR: 76.49 mL/min (ref >60.00)
Glucose, Bld: 95 mg/dL (ref 70–99)
Potassium: 4.5 mEq/L (ref 3.5–5.1)
Sodium: 139 mEq/L (ref 135–145)

## 2015-11-04 LAB — HEMOGLOBIN A1C: HEMOGLOBIN A1C: 5.3 % (ref 4.6–6.5)

## 2015-11-06 ENCOUNTER — Encounter: Payer: Self-pay | Admitting: Internal Medicine

## 2015-11-08 ENCOUNTER — Other Ambulatory Visit: Payer: Self-pay | Admitting: Surgical

## 2015-11-08 ENCOUNTER — Ambulatory Visit (INDEPENDENT_AMBULATORY_CARE_PROVIDER_SITE_OTHER): Payer: BC Managed Care – PPO | Admitting: Internal Medicine

## 2015-11-08 ENCOUNTER — Encounter: Payer: Self-pay | Admitting: Internal Medicine

## 2015-11-08 VITALS — BP 120/80 | HR 60 | Temp 98.5°F | Resp 18 | Ht 67.5 in | Wt 241.1 lb

## 2015-11-08 DIAGNOSIS — M6740 Ganglion, unspecified site: Secondary | ICD-10-CM | POA: Diagnosis not present

## 2015-11-08 DIAGNOSIS — E669 Obesity, unspecified: Secondary | ICD-10-CM

## 2015-11-08 DIAGNOSIS — E041 Nontoxic single thyroid nodule: Secondary | ICD-10-CM | POA: Diagnosis not present

## 2015-11-08 DIAGNOSIS — I10 Essential (primary) hypertension: Secondary | ICD-10-CM

## 2015-11-08 DIAGNOSIS — E7800 Pure hypercholesterolemia, unspecified: Secondary | ICD-10-CM

## 2015-11-08 MED ORDER — ROSUVASTATIN CALCIUM 20 MG PO TABS: 20.0000 mg | ORAL_TABLET | Freq: Every day | ORAL | Status: DC

## 2015-11-08 NOTE — Assessment & Plan Note (Signed)
Diet and exercise.   

## 2015-11-08 NOTE — Assessment & Plan Note (Signed)
LDL just checked - 120.  Stable.  Will change to crestor 20mg  and see if can get LDL lower.  Continue low cholesterol diet and exercise.  Follow lipid panel and liver function tests.

## 2015-11-08 NOTE — Assessment & Plan Note (Signed)
Saw ortho.  See note.  Better.  No problems now.  Follow.

## 2015-11-08 NOTE — Assessment & Plan Note (Signed)
Sees Dr Eddie Dibbles.  Due f/u with her and f/u ultrasound in 11/2014.

## 2015-11-08 NOTE — Progress Notes (Signed)
Patient ID: [name], male   DOB: 1965-10-28, 50 y.o.   MRN: [id number]   Subjective:    Patient ID: [name], male    DOB: Jan 18, 1966, 50 y.o.   MRN: [id number]  HPI  Patient here for a scheduled follow up.  He is doing well.  Feels good. Staying active.  Has not been exercising as much, but has been doing increased physical work.  No chest pain.  No sob.  No acid reflux reported.  No abdominal pain or cramping.  Bowels stable.  Due f/u with endocrinology in 11/2014.     Past Medical History  Diagnosis Date  . History of chicken pox   . Hypercholesterolemia   . Hypertension    Past Surgical History  Procedure Laterality Date  . Tumor removal  7/08 & 5/14    left shoulder, lipoma   Family History  Problem Relation Age of Onset  . Hyperlipidemia Mother   . Hypertension Mother   . Hyperlipidemia Father   . Kidney cancer Father   . Hyperlipidemia Paternal Aunt   . Colon cancer Paternal Uncle   . Prostate cancer Paternal Uncle   . Hyperlipidemia Paternal Uncle   . Hyperlipidemia Paternal Grandmother   . Hyperlipidemia Paternal Grandfather    Social History   Social History  . Marital Status: Married    Spouse Name: N/A  . Number of Children: N/A  . Years of Education: N/A   Social History Main Topics  . Smoking status: Never Smoker   . Smokeless tobacco: Never Used  . Alcohol Use: 0.0 oz/week    0 Standard drinks or equivalent per week     Comment: rarley  . Drug Use: No  . Sexual Activity: Not Asked   Other Topics Concern  . None   Social History Narrative    Outpatient Encounter Prescriptions as of 11/08/2015  Medication Sig  . aspirin 81 MG tablet Take 81 mg by mouth daily.  Marland Kitchen lisinopril (PRINIVIL,ZESTRIL) 20 MG tablet Take 1 tablet (20 mg total) by mouth daily.  . polyethylene glycol powder (GLYCOLAX/MIRALAX) powder Take 255 g by mouth once.  . [DISCONTINUED] pravastatin (PRAVACHOL) 80 MG tablet Take 1 tablet (80 mg total) by  mouth daily.  . rosuvastatin (CRESTOR) 20 MG tablet Take 1 tablet (20 mg total) by mouth daily.   No facility-administered encounter medications on file as of 11/08/2015.    Review of Systems  Constitutional: Negative for appetite change and unexpected weight change.  HENT: Negative for congestion and sinus pressure.   Respiratory: Negative for cough, chest tightness and shortness of breath.   Cardiovascular: Negative for chest pain, palpitations and leg swelling.  Gastrointestinal: Negative for nausea, vomiting, abdominal pain and diarrhea.  Genitourinary: Negative for dysuria and difficulty urinating.  Musculoskeletal: Negative for back pain and joint swelling.  Skin: Negative for color change and rash.  Neurological: Negative for dizziness, light-headedness and headaches.  Psychiatric/Behavioral: Negative for dysphoric mood and agitation.       Objective:     Blood pressure rechecked by me:  114/72  Physical Exam  Constitutional: He appears well-developed and well-nourished. No distress.  HENT:  Nose: Nose normal.  Mouth/Throat: Oropharynx is clear and moist.  Neck: Neck supple. No thyromegaly present.  Cardiovascular: Normal rate and regular rhythm.   Pulmonary/Chest: Effort normal and breath sounds normal. No respiratory distress.  Abdominal: Soft. Bowel sounds are normal. There is no tenderness.  Musculoskeletal: He exhibits no edema or tenderness.  Lymphadenopathy:    He has no cervical adenopathy.  Skin: No rash noted. No erythema.  Psychiatric: He has a normal mood and affect. His behavior is normal.    BP 120/80 mmHg  Pulse 60  Temp(Src) 98.5 F (36.9 C) (Oral)  Resp 18  Ht 5' 7.5" (1.715 m)  Wt 241 lb 2 oz (109.374 kg)  BMI 37.19 kg/m2  SpO2 94% Wt Readings from Last 3 Encounters:  11/08/15 241 lb 2 oz (109.374 kg)  05/09/15 235 lb 12 oz (106.935 kg)  03/08/15 244 lb (110.678 kg)     Lab Results  Component Value Date   WBC 5.7 04/01/2015   HGB 15.7  04/01/2015   HCT 45.8 04/01/2015   PLT 263.0 04/01/2015   GLUCOSE 95 11/04/2015   CHOL 185 11/04/2015   TRIG 114.0 11/04/2015   HDL 41.70 11/04/2015   LDLDIRECT 166.3 05/08/2013   LDLCALC 120* 11/04/2015   ALT 26 11/04/2015   AST 18 11/04/2015   NA 139 11/04/2015   K 4.5 11/04/2015   CL 105 11/04/2015   CREATININE 1.08 11/04/2015   BUN 13 11/04/2015   CO2 30 11/04/2015   TSH 0.80 04/01/2015   PSA 0.34 04/01/2015   HGBA1C 5.3 11/04/2015    US Soft Tissue Head/neck  12/13/2014  CLINICAL DATA:  50 year old male with a history of thyroid nodules. EXAM: THYROID ULTRASOUND TECHNIQUE: Ultrasound examination of the thyroid gland and adjacent soft tissues was performed. COMPARISON:  04/27/2014 FINDINGS: Right thyroid lobe Measurements: 5.8 cm x 2.8 cm x 3.1 cm. Heterogeneous appearance of the right thyroid tissue, similar to the comparison. No significant increased flow. Nodular region at the inferior right thyroid measures 2.5 cm x 1.8 cm x 3.3 cm. It is uncertain if this is a pseudo nodule or a true nodule. Right superior nodule measures 1.5 cm x 9 mm by 1.7 cm with partially cystic and partially solid features, with a cystic features developing in the interval. No internal reflectors. Left thyroid lobe Measurements: 5.7 cm x 2.6 cm x 3.1 cm. Heterogeneous appearance of the left thyroid without significantly increased flow. Dominant nodule on the left is inferior measuring 3.9 cm x 2.4 cm x 3.8 cm. Given the appearance it is uncertain if this is a pseudo nodule or true nodule. No internal reflectors. Isthmus Thickness: 4 mm.  No nodules visualized. Lymphadenopathy None visualized. IMPRESSION: Multinodular thyroid. Dominant left and dominant right nodules meet size criteria for biopsy, however, it is uncertain on this study if these are true nodules or pseudo nodules. Findings meet consensus criteria for biopsy. Ultrasound-guided fine needle aspiration should be considered, as per the consensus  statement: Management of Thyroid Nodules Detected at [place]: Society of Radiologists in [place]. Radiology 2005; [id number]. The right superior nodule has developed internal cystic changes, compatible with a degenerative nodule, and does not meet criteria for biopsy on the current study. Signed, Dulcy Fanny. Earleen Newport, DO [facility] Radiology Electronically Signed   By: Corrie Mckusick D.O.   On: 12/13/2014 14:04       Assessment & Plan:   Problem List Items Addressed This Visit    Essential hypertension, benign    Blood pressure under good control.  Continue same medication regimen.  Follow pressures.  Follow metabolic panel.        Relevant Medications   rosuvastatin (CRESTOR) 20 MG tablet   Ganglion cyst - Primary    Saw ortho.  See note.  Better.  No problems now.  Follow.        Hypercholesterolemia    LDL just checked - 120.  Stable.  Will change to crestor 20mg  and see if can get LDL lower.  Continue low cholesterol diet and exercise.  Follow lipid panel and liver function tests.        Relevant Medications   rosuvastatin (CRESTOR) 20 MG tablet   Other Relevant Orders   Hepatic function panel   Obesity (BMI 30-39.9)    Diet and exercise.       Thyroid nodule    Sees Dr Eddie Dibbles.  Due f/u with her and f/u ultrasound in 11/2014.            Einar Pheasant, MD

## 2015-11-08 NOTE — Progress Notes (Signed)
Pre-visit discussion using our clinic review tool. No additional management support is needed unless otherwise documented below in the visit note.  

## 2015-11-08 NOTE — Assessment & Plan Note (Signed)
Blood pressure under good control.  Continue same medication regimen.  Follow pressures.  Follow metabolic panel.   

## 2015-12-07 ENCOUNTER — Ambulatory Visit
Admission: RE | Admit: 2015-12-07 | Discharge: 2015-12-07 | Disposition: A | Discharge status: Home / Self Care | Payer: BC Managed Care – PPO | Source: Ambulatory Visit | Attending: Internal Medicine | Admitting: Internal Medicine

## 2015-12-07 DIAGNOSIS — E042 Nontoxic multinodular goiter: Secondary | ICD-10-CM

## 2015-12-17 IMAGING — US THYROID ULTRASOUND
1 series · 13 of 25 positions shown · non-contrast
Comparison: None.

CLINICAL DATA: Enlarged thyroid.

EXAM:
THYROID ULTRASOUND
TECHNIQUE: Ultrasound examination of the thyroid gland and adjacent soft
tissues was performed.

[Series 1: thyroid ultrasound · 0.08mm/px · 13 of 76 slices shown]
[im 1/76]
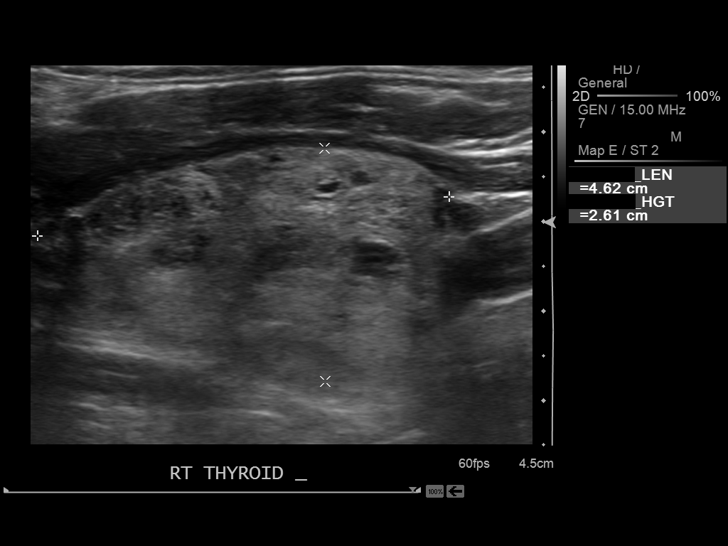
[im 7/76]
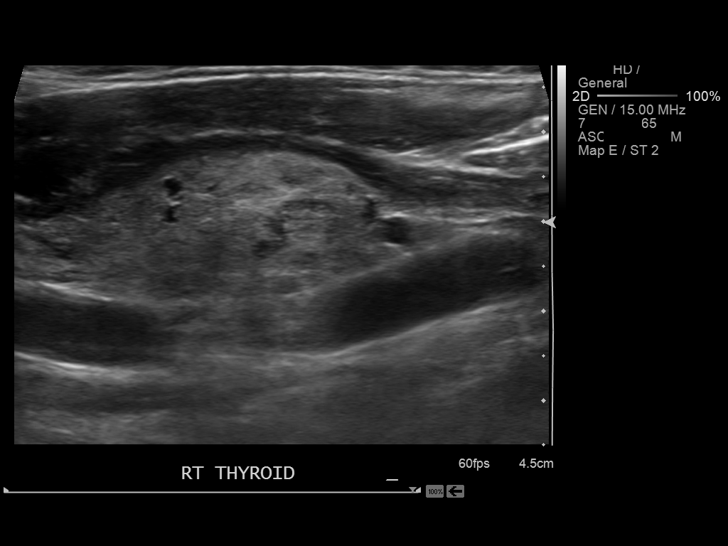
[im 13/76]
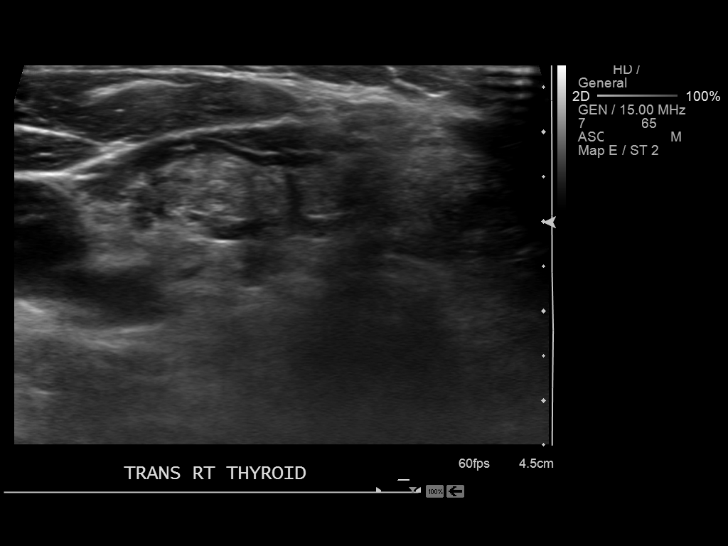
[im 19/76]
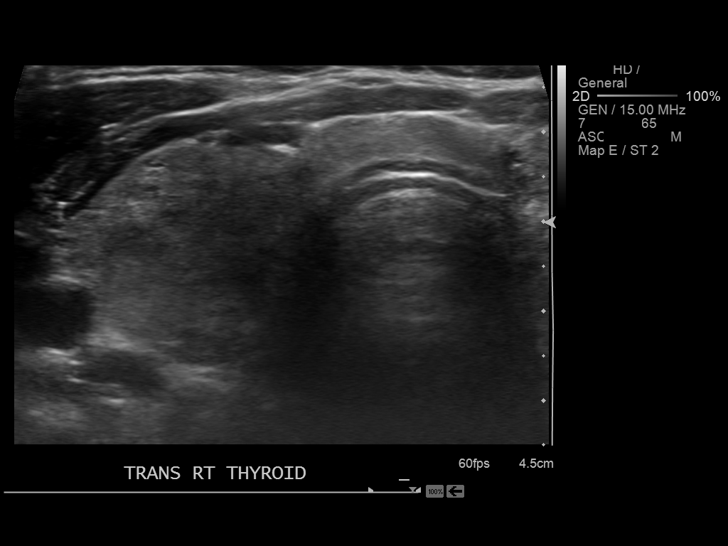
[im 26/76]
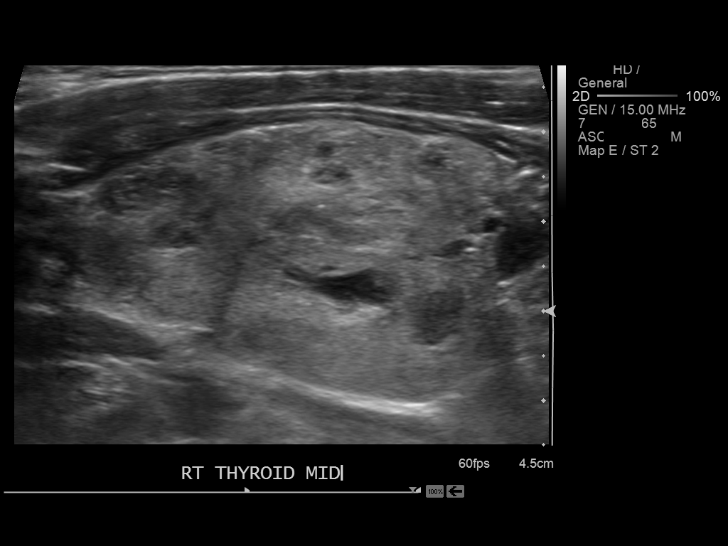
[im 32/76]
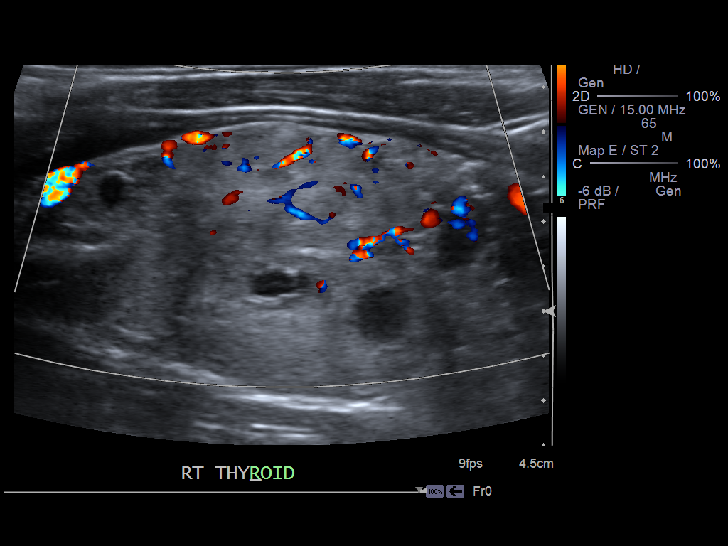
[im 38/76]
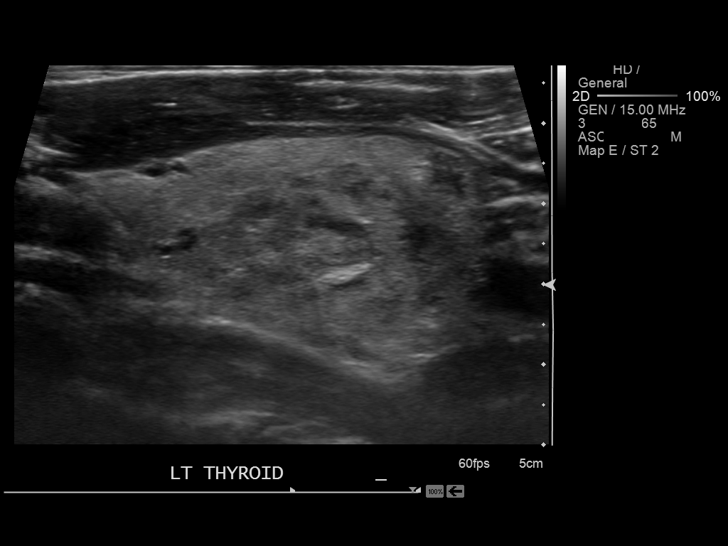
[im 44/76]
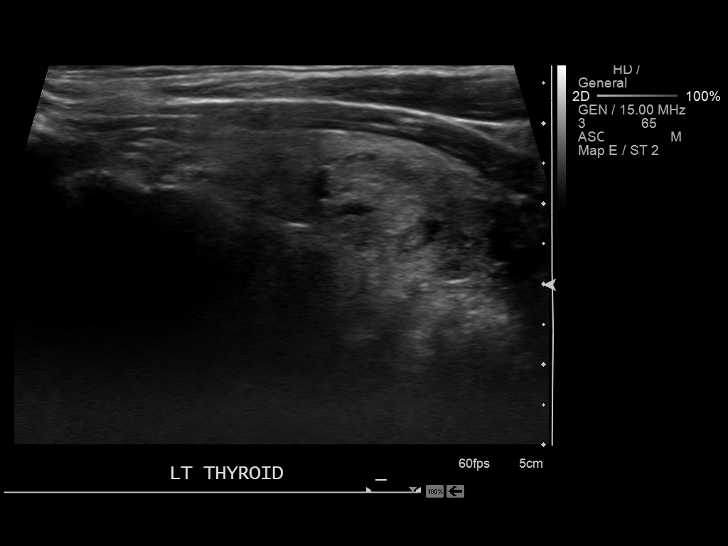
[im 51/76]
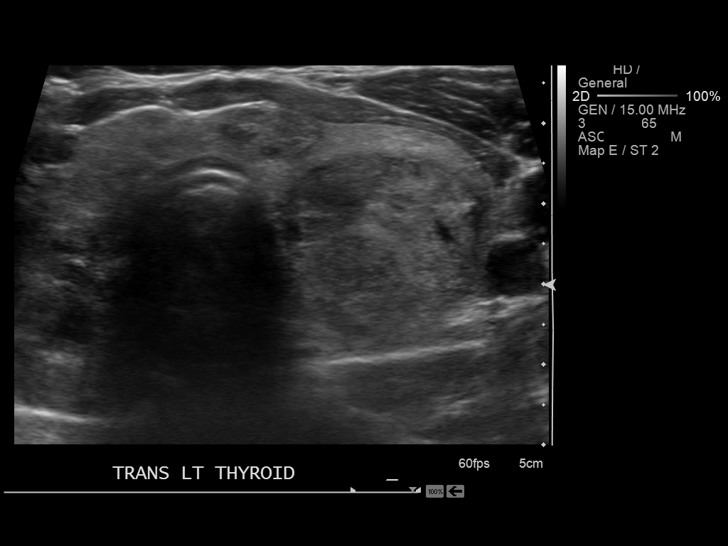
[im 57/76]
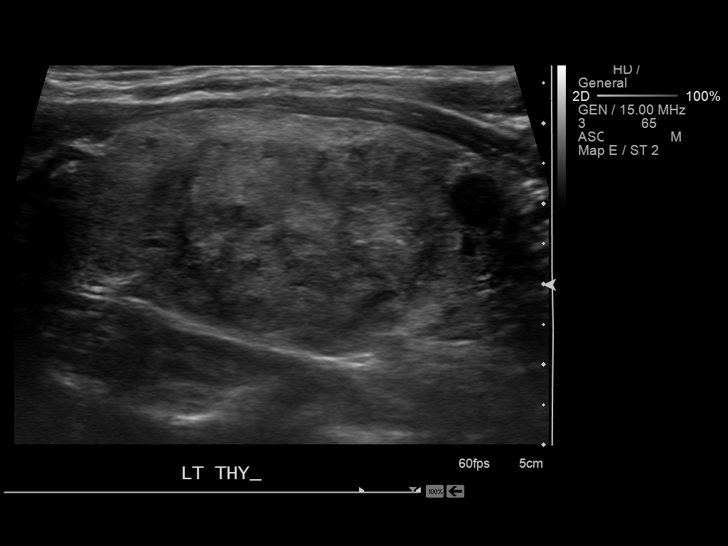
[im 63/76]
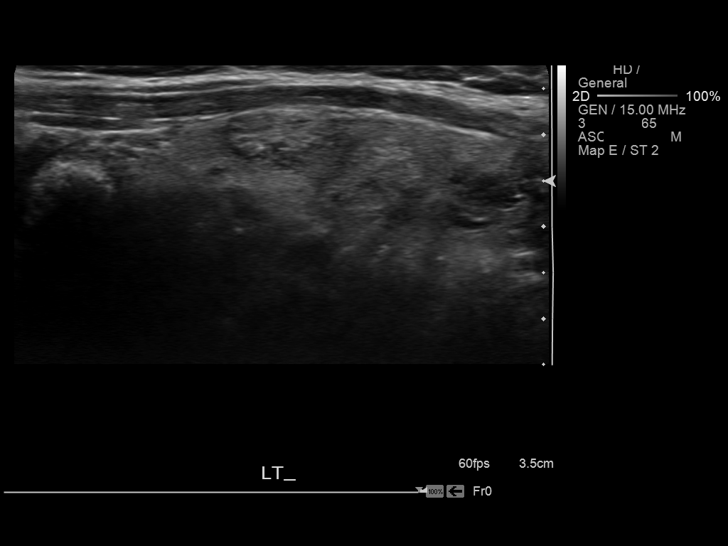
[im 69/76]
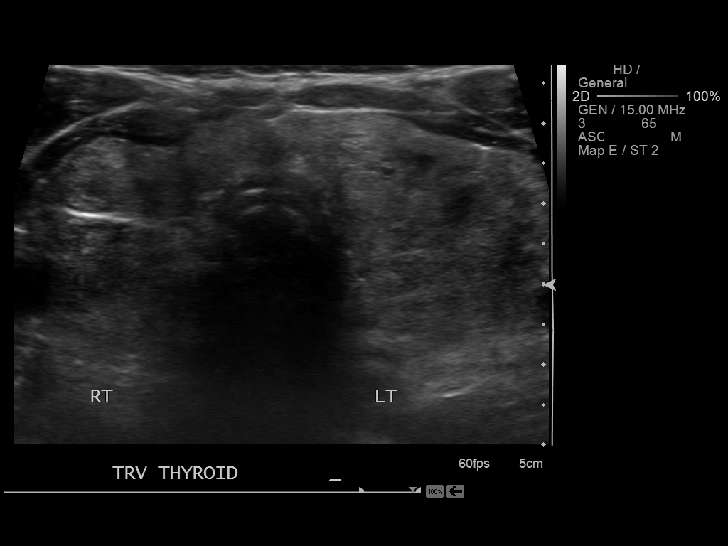
[im 76/76]
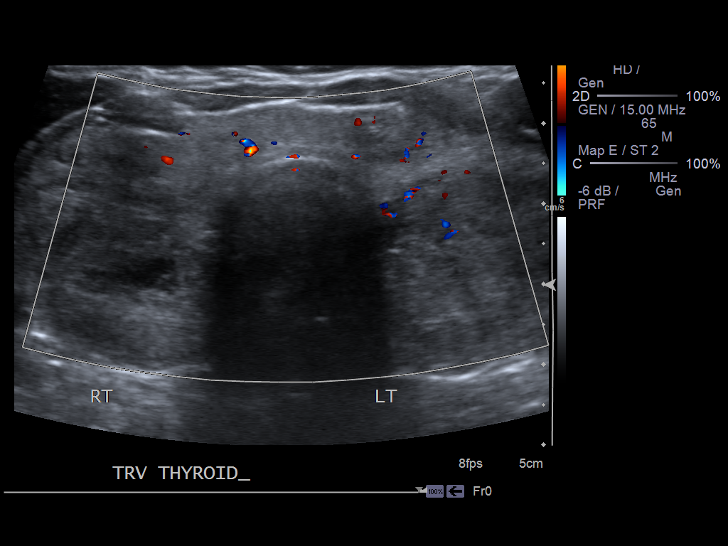

[13 of 25 positions shown; findings below may reference images not displayed]

FINDINGS: There is diffuse heterogeneity of the thyroid parenchymal
echotexture

Right thyroid lobe

Measurements: Normal in size measuring 4.6 x 2.6 x 3.4 cm.

Right, superior, lateral (labeled #1) - 1.9 x 1.2 x 1.8 cm - mixed
echogenic, solid, ill-defined.

Right, mid/inferior, posterior (labeled #2) - 3.4 x 2.4 x 2.5 cm -
mixed echogenic, partially cystic, predominantly solid, ill-defined

Left thyroid lobe

Measurements: Borderline enlarged measuring 5.6 x 3.0 x 2.6 cm.

Left, mid, inferior - 3.6 x 2.5 x 2.7 cm - mixed echogenic, solid

Isthmus

Thickness: Borderline enlarged measuring 0.7 cm in diameter.

Left, lateral - 1.4 x 0.6 x 1.2 cm - mixed echogenic, solid

Lymphadenopathy

None visualized.
IMPRESSION: Findings most suggestive of multi nodular goiter. The dominant
approximately 1.9 cm nodule within the superior aspect of the right
lobe of the thyroid, the dominant approximately 3.4 cm nodule/mass
within the mid / inferior aspect of the right lobe of the thyroid as
well as the dominant approximately 3.6 cm nodule/mass within the
inferior aspect of the left lobe of the thyroid all meet imaging
criteria to recommend percutaneous sampling as clinically indicated.

This recommendation follows the consensus statement: Management of
Thyroid Nodules Detected at [place]: Society of Radiologists in
Ultrasound Consensus Conference Statement. Radiology 8669;

## 2016-02-08 ENCOUNTER — Other Ambulatory Visit (INDEPENDENT_AMBULATORY_CARE_PROVIDER_SITE_OTHER): Payer: BC Managed Care – PPO

## 2016-02-08 DIAGNOSIS — E7800 Pure hypercholesterolemia, unspecified: Secondary | ICD-10-CM | POA: Diagnosis not present

## 2016-02-08 LAB — HEPATIC FUNCTION PANEL
ALT: 26 U/L (ref 0–53)
AST: 17 U/L (ref 0–37)
Albumin: 3.7 g/dL (ref 3.5–5.2)
Alkaline Phosphatase: 81 U/L (ref 39–117)
BILIRUBIN DIRECT: 0.1 mg/dL (ref 0.0–0.3)
BILIRUBIN TOTAL: 0.5 mg/dL (ref 0.2–1.2)
TOTAL PROTEIN: 6.4 g/dL (ref 6.0–8.3)

## 2016-02-09 ENCOUNTER — Other Ambulatory Visit: Payer: Self-pay | Admitting: Internal Medicine

## 2016-02-09 DIAGNOSIS — R739 Hyperglycemia, unspecified: Secondary | ICD-10-CM

## 2016-02-09 DIAGNOSIS — Z125 Encounter for screening for malignant neoplasm of prostate: Secondary | ICD-10-CM

## 2016-02-09 DIAGNOSIS — M6740 Ganglion, unspecified site: Secondary | ICD-10-CM

## 2016-02-09 DIAGNOSIS — I10 Essential (primary) hypertension: Secondary | ICD-10-CM

## 2016-02-09 DIAGNOSIS — E7800 Pure hypercholesterolemia, unspecified: Secondary | ICD-10-CM

## 2016-02-09 NOTE — Progress Notes (Signed)
Orders placed for f/u labs.  

## 2016-02-15 ENCOUNTER — Encounter: Payer: BC Managed Care – PPO | Admitting: Internal Medicine

## 2016-04-26 ENCOUNTER — Encounter: Payer: Self-pay | Admitting: Internal Medicine

## 2016-04-26 ENCOUNTER — Other Ambulatory Visit (INDEPENDENT_AMBULATORY_CARE_PROVIDER_SITE_OTHER): Payer: BC Managed Care – PPO

## 2016-04-26 DIAGNOSIS — R739 Hyperglycemia, unspecified: Secondary | ICD-10-CM | POA: Diagnosis not present

## 2016-04-26 DIAGNOSIS — I10 Essential (primary) hypertension: Secondary | ICD-10-CM

## 2016-04-26 DIAGNOSIS — E7800 Pure hypercholesterolemia, unspecified: Secondary | ICD-10-CM

## 2016-04-26 DIAGNOSIS — Z125 Encounter for screening for malignant neoplasm of prostate: Secondary | ICD-10-CM | POA: Diagnosis not present

## 2016-04-26 LAB — BASIC METABOLIC PANEL
BUN: 11 mg/dL (ref 6–23)
CALCIUM: 10 mg/dL (ref 8.4–10.5)
CO2: 32 meq/L (ref 19–32)
CREATININE: 1.12 mg/dL (ref 0.40–1.50)
Chloride: 107 mEq/L (ref 96–112)
GFR: 73.21 mL/min (ref >60.00)
Glucose, Bld: 96 mg/dL (ref 70–99)
Potassium: 4.4 mEq/L (ref 3.5–5.1)
Sodium: 143 mEq/L (ref 135–145)

## 2016-04-26 LAB — CBC WITH DIFFERENTIAL/PLATELET
BASOS ABS: 0 10*3/uL (ref 0.0–0.1)
Basophils Relative: 0.7 % (ref 0.0–3.0)
EOS ABS: 0.3 10*3/uL (ref 0.0–0.7)
Eosinophils Relative: 5 % (ref 0.0–5.0)
HEMATOCRIT: 45.9 % (ref 39.0–52.0)
Hemoglobin: 15.8 g/dL (ref 13.0–17.0)
LYMPHS PCT: 28.8 % (ref 12.0–46.0)
Lymphs Abs: 1.8 10*3/uL (ref 0.7–4.0)
MCHC: 34.5 g/dL (ref 30.0–36.0)
MCV: 85.8 fl (ref 78.0–100.0)
MONO ABS: 0.5 10*3/uL (ref 0.1–1.0)
Monocytes Relative: 7.5 % (ref 3.0–12.0)
NEUTROS ABS: 3.6 10*3/uL (ref 1.4–7.7)
Neutrophils Relative %: 58 % (ref 43.0–77.0)
PLATELETS: 251 10*3/uL (ref 150.0–400.0)
RBC: 5.35 Mil/uL (ref 4.22–5.81)
RDW: 13.8 % (ref 11.5–15.5)
WBC: 6.2 10*3/uL (ref 4.0–10.5)

## 2016-04-26 LAB — LIPID PANEL
CHOL/HDL RATIO: 4
Cholesterol: 145 mg/dL (ref 0–200)
HDL: 40.8 mg/dL (ref >39.00)
LDL CALC: 84 mg/dL (ref 0–99)
NonHDL: 104.23
TRIGLYCERIDES: 99 mg/dL (ref 0.0–149.0)
VLDL: 19.8 mg/dL (ref 0.0–40.0)

## 2016-04-26 LAB — HEPATIC FUNCTION PANEL
ALT: 36 U/L (ref 0–53)
AST: 19 U/L (ref 0–37)
Albumin: 4 g/dL (ref 3.5–5.2)
Alkaline Phosphatase: 79 U/L (ref 39–117)
BILIRUBIN DIRECT: 0.1 mg/dL (ref 0.0–0.3)
BILIRUBIN TOTAL: 0.6 mg/dL (ref 0.2–1.2)
Total Protein: 6.6 g/dL (ref 6.0–8.3)

## 2016-04-26 LAB — PSA: PSA: 0.45 ng/mL (ref 0.10–4.00)

## 2016-04-26 LAB — HEMOGLOBIN A1C: HEMOGLOBIN A1C: 5.6 % (ref 4.6–6.5)

## 2016-05-10 ENCOUNTER — Other Ambulatory Visit: Payer: BC Managed Care – PPO

## 2016-05-17 ENCOUNTER — Encounter: Payer: BC Managed Care – PPO | Admitting: Internal Medicine

## 2016-06-04 ENCOUNTER — Other Ambulatory Visit: Payer: Self-pay | Admitting: Internal Medicine

## 2016-12-03 ENCOUNTER — Other Ambulatory Visit: Payer: Self-pay | Admitting: Internal Medicine

## 2016-12-03 DIAGNOSIS — E042 Nontoxic multinodular goiter: Secondary | ICD-10-CM

## 2016-12-05 ENCOUNTER — Encounter: Payer: Self-pay | Admitting: Internal Medicine

## 2016-12-05 ENCOUNTER — Ambulatory Visit (INDEPENDENT_AMBULATORY_CARE_PROVIDER_SITE_OTHER): Payer: BC Managed Care – PPO | Admitting: Internal Medicine

## 2016-12-05 VITALS — BP 118/64 | HR 61 | Temp 98.3°F | Resp 12 | Ht 67.72 in | Wt 250.4 lb

## 2016-12-05 DIAGNOSIS — Z125 Encounter for screening for malignant neoplasm of prostate: Secondary | ICD-10-CM | POA: Diagnosis not present

## 2016-12-05 DIAGNOSIS — Z6838 Body mass index (BMI) 38.0-38.9, adult: Secondary | ICD-10-CM

## 2016-12-05 DIAGNOSIS — I10 Essential (primary) hypertension: Secondary | ICD-10-CM

## 2016-12-05 DIAGNOSIS — E7800 Pure hypercholesterolemia, unspecified: Secondary | ICD-10-CM

## 2016-12-05 DIAGNOSIS — Z0000 Encounter for general adult medical examination without abnormal findings: Secondary | ICD-10-CM | POA: Diagnosis not present

## 2016-12-05 DIAGNOSIS — R739 Hyperglycemia, unspecified: Secondary | ICD-10-CM | POA: Diagnosis not present

## 2016-12-05 DIAGNOSIS — E041 Nontoxic single thyroid nodule: Secondary | ICD-10-CM

## 2016-12-05 LAB — BASIC METABOLIC PANEL
BUN: 13 mg/dL (ref 6–23)
CALCIUM: 10.4 mg/dL (ref 8.4–10.5)
CHLORIDE: 104 meq/L (ref 96–112)
CO2: 33 meq/L — AB (ref 19–32)
Creatinine, Ser: 1.2 mg/dL (ref 0.40–1.50)
GFR: 67.44 mL/min (ref >60.00)
GLUCOSE: 91 mg/dL (ref 70–99)
POTASSIUM: 4.3 meq/L (ref 3.5–5.1)
SODIUM: 139 meq/L (ref 135–145)

## 2016-12-05 LAB — TSH: TSH: 1.09 u[IU]/mL (ref 0.35–4.50)

## 2016-12-05 LAB — LIPID PANEL
CHOL/HDL RATIO: 3
CHOLESTEROL: 138 mg/dL (ref 0–200)
HDL: 42.1 mg/dL (ref >39.00)
LDL Cholesterol: 77 mg/dL (ref 0–99)
NonHDL: 95.6
TRIGLYCERIDES: 95 mg/dL (ref 0.0–149.0)
VLDL: 19 mg/dL (ref 0.0–40.0)

## 2016-12-05 LAB — PSA: PSA: 0.35 ng/mL (ref 0.10–4.00)

## 2016-12-05 LAB — HEMOGLOBIN A1C: Hgb A1c MFr Bld: 5.6 % (ref 4.6–6.5)

## 2016-12-05 LAB — HEPATIC FUNCTION PANEL
ALT: 30 U/L (ref 0–53)
AST: 18 U/L (ref 0–37)
Albumin: 4.1 g/dL (ref 3.5–5.2)
Alkaline Phosphatase: 81 U/L (ref 39–117)
BILIRUBIN DIRECT: 0.1 mg/dL (ref 0.0–0.3)
TOTAL PROTEIN: 6.9 g/dL (ref 6.0–8.3)
Total Bilirubin: 0.7 mg/dL (ref 0.2–1.2)

## 2016-12-05 MED ORDER — ROSUVASTATIN CALCIUM 20 MG PO TABS: 20.0000 mg | ORAL_TABLET | Freq: Every day | ORAL | 1 refills | Status: DC

## 2016-12-05 MED ORDER — LISINOPRIL 20 MG PO TABS: ORAL_TABLET | ORAL | 1 refills | Status: DC

## 2016-12-05 NOTE — Assessment & Plan Note (Signed)
On crestor.  Low cholesterol diet and exercise.  Follow lipid panel and liver function tests.   

## 2016-12-05 NOTE — Progress Notes (Signed)
Pre-visit discussion using our clinic review tool. No additional management support is needed unless otherwise documented below in the visit note.  

## 2016-12-05 NOTE — Assessment & Plan Note (Signed)
Physical today 12/05/16.  Check psa today.  Colonoscopy 03/30/15 - diverticulosis.

## 2016-12-05 NOTE — Assessment & Plan Note (Signed)
Discussed the importance of diet and exercise.  Follow.

## 2016-12-05 NOTE — Assessment & Plan Note (Signed)
Blood pressure under good control.  Continue same medication regimen.  Follow pressures.  Follow metabolic panel.   

## 2016-12-05 NOTE — Assessment & Plan Note (Signed)
Saw Dr Eddie Dibbles.  Has f/u ultrasound and appt next week.

## 2016-12-05 NOTE — Progress Notes (Signed)
Patient ID: Samuel Last., male   DOB: [birth year], 51 y.o.   MRN: [id number]   Subjective:    Patient ID: Samuel Last., male    DOB: 1965/09/26, 51 y.o.   MRN: [id number]  HPI  Patient here for a scheduled follow up.  States he is doing well.  Tries to stay active.  No chest pain.  No sob.  On acid reflux.  No abdominal pain.  Bowels moving.  Blood pressure is doing well.  Has f/u scheduled for thyroid ultrasound and f/u with Dr Eddie Dibbles next week.  Some increased stress with his parents health issues, but he is handling things well.  Discussed diet and exercise.  Has gained some weight.     Past Medical History:  Diagnosis Date  . History of chicken pox   . Hypercholesterolemia   . Hypertension    Past Surgical History:  Procedure Laterality Date  . TUMOR REMOVAL  7/08 & 5/14   left shoulder, lipoma   Family History  Problem Relation Age of Onset  . Hyperlipidemia Mother   . Hypertension Mother   . Hyperlipidemia Father   . Kidney cancer Father   . Hyperlipidemia Paternal Aunt   . Colon cancer Paternal Uncle   . Prostate cancer Paternal Uncle   . Hyperlipidemia Paternal Uncle   . Hyperlipidemia Paternal Grandmother   . Hyperlipidemia Paternal Grandfather    Social History   Social History  . Marital status: Married    Spouse name: N/A  . Number of children: N/A  . Years of education: N/A   Social History Main Topics  . Smoking status: Never Smoker  . Smokeless tobacco: Never Used  . Alcohol use 0.0 oz/week     Comment: rarley  . Drug use: No  . Sexual activity: Not Asked   Other Topics Concern  . None   Social History Narrative  . None    Outpatient Encounter Prescriptions as of 12/05/2016  Medication Sig  . aspirin 81 MG tablet Take 81 mg by mouth daily.  Marland Kitchen lisinopril (PRINIVIL,ZESTRIL) 20 MG tablet Take 1 tablet (20 mg total) by mouth daily.  . rosuvastatin (CRESTOR) 20 MG tablet Take 1 tablet (20 mg total) by mouth daily.  .  [DISCONTINUED] lisinopril (PRINIVIL,ZESTRIL) 20 MG tablet Take 1 tablet (20 mg total) by mouth daily.  . [DISCONTINUED] rosuvastatin (CRESTOR) 20 MG tablet Take 1 tablet (20 mg total) by mouth daily.  . [DISCONTINUED] polyethylene glycol powder (GLYCOLAX/MIRALAX) powder Take 255 g by mouth once.   No facility-administered encounter medications on file as of 12/05/2016.     Review of Systems  Constitutional: Negative for activity change and unexpected weight change.  HENT: Negative for congestion and sinus pressure.   Eyes: Negative for pain and visual disturbance.  Respiratory: Negative for cough, chest tightness and shortness of breath.   Cardiovascular: Negative for chest pain, palpitations and leg swelling.  Gastrointestinal: Negative for abdominal pain, diarrhea, nausea and vomiting.  Genitourinary: Negative for difficulty urinating and dysuria.  Musculoskeletal: Negative for back pain and joint swelling.  Skin: Negative for color change and rash.  Neurological: Negative for dizziness, light-headedness and headaches.  Hematological: Negative for adenopathy. Does not bruise/bleed easily.  Psychiatric/Behavioral: Negative for agitation and dysphoric mood.       Objective:    Physical Exam  Constitutional: He is oriented to person, place, and time. He appears well-developed and well-nourished. No distress.  HENT:  Head: Normocephalic and atraumatic.  Nose: Nose  normal.  Mouth/Throat: Oropharynx is clear and moist. No oropharyngeal exudate.  Eyes: Conjunctivae are normal. Right eye exhibits no discharge. Left eye exhibits no discharge.  Neck: Neck supple. No thyromegaly present.  Cardiovascular: Normal rate and regular rhythm.   Pulmonary/Chest: Breath sounds normal. No respiratory distress. He has no wheezes.  Abdominal: Soft. Bowel sounds are normal. There is no tenderness.  Genitourinary:  Genitourinary Comments: Rectal:  Heme negative.    Musculoskeletal: He exhibits no edema  or tenderness.  Lymphadenopathy:    He has no cervical adenopathy.  Neurological: He is alert and oriented to person, place, and time.  Skin: Skin is warm and dry. No rash noted. No erythema.  Psychiatric: He has a normal mood and affect. His behavior is normal.    BP 118/64 (BP Location: Left Arm, Patient Position: Sitting, Cuff Size: Normal)   Pulse 61   Temp 98.3 F (36.8 C) (Oral)   Resp 12   Ht 5' 7.72" (1.72 m)   Wt 250 lb 6.4 oz (113.6 kg)   SpO2 94%   BMI 38.39 kg/m  Wt Readings from Last 3 Encounters:  12/05/16 250 lb 6.4 oz (113.6 kg)  11/08/15 241 lb 2 oz (109.4 kg)  05/09/15 235 lb 12 oz (106.9 kg)     Lab Results  Component Value Date   WBC 6.2 04/26/2016   HGB 15.8 04/26/2016   HCT 45.9 04/26/2016   PLT 251.0 04/26/2016   GLUCOSE 96 04/26/2016   CHOL 145 04/26/2016   TRIG 99.0 04/26/2016   HDL 40.80 04/26/2016   LDLDIRECT 166.3 05/08/2013   LDLCALC 84 04/26/2016   ALT 36 04/26/2016   AST 19 04/26/2016   NA 143 04/26/2016   K 4.4 04/26/2016   CL 107 04/26/2016   CREATININE 1.12 04/26/2016   BUN 11 04/26/2016   CO2 32 04/26/2016   TSH 0.80 04/01/2015   PSA 0.45 04/26/2016   HGBA1C 5.6 04/26/2016    US Soft Tissue Head/neck  Result Date: 12/07/2015 CLINICAL DATA:  Followup goiter EXAM: THYROID ULTRASOUND TECHNIQUE: Ultrasound examination of the thyroid gland and adjacent soft tissues was performed. COMPARISON:  12/13/2014 FINDINGS: Right thyroid lobe Measurements: 6.0 x 3.2 x 3.4 cm. Dominant heterogeneous lower pole nodule measures 3.2 x 3.1 x 2.7 cm and previously measured up to 3.3 cm. Upper pole predominately solid nodule measures 1.5 x 0.9 x 1.3 cm and previously measured up to 1.7 cm. Medial heterogeneous nodule versus pseudo nodule measures 1.7 x 0.6 x 1.5 cm. It was not measured previously. Left thyroid lobe Measurements: 5.9 x 3.1 x 3.3 cm. Dominant heterogeneous lower pole nodule measures 3.8 x 2.9 x 3.0 cm. Previously, it measured 3.8 x 2.4 x  3.9 cm. Isthmus Thickness: 0.8 cm. Small nodule measures 1.0 x 0.7 x 0.9 cm and previously measured up to 1.3 cm. Lymphadenopathy None visualized. IMPRESSION: New right mid lobe nodule versus pseudo nodule measures 1.7 cm. Other nodules are either smaller or not significantly changed. Findings meet consensus criteria for biopsy. Ultrasound-guided fine needle aspiration should be considered, as per the consensus statement: Management of Thyroid Nodules Detected at [place]: Society of Radiologists in [place]. Radiology 2005; [id number]. Electronically Signed   By: Marybelle Killings M.D.   On: 12/07/2015 12:13       Assessment & Plan:   Problem List Items Addressed This Visit    BMI 38.0-38.9,adult    Discussed the importance of diet and exercise.  Follow.  Essential hypertension, benign    Blood pressure under good control.  Continue same medication regimen.  Follow pressures.  Follow metabolic panel.        Relevant Medications   lisinopril (PRINIVIL,ZESTRIL) 20 MG tablet   rosuvastatin (CRESTOR) 20 MG tablet   Other Relevant Orders   Basic metabolic panel   Health care maintenance    Physical today 12/05/16.  Check psa today.  Colonoscopy 03/30/15 - diverticulosis.        Hypercholesterolemia    On crestor.  Low cholesterol diet and exercise.  Follow lipid panel and liver function tests.        Relevant Medications   lisinopril (PRINIVIL,ZESTRIL) 20 MG tablet   rosuvastatin (CRESTOR) 20 MG tablet   Other Relevant Orders   Hepatic function panel   Lipid panel   Thyroid nodule    Saw Dr Eddie Dibbles.  Has f/u ultrasound and appt next week.        Relevant Orders   TSH    Other Visit Diagnoses    Prostate cancer screening    -  Primary   Relevant Orders   PSA   Hyperglycemia       Relevant Orders   Hemoglobin A1c       Einar Pheasant, MD

## 2016-12-07 NOTE — Telephone Encounter (Signed)
Hard copy mailed  

## 2016-12-10 ENCOUNTER — Ambulatory Visit: Payer: BC Managed Care – PPO

## 2016-12-18 ENCOUNTER — Ambulatory Visit
Admission: RE | Admit: 2016-12-18 | Discharge: 2016-12-18 | Disposition: A | Discharge status: Home / Self Care | Payer: BC Managed Care – PPO | Source: Ambulatory Visit | Attending: Internal Medicine | Admitting: Internal Medicine

## 2016-12-18 DIAGNOSIS — E042 Nontoxic multinodular goiter: Secondary | ICD-10-CM | POA: Insufficient documentation

## 2017-06-20 ENCOUNTER — Ambulatory Visit: Payer: BC Managed Care – PPO | Admitting: Internal Medicine

## 2017-06-20 ENCOUNTER — Encounter: Payer: Self-pay | Admitting: Internal Medicine

## 2017-06-20 VITALS — BP 134/84 | HR 78 | Temp 98.3°F | Resp 18 | Wt 256.4 lb

## 2017-06-20 DIAGNOSIS — Z6839 Body mass index (BMI) 39.0-39.9, adult: Secondary | ICD-10-CM

## 2017-06-20 DIAGNOSIS — E7800 Pure hypercholesterolemia, unspecified: Secondary | ICD-10-CM | POA: Diagnosis not present

## 2017-06-20 DIAGNOSIS — E041 Nontoxic single thyroid nodule: Secondary | ICD-10-CM

## 2017-06-20 DIAGNOSIS — I10 Essential (primary) hypertension: Secondary | ICD-10-CM | POA: Diagnosis not present

## 2017-06-20 LAB — CBC WITH DIFFERENTIAL/PLATELET
BASOS ABS: 0.1 10*3/uL (ref 0.0–0.1)
BASOS PCT: 1 % (ref 0.0–3.0)
EOS ABS: 0.1 10*3/uL (ref 0.0–0.7)
Eosinophils Relative: 2.6 % (ref 0.0–5.0)
HEMATOCRIT: 47.5 % (ref 39.0–52.0)
HEMOGLOBIN: 16.2 g/dL (ref 13.0–17.0)
LYMPHS PCT: 30.9 % (ref 12.0–46.0)
Lymphs Abs: 1.8 10*3/uL (ref 0.7–4.0)
MCHC: 34.1 g/dL (ref 30.0–36.0)
MCV: 85.6 fl (ref 78.0–100.0)
MONOS PCT: 8.4 % (ref 3.0–12.0)
Monocytes Absolute: 0.5 10*3/uL (ref 0.1–1.0)
Neutro Abs: 3.3 10*3/uL (ref 1.4–7.7)
Neutrophils Relative %: 57.1 % (ref 43.0–77.0)
PLATELETS: 272 10*3/uL (ref 150.0–400.0)
RBC: 5.54 Mil/uL (ref 4.22–5.81)
RDW: 13.1 % (ref 11.5–15.5)
WBC: 5.7 10*3/uL (ref 4.0–10.5)

## 2017-06-20 LAB — LIPID PANEL
Cholesterol: 141 mg/dL (ref 0–200)
HDL: 43.4 mg/dL (ref >39.00)
LDL Cholesterol: 77 mg/dL (ref 0–99)
NonHDL: 97.97
Total CHOL/HDL Ratio: 3
Triglycerides: 103 mg/dL (ref 0.0–149.0)
VLDL: 20.6 mg/dL (ref 0.0–40.0)

## 2017-06-20 LAB — BASIC METABOLIC PANEL
BUN: 11 mg/dL (ref 6–23)
CHLORIDE: 104 meq/L (ref 96–112)
CO2: 32 meq/L (ref 19–32)
Calcium: 10.2 mg/dL (ref 8.4–10.5)
Creatinine, Ser: 1.1 mg/dL (ref 0.40–1.50)
GFR: 74.41 mL/min (ref >60.00)
Glucose, Bld: 97 mg/dL (ref 70–99)
POTASSIUM: 4.6 meq/L (ref 3.5–5.1)
Sodium: 138 mEq/L (ref 135–145)

## 2017-06-20 LAB — HEPATIC FUNCTION PANEL
ALBUMIN: 3.9 g/dL (ref 3.5–5.2)
ALK PHOS: 78 U/L (ref 39–117)
ALT: 35 U/L (ref 0–53)
AST: 21 U/L (ref 0–37)
BILIRUBIN DIRECT: 0.1 mg/dL (ref 0.0–0.3)
TOTAL PROTEIN: 7.1 g/dL (ref 6.0–8.3)
Total Bilirubin: 0.6 mg/dL (ref 0.2–1.2)

## 2017-06-20 NOTE — Progress Notes (Signed)
Patient ID: Samuel Last., male   DOB: 1965/11/23, 52 y.o.   MRN: [id number]   Subjective:    Patient ID: Samuel Last., male    DOB: [birth year], 52 y.o.   MRN: [id number]  HPI  Patient here for a scheduled follow up.  States he is doing well.  Has gained some weight.  We discussed diet and exercise.  States he is trying to watch what he eats.  No chest pain. No sob. No acid reflux.  No abdominal pain.  Bowels moving.  Saw endocrinology 12/27/16.  Felt stable.  Recommended f/u ultrasound in two years.     Past Medical History:  Diagnosis Date  . History of chicken pox   . Hypercholesterolemia   . Hypertension    Past Surgical History:  Procedure Laterality Date  . TUMOR REMOVAL  7/08 & 5/14   left shoulder, lipoma   Family History  Problem Relation Age of Onset  . Hyperlipidemia Mother   . Hypertension Mother   . Hyperlipidemia Father   . Kidney cancer Father   . Hyperlipidemia Paternal Aunt   . Colon cancer Paternal Uncle   . Prostate cancer Paternal Uncle   . Hyperlipidemia Paternal Uncle   . Hyperlipidemia Paternal Grandmother   . Hyperlipidemia Paternal Grandfather    Social History   Socioeconomic History  . Marital status: Married    Spouse name: None  . Number of children: None  . Years of education: None  . Highest education level: None  Social Needs  . Financial resource strain: None  . Food insecurity - worry: None  . Food insecurity - inability: None  . Transportation needs - medical: None  . Transportation needs - non-medical: None  Occupational History  . None  Tobacco Use  . Smoking status: Never Smoker  . Smokeless tobacco: Never Used  Substance and Sexual Activity  . Alcohol use: Yes    Alcohol/week: 0.0 oz    Comment: rarley  . Drug use: No  . Sexual activity: None  Other Topics Concern  . None  Social History Narrative  . None    Outpatient Encounter Medications as of 06/20/2017  Medication Sig  . aspirin 81 MG  tablet Take 81 mg by mouth daily.  Marland Kitchen lisinopril (PRINIVIL,ZESTRIL) 20 MG tablet Take 1 tablet (20 mg total) by mouth daily.  . rosuvastatin (CRESTOR) 20 MG tablet Take 1 tablet (20 mg total) by mouth daily.   No facility-administered encounter medications on file as of 06/20/2017.     Review of Systems  Constitutional: Negative for appetite change.       Has gained some weight.   HENT: Negative for congestion and sinus pressure.   Respiratory: Negative for cough, chest tightness and shortness of breath.   Cardiovascular: Negative for chest pain, palpitations and leg swelling.  Gastrointestinal: Negative for abdominal pain, diarrhea, nausea and vomiting.  Genitourinary: Negative for difficulty urinating and dysuria.  Musculoskeletal: Negative for joint swelling and myalgias.  Skin: Negative for color change and rash.  Neurological: Negative for dizziness, light-headedness and headaches.  Psychiatric/Behavioral: Negative for agitation and dysphoric mood.       Objective:    Physical Exam  Constitutional: He appears well-developed and well-nourished. No distress.  HENT:  Nose: Nose normal.  Mouth/Throat: Oropharynx is clear and moist.  Neck: Neck supple.  Cardiovascular: Normal rate and regular rhythm.  Pulmonary/Chest: Effort normal and breath sounds normal. No respiratory distress.  Abdominal: Soft. Bowel sounds are normal.  There is no tenderness.  Musculoskeletal: He exhibits no edema or tenderness.  Lymphadenopathy:    He has no cervical adenopathy.  Skin: No rash noted. No erythema.  Psychiatric: He has a normal mood and affect. His behavior is normal.    BP 134/84   Pulse 78   Temp 98.3 F (36.8 C) (Oral)   Resp 18   Wt 256 lb 6.4 oz (116.3 kg)   SpO2 95%   BMI 39.31 kg/m  Wt Readings from Last 3 Encounters:  06/20/17 256 lb 6.4 oz (116.3 kg)  12/05/16 250 lb 6.4 oz (113.6 kg)  11/08/15 241 lb 2 oz (109.4 kg)     Lab Results  Component Value Date   WBC 5.7  06/20/2017   HGB 16.2 06/20/2017   HCT 47.5 06/20/2017   PLT 272.0 06/20/2017   GLUCOSE 97 06/20/2017   CHOL 141 06/20/2017   TRIG 103.0 06/20/2017   HDL 43.40 06/20/2017   LDLDIRECT 166.3 05/08/2013   LDLCALC 77 06/20/2017   ALT 35 06/20/2017   AST 21 06/20/2017   NA 138 06/20/2017   K 4.6 06/20/2017   CL 104 06/20/2017   CREATININE 1.10 06/20/2017   BUN 11 06/20/2017   CO2 32 06/20/2017   TSH 1.09 12/05/2016   PSA 0.35 12/05/2016   HGBA1C 5.6 12/05/2016    US Thyroid  Result Date: 12/18/2016 CLINICAL DATA:  Nontoxic multinodular goiter EXAM: THYROID ULTRASOUND TECHNIQUE: Ultrasound examination of the thyroid gland and adjacent soft tissues was performed. COMPARISON:  12/07/2015 and previous back to 04/27/2014 FINDINGS: Parenchymal Echotexture: Moderately heterogenous Isthmus: 0.9 cm thickness, previously 0.8 Right lobe: 5.8 x 3.4 x 2.9 cm, previously 6 x 3.2 x 3.4 Left lobe: 5 x 3.2 x 3.2 cm, previously 5.9 x 3.1 x 3.3 _________________________________________________________ Estimated total number of nodules >/= 1 cm: 5 Number of spongiform nodules >/=  2 cm not described below (TR1): 0 Number of mixed cystic and solid nodules >/= 1.5 cm not described below ([place]): 0 _________________________________________________________ Nodule # 1: Prior biopsy: No Location: Right; Superior Maximum size: 1.7 cm; Other 2 dimensions: 1.5 x 1.1 cm, previously, 1.5 x 0.9 x 1.3 cm Composition: mixed cystic and solid (1) Echogenicity: hypoechoic (2) Shape: not taller-than-wide (0) Margins: ill-defined (0) Echogenic foci: none (0) ACR TI-RADS total points: 3. ACR TI-RADS risk category:  TR3 (3 points). Significant change in size (>/= 20% in two dimensions and minimal increase of 2 mm): No Change in features: No Change in ACR TI-RADS risk category: No ACR TI-RADS recommendations: *Given size (>/= 1.5 - 2.4 cm) and appearance, a follow-up ultrasound in 1 year should be considered based on TI-RADS criteria.  _________________________________________________________ Nodule # 2: Prior biopsy: No Location: Right; Inferior Maximum size: 3.4 cm; Other 2 dimensions: 2.9 x 2.5 cm, previously, 3.2 x 3.1 x 2.7 cm Composition: solid/almost completely solid (2) Echogenicity: isoechoic (1) Shape: taller-than-wide (3) Margins: ill-defined (0) Echogenic foci: none (0) ACR TI-RADS total points: 6. ACR TI-RADS risk category:  TR4 (4-6 points). Significant change in size (>/= 20% in two dimensions and minimal increase of 2 mm): No Change in features: No Change in ACR TI-RADS risk category: No ACR TI-RADS recommendations: **Given size (>/= 1.5 cm) and appearance, fine needle aspiration of this moderately suspicious nodule should be considered based on TI-RADS criteria. _________________________________________________________ Nodule # 3: Prior biopsy: No Location: Right; Mid Maximum size: 1.6 cm; Other 2 dimensions: 0.6 x 1.5 cm, previously, 1.7 x 0.6 x 1.5 cm Composition: solid/almost completely  solid (2) Echogenicity: isoechoic (1) Shape: not taller-than-wide (0) Margins: ill-defined (0) Echogenic foci: none (0) ACR TI-RADS total points: 3. ACR TI-RADS risk category:  TR3 (3 points). Significant change in size (>/= 20% in two dimensions and minimal increase of 2 mm): No Change in features: No Change in ACR TI-RADS risk category: No ACR TI-RADS recommendations: *Given size (>/= 1.5 - 2.4 cm) and appearance, a follow-up ultrasound in 1 year should be considered based on TI-RADS criteria. _________________________________________________________ Nodule # 4: Prior biopsy: No Location: Left; Inferior Maximum size: 3.9 cm; Other 2 dimensions: 2.9 x 3.4 cm, previously, 3.8 x 2.9 x 3 cm Composition: solid/almost completely solid (2) Echogenicity: hypoechoic (2) Shape: taller-than-wide (3) Margins: ill-defined (0) Echogenic foci: none (0) ACR TI-RADS total points: 7. ACR TI-RADS risk category:  TR5 (>/= 7 points). Significant change in size  (>/= 20% in two dimensions and minimal increase of 2 mm): No Change in features: No Change in ACR TI-RADS risk category: No ACR TI-RADS recommendations: **Given size (>/= 1.0 cm) and appearance, fine needle aspiration of this highly suspicious nodule should be considered based on TI-RADS criteria. _________________________________________________________ Nodule # 5: Prior biopsy: No Location: Isthmus; left of midline Maximum size: 1.2 cm; Other 2 dimensions: 0.6 x 1 cm, previously, 1 x 0.9 x 0.7 cm Composition: mixed cystic and solid (1) Echogenicity: hypoechoic (2) Shape: not taller-than-wide (0) Margins: ill-defined (0) Echogenic foci: none (0) ACR TI-RADS total points: 3. ACR TI-RADS risk category:  TR3 (3 points). Significant change in size (>/= 20% in two dimensions and minimal increase of 2 mm): No Change in features: No Change in ACR TI-RADS risk category: No ACR TI-RADS recommendations: Given size (<1.4 cm) and appearance, this nodule does NOT meet TI-RADS criteria for biopsy or dedicated follow-up. _________________________________________________________ IMPRESSION: 1. Stable thyromegaly with multiple nodules. 2. Recommend FNA biopsy of highly suspicious 3.9 cm inferior left and moderately suspicious 3.4 cm inferior right nodules. 3. Recommend 1-2 year follow-up surveillance ultrasound of additional lesions as above. The above is in keeping with the ACR TI-RADS recommendations - J Am Coll Radiol 2017;14:587-595. Electronically Signed   By: Lucrezia Europe M.D.   On: 12/18/2016 16:27       Assessment & Plan:   Problem List Items Addressed This Visit    BMI 39.0-39.9,adult    Discussed diet and exercise.  Follow.        Essential hypertension, benign - Primary    Blood pressure on recheck improved.  Have him spot check his pressure.  Follow.  Same medication regimen.  Check metabolic panel.       Relevant Orders   CBC with Differential/Platelet (Completed)   Basic metabolic panel (Completed)    Hypercholesterolemia    On crestor.  Low cholesterol diet and exercise.  Follow lipid panel and liver function tests.        Relevant Orders   Hepatic function panel (Completed)   Lipid panel (Completed)   Thyroid nodule    Saw Dr Eddie Dibbles.  Last checked 12/27/16.  Stable.  Recommended f/u in two years.            Einar Pheasant, MD

## 2017-06-21 ENCOUNTER — Encounter: Payer: Self-pay | Admitting: Internal Medicine

## 2017-06-23 ENCOUNTER — Encounter: Payer: Self-pay | Admitting: Internal Medicine

## 2017-06-23 NOTE — Assessment & Plan Note (Signed)
Saw Dr Eddie Dibbles.  Last checked 12/27/16.  Stable.  Recommended f/u in two years.

## 2017-06-23 NOTE — Assessment & Plan Note (Signed)
Blood pressure on recheck improved.  Have him spot check his pressure.  Follow.  Same medication regimen.  Check metabolic panel.

## 2017-06-23 NOTE — Assessment & Plan Note (Signed)
On crestor.  Low cholesterol diet and exercise.  Follow lipid panel and liver function tests.   

## 2017-06-23 NOTE — Assessment & Plan Note (Signed)
Discussed diet and exercise.  Follow.  

## 2017-06-24 ENCOUNTER — Other Ambulatory Visit: Payer: Self-pay | Admitting: Internal Medicine

## 2017-07-04 ENCOUNTER — Other Ambulatory Visit: Payer: Self-pay | Admitting: Internal Medicine

## 2017-09-27 ENCOUNTER — Other Ambulatory Visit: Payer: Self-pay | Admitting: Internal Medicine

## 2017-12-10 ENCOUNTER — Encounter: Payer: BC Managed Care – PPO | Admitting: Internal Medicine

## 2017-12-31 ENCOUNTER — Other Ambulatory Visit: Payer: Self-pay | Admitting: Internal Medicine

## 2018-01-06 ENCOUNTER — Other Ambulatory Visit: Payer: Self-pay | Admitting: Internal Medicine

## 2018-01-16 ENCOUNTER — Ambulatory Visit (INDEPENDENT_AMBULATORY_CARE_PROVIDER_SITE_OTHER): Payer: BC Managed Care – PPO | Admitting: Internal Medicine

## 2018-01-16 ENCOUNTER — Encounter: Payer: Self-pay | Admitting: Internal Medicine

## 2018-01-16 VITALS — BP 118/66 | HR 63 | Temp 98.0°F | Resp 18 | Ht 67.0 in | Wt 249.8 lb

## 2018-01-16 DIAGNOSIS — E7800 Pure hypercholesterolemia, unspecified: Secondary | ICD-10-CM | POA: Diagnosis not present

## 2018-01-16 DIAGNOSIS — Z0000 Encounter for general adult medical examination without abnormal findings: Secondary | ICD-10-CM

## 2018-01-16 DIAGNOSIS — E041 Nontoxic single thyroid nodule: Secondary | ICD-10-CM

## 2018-01-16 DIAGNOSIS — R0981 Nasal congestion: Secondary | ICD-10-CM

## 2018-01-16 DIAGNOSIS — Z125 Encounter for screening for malignant neoplasm of prostate: Secondary | ICD-10-CM

## 2018-01-16 DIAGNOSIS — I10 Essential (primary) hypertension: Secondary | ICD-10-CM

## 2018-01-16 LAB — BASIC METABOLIC PANEL
BUN: 9 mg/dL (ref 6–23)
CHLORIDE: 104 meq/L (ref 96–112)
CO2: 31 meq/L (ref 19–32)
Calcium: 10.2 mg/dL (ref 8.4–10.5)
Creatinine, Ser: 1.09 mg/dL (ref 0.40–1.50)
GFR: 75.04 mL/min (ref >60.00)
GLUCOSE: 81 mg/dL (ref 70–99)
POTASSIUM: 4.5 meq/L (ref 3.5–5.1)
SODIUM: 141 meq/L (ref 135–145)

## 2018-01-16 LAB — HEPATIC FUNCTION PANEL
ALBUMIN: 4 g/dL (ref 3.5–5.2)
ALT: 27 U/L (ref 0–53)
AST: 19 U/L (ref 0–37)
Alkaline Phosphatase: 85 U/L (ref 39–117)
BILIRUBIN DIRECT: 0.1 mg/dL (ref 0.0–0.3)
Total Bilirubin: 0.5 mg/dL (ref 0.2–1.2)
Total Protein: 6.9 g/dL (ref 6.0–8.3)

## 2018-01-16 LAB — LIPID PANEL
CHOL/HDL RATIO: 4
CHOLESTEROL: 143 mg/dL (ref 0–200)
HDL: 40.7 mg/dL (ref >39.00)
LDL Cholesterol: 79 mg/dL (ref 0–99)
NonHDL: 102.05
TRIGLYCERIDES: 116 mg/dL (ref 0.0–149.0)
VLDL: 23.2 mg/dL (ref 0.0–40.0)

## 2018-01-16 LAB — PSA: PSA: 0.39 ng/mL (ref 0.10–4.00)

## 2018-01-16 LAB — TSH: TSH: 0.97 u[IU]/mL (ref 0.35–4.50)

## 2018-01-16 MED ORDER — ROSUVASTATIN CALCIUM 20 MG PO TABS: 20.0000 mg | ORAL_TABLET | Freq: Every day | ORAL | 3 refills | Status: DC

## 2018-01-16 MED ORDER — LISINOPRIL 20 MG PO TABS: ORAL_TABLET | ORAL | 3 refills | Status: DC

## 2018-01-16 NOTE — Patient Instructions (Signed)
Saline nasal spray - flush nose at least 2-3x/day  nasacort nasal spray - 2 sprays each nostril one time per day.  Do this in the evening.   Robitussin - twice a day as needed.

## 2018-01-16 NOTE — Progress Notes (Signed)
Patient ID: Samuel Last., male   DOB: [birth year], 52 y.o.   MRN: [id number]   Subjective:    Patient ID: Samuel Last., male    DOB: 27-Nov-1965, 52 y.o.   MRN: [id number]  HPI  Patient here for his physical exam.  Doing well.  Feels good.  Staying active.  Exercising.  Has cut back on soft drinks and some sweets.  Weight is down some.  No chest pain.  No sob.  No acid reflux.  No abdominal pain.  Bowels moving.  No urinary issues.  Overall feels good.  Has noticed some increased sinus pressure.  Worse this past weekend.  Increased post nasal drainage in am.  Some nasal congestion.  No fever. No chest congestion.     Past Medical History:  Diagnosis Date  . History of chicken pox   . Hypercholesterolemia   . Hypertension    Past Surgical History:  Procedure Laterality Date  . TUMOR REMOVAL  7/08 & 5/14   left shoulder, lipoma   Family History  Problem Relation Age of Onset  . Hyperlipidemia Mother   . Hypertension Mother   . Hyperlipidemia Father   . Kidney cancer Father   . Hyperlipidemia Paternal Aunt   . Colon cancer Paternal Uncle   . Prostate cancer Paternal Uncle   . Hyperlipidemia Paternal Uncle   . Hyperlipidemia Paternal Grandmother   . Hyperlipidemia Paternal Grandfather    Social History   Socioeconomic History  . Marital status: Married    Spouse name: Not on file  . Number of children: Not on file  . Years of education: Not on file  . Highest education level: Not on file  Occupational History  . Not on file  Social Needs  . Financial resource strain: Not on file  . Food insecurity:    Worry: Not on file    Inability: Not on file  . Transportation needs:    Medical: Not on file    Non-medical: Not on file  Tobacco Use  . Smoking status: Never Smoker  . Smokeless tobacco: Never Used  Substance and Sexual Activity  . Alcohol use: Yes    Alcohol/week: 0.0 standard drinks    Comment: rarley  . Drug use: No  . Sexual activity: Not  on file  Lifestyle  . Physical activity:    Days per week: Not on file    Minutes per session: Not on file  . Stress: Not on file  Relationships  . Social connections:    Talks on phone: Not on file    Gets together: Not on file    Attends religious service: Not on file    Active member of club or organization: Not on file    Attends meetings of clubs or organizations: Not on file    Relationship status: Not on file  Other Topics Concern  . Not on file  Social History Narrative  . Not on file    Outpatient Encounter Medications as of 01/16/2018  Medication Sig  . aspirin 81 MG tablet Take 81 mg by mouth daily.  Marland Kitchen lisinopril (PRINIVIL,ZESTRIL) 20 MG tablet Take one tablet daily.  . rosuvastatin (CRESTOR) 20 MG tablet Take 1 tablet (20 mg total) by mouth daily.  . [DISCONTINUED] lisinopril (PRINIVIL,ZESTRIL) 20 MG tablet Take 1 tablet (20 mg total) by mouth daily.  . [DISCONTINUED] rosuvastatin (CRESTOR) 20 MG tablet Take 1 tablet (20 mg total) by mouth daily.   No facility-administered encounter medications  on file as of 01/16/2018.     Review of Systems  Constitutional: Negative for appetite change and unexpected weight change.  HENT: Positive for congestion, postnasal drip and sinus pressure. Negative for sore throat.   Eyes: Negative for pain and visual disturbance.  Respiratory: Negative for cough, chest tightness and shortness of breath.   Cardiovascular: Negative for chest pain, palpitations and leg swelling.  Gastrointestinal: Negative for abdominal pain, diarrhea, nausea and vomiting.  Genitourinary: Negative for difficulty urinating and dysuria.  Musculoskeletal: Negative for joint swelling and myalgias.  Skin: Negative for color change and rash.  Neurological: Negative for dizziness, light-headedness and headaches.  Hematological: Negative for adenopathy. Does not bruise/bleed easily.  Psychiatric/Behavioral: Negative for agitation and dysphoric mood.         Objective:     Blood pressure rechecked by me:  124/78  Physical Exam  Constitutional: He is oriented to person, place, and time. He appears well-developed and well-nourished. No distress.  HENT:  Head: Normocephalic and atraumatic.  Nose: Nose normal.  Mouth/Throat: Oropharynx is clear and moist. No oropharyngeal exudate.  TMs without erythema.  No tenderness to palpation over the sinuses.    Eyes: Conjunctivae are normal. Right eye exhibits no discharge. Left eye exhibits no discharge.  Neck: Neck supple. No thyromegaly present.  Cardiovascular: Normal rate and regular rhythm.  Pulmonary/Chest: Breath sounds normal. No respiratory distress. He has no wheezes.  Abdominal: Soft. Bowel sounds are normal. There is no tenderness.  Genitourinary:  Genitourinary Comments: Not performed.    Musculoskeletal: He exhibits no edema or tenderness.  Lymphadenopathy:    He has no cervical adenopathy.  Neurological: He is alert and oriented to person, place, and time.  Skin: No rash noted. No erythema.  Psychiatric: He has a normal mood and affect. His behavior is normal.    BP 118/66 (BP Location: Left Arm, Patient Position: Sitting, Cuff Size: Large)   Pulse 63   Temp 98 F (36.7 C) (Oral)   Resp 18   Ht 5\' 7"  (1.702 m)   Wt 249 lb 12.8 oz (113.3 kg)   SpO2 96%   BMI 39.12 kg/m  Wt Readings from Last 3 Encounters:  01/16/18 249 lb 12.8 oz (113.3 kg)  06/20/17 256 lb 6.4 oz (116.3 kg)  12/05/16 250 lb 6.4 oz (113.6 kg)     Lab Results  Component Value Date   WBC 5.7 06/20/2017   HGB 16.2 06/20/2017   HCT 47.5 06/20/2017   PLT 272.0 06/20/2017   GLUCOSE 81 01/16/2018   CHOL 143 01/16/2018   TRIG 116.0 01/16/2018   HDL 40.70 01/16/2018   LDLDIRECT 166.3 05/08/2013   LDLCALC 79 01/16/2018   ALT 27 01/16/2018   AST 19 01/16/2018   NA 141 01/16/2018   K 4.5 01/16/2018   CL 104 01/16/2018   CREATININE 1.09 01/16/2018   BUN 9 01/16/2018   CO2 31 01/16/2018   TSH 0.97  01/16/2018   PSA 0.39 01/16/2018   HGBA1C 5.6 12/05/2016    US Thyroid  Result Date: 12/18/2016 CLINICAL DATA:  Nontoxic multinodular goiter EXAM: THYROID ULTRASOUND TECHNIQUE: Ultrasound examination of the thyroid gland and adjacent soft tissues was performed. COMPARISON:  12/07/2015 and previous back to 04/27/2014 FINDINGS: Parenchymal Echotexture: Moderately heterogenous Isthmus: 0.9 cm thickness, previously 0.8 Right lobe: 5.8 x 3.4 x 2.9 cm, previously 6 x 3.2 x 3.4 Left lobe: 5 x 3.2 x 3.2 cm, previously 5.9 x 3.1 x 3.3 _________________________________________________________ Estimated total number of nodules >/=  1 cm: 5 Number of spongiform nodules >/=  2 cm not described below (TR1): 0 Number of mixed cystic and solid nodules >/= 1.5 cm not described below ([place]): 0 _________________________________________________________ Nodule # 1: Prior biopsy: No Location: Right; Superior Maximum size: 1.7 cm; Other 2 dimensions: 1.5 x 1.1 cm, previously, 1.5 x 0.9 x 1.3 cm Composition: mixed cystic and solid (1) Echogenicity: hypoechoic (2) Shape: not taller-than-wide (0) Margins: ill-defined (0) Echogenic foci: none (0) ACR TI-RADS total points: 3. ACR TI-RADS risk category:  TR3 (3 points). Significant change in size (>/= 20% in two dimensions and minimal increase of 2 mm): No Change in features: No Change in ACR TI-RADS risk category: No ACR TI-RADS recommendations: *Given size (>/= 1.5 - 2.4 cm) and appearance, a follow-up ultrasound in 1 year should be considered based on TI-RADS criteria. _________________________________________________________ Nodule # 2: Prior biopsy: No Location: Right; Inferior Maximum size: 3.4 cm; Other 2 dimensions: 2.9 x 2.5 cm, previously, 3.2 x 3.1 x 2.7 cm Composition: solid/almost completely solid (2) Echogenicity: isoechoic (1) Shape: taller-than-wide (3) Margins: ill-defined (0) Echogenic foci: none (0) ACR TI-RADS total points: 6. ACR TI-RADS risk category:  TR4 (4-6  points). Significant change in size (>/= 20% in two dimensions and minimal increase of 2 mm): No Change in features: No Change in ACR TI-RADS risk category: No ACR TI-RADS recommendations: **Given size (>/= 1.5 cm) and appearance, fine needle aspiration of this moderately suspicious nodule should be considered based on TI-RADS criteria. _________________________________________________________ Nodule # 3: Prior biopsy: No Location: Right; Mid Maximum size: 1.6 cm; Other 2 dimensions: 0.6 x 1.5 cm, previously, 1.7 x 0.6 x 1.5 cm Composition: solid/almost completely solid (2) Echogenicity: isoechoic (1) Shape: not taller-than-wide (0) Margins: ill-defined (0) Echogenic foci: none (0) ACR TI-RADS total points: 3. ACR TI-RADS risk category:  TR3 (3 points). Significant change in size (>/= 20% in two dimensions and minimal increase of 2 mm): No Change in features: No Change in ACR TI-RADS risk category: No ACR TI-RADS recommendations: *Given size (>/= 1.5 - 2.4 cm) and appearance, a follow-up ultrasound in 1 year should be considered based on TI-RADS criteria. _________________________________________________________ Nodule # 4: Prior biopsy: No Location: Left; Inferior Maximum size: 3.9 cm; Other 2 dimensions: 2.9 x 3.4 cm, previously, 3.8 x 2.9 x 3 cm Composition: solid/almost completely solid (2) Echogenicity: hypoechoic (2) Shape: taller-than-wide (3) Margins: ill-defined (0) Echogenic foci: none (0) ACR TI-RADS total points: 7. ACR TI-RADS risk category:  TR5 (>/= 7 points). Significant change in size (>/= 20% in two dimensions and minimal increase of 2 mm): No Change in features: No Change in ACR TI-RADS risk category: No ACR TI-RADS recommendations: **Given size (>/= 1.0 cm) and appearance, fine needle aspiration of this highly suspicious nodule should be considered based on TI-RADS criteria. _________________________________________________________ Nodule # 5: Prior biopsy: No Location: Isthmus; left of midline  Maximum size: 1.2 cm; Other 2 dimensions: 0.6 x 1 cm, previously, 1 x 0.9 x 0.7 cm Composition: mixed cystic and solid (1) Echogenicity: hypoechoic (2) Shape: not taller-than-wide (0) Margins: ill-defined (0) Echogenic foci: none (0) ACR TI-RADS total points: 3. ACR TI-RADS risk category:  TR3 (3 points). Significant change in size (>/= 20% in two dimensions and minimal increase of 2 mm): No Change in features: No Change in ACR TI-RADS risk category: No ACR TI-RADS recommendations: Given size (<1.4 cm) and appearance, this nodule does NOT meet TI-RADS criteria for biopsy or dedicated follow-up. _________________________________________________________ IMPRESSION: 1. Stable thyromegaly with multiple nodules.  2. Recommend FNA biopsy of highly suspicious 3.9 cm inferior left and moderately suspicious 3.4 cm inferior right nodules. 3. Recommend 1-2 year follow-up surveillance ultrasound of additional lesions as above. The above is in keeping with the ACR TI-RADS recommendations - J Am Coll Radiol 2017;14:587-595. Electronically Signed   By: Lucrezia Europe M.D.   On: 12/18/2016 16:27       Assessment & Plan:   Problem List Items Addressed This Visit    Essential hypertension, benign    Blood pressure under good control.  Continue same medication regimen.  Follow pressures.  Follow metabolic panel.        Relevant Medications   lisinopril (PRINIVIL,ZESTRIL) 20 MG tablet   rosuvastatin (CRESTOR) 20 MG tablet   Other Relevant Orders   Basic metabolic panel (Completed)   Health care maintenance    Physical today 01/16/18.  Colonoscopy 03/2015 - diverticulosis.  Check psa today.        Hypercholesterolemia    On crestor.  Low cholesterol diet and exercise.  Follow lipid panel and liver function tests.        Relevant Medications   lisinopril (PRINIVIL,ZESTRIL) 20 MG tablet   rosuvastatin (CRESTOR) 20 MG tablet   Other Relevant Orders   Hepatic function panel (Completed)   Lipid panel (Completed)    Thyroid nodule    Saw Dr Eddie Dibbles.  Last checked 12/27/16.  Recommended f/u in 2 years.        Relevant Orders   TSH (Completed)    Other Visit Diagnoses    Routine general medical examination at a health care facility    -  Primary   Prostate cancer screening       Relevant Orders   PSA (Completed)   Nasal congestion       Saline nasal spray and nasacort nasal srpay as directed.  Notify me if worsening or persistent problems.         Samuel Pheasant, MD

## 2018-01-16 NOTE — Assessment & Plan Note (Signed)
Physical today 01/16/18.  Colonoscopy 03/2015 - diverticulosis.  Check psa today.

## 2018-01-17 ENCOUNTER — Encounter: Payer: Self-pay | Admitting: Internal Medicine

## 2018-01-19 ENCOUNTER — Encounter: Payer: Self-pay | Admitting: Internal Medicine

## 2018-01-19 NOTE — Assessment & Plan Note (Signed)
On crestor.  Low cholesterol diet and exercise.  Follow lipid panel and liver function tests.   

## 2018-01-19 NOTE — Assessment & Plan Note (Signed)
Saw Dr Eddie Dibbles.  Last checked 12/27/16.  Recommended f/u in 2 years.

## 2018-01-19 NOTE — Assessment & Plan Note (Signed)
Blood pressure under good control.  Continue same medication regimen.  Follow pressures.  Follow metabolic panel.   

## 2018-05-15 IMAGING — US US THYROID
1 series · 16 of 25 positions shown · non-contrast
Comparison: 12/07/2015 and previous back to 04/27/2014

CLINICAL DATA: Nontoxic multinodular goiter

EXAM:
THYROID ULTRASOUND
TECHNIQUE: Ultrasound examination of the thyroid gland and adjacent soft
tissues was performed.

[Series 1: us thyroid · 0.08mm/px · 16 of 93 slices shown]
[im 1/93]
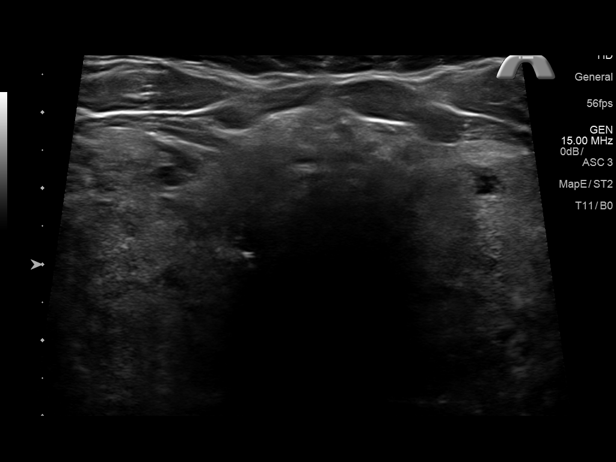
[im 8/93]
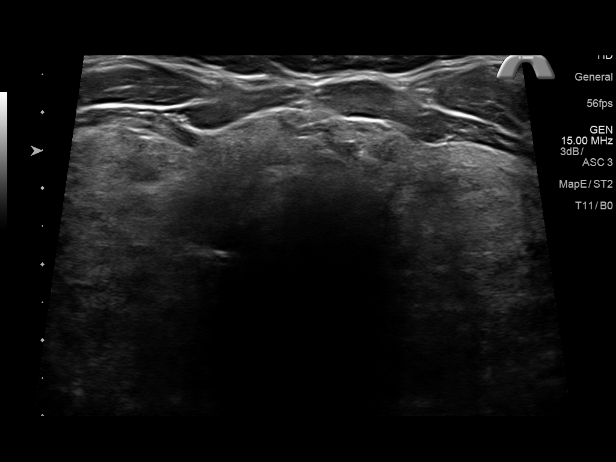
[im 12/93]
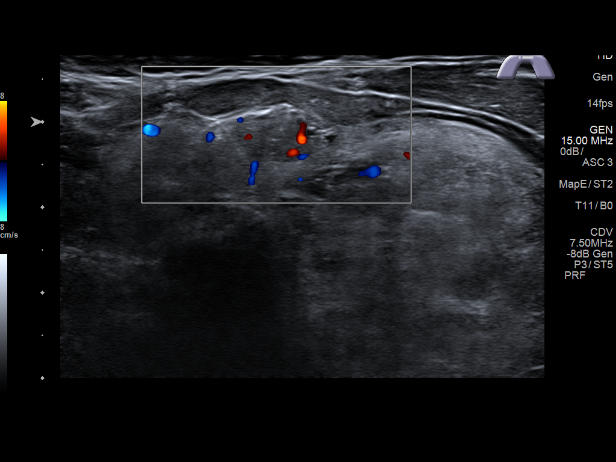
[im 20/93]
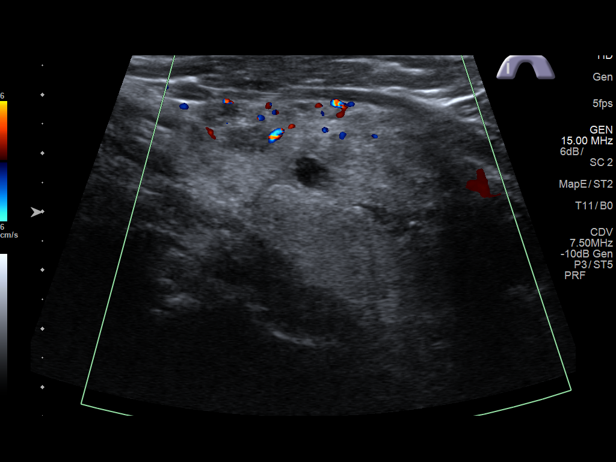
[im 27/93]
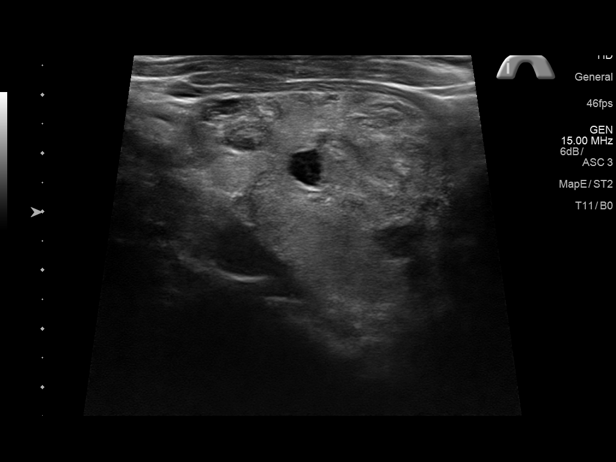
[im 31/93]
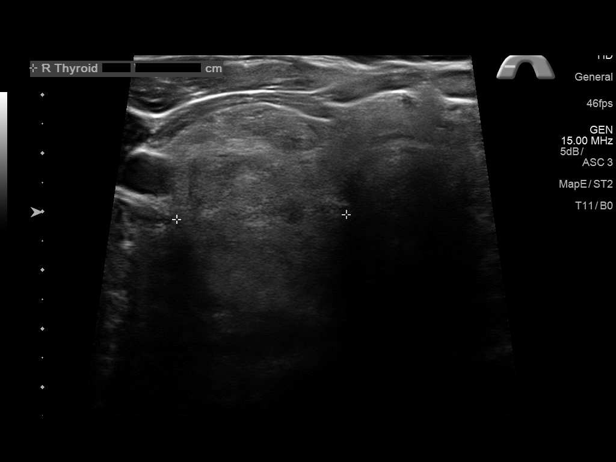
[im 39/93]
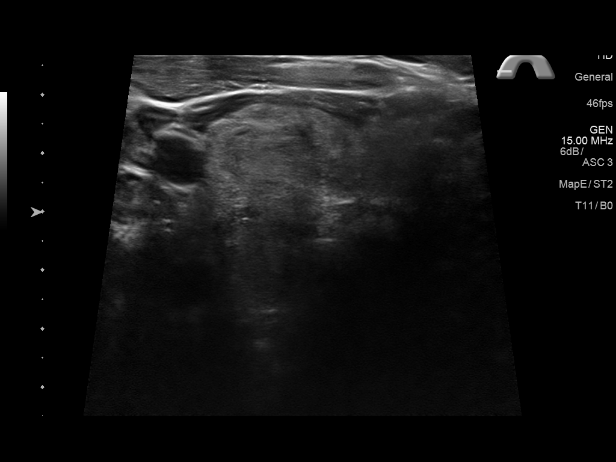
[im 43/93]
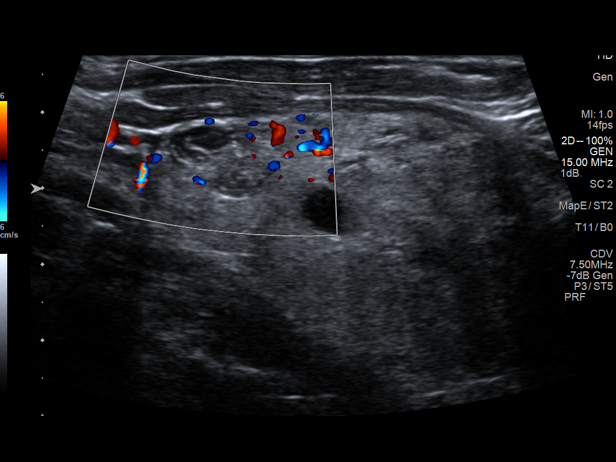
[im 50/93]
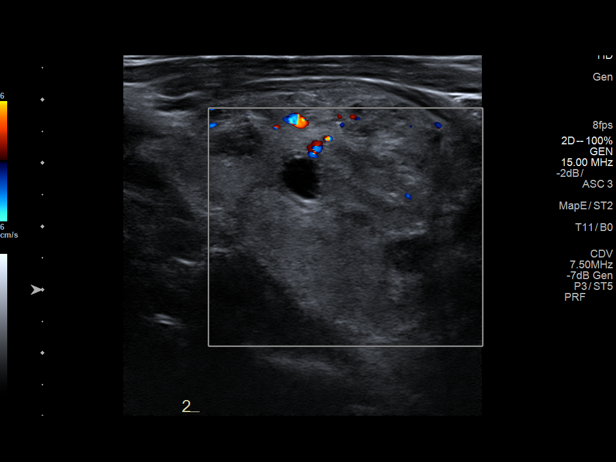
[im 54/93]
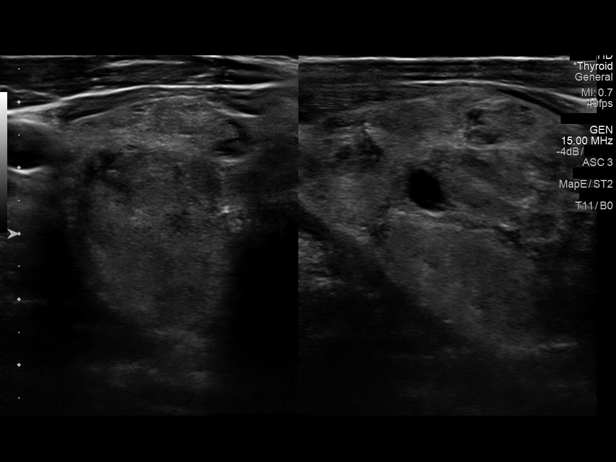
[im 62/93]
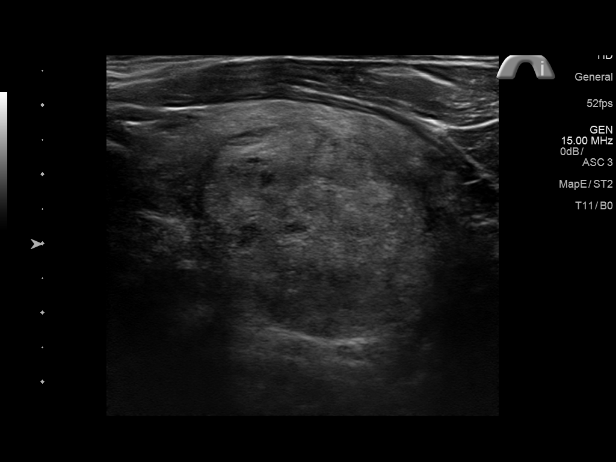
[im 66/93]
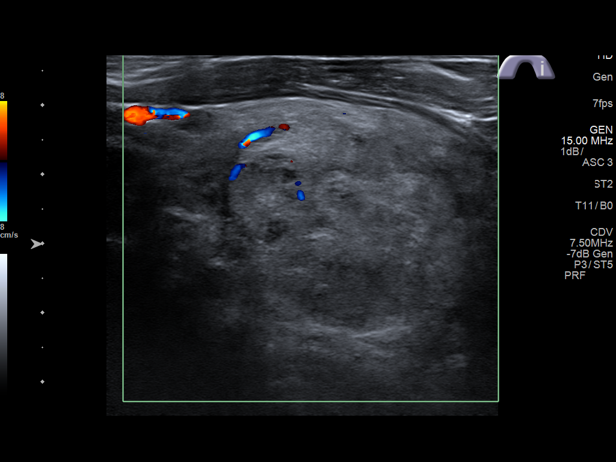
[im 73/93]
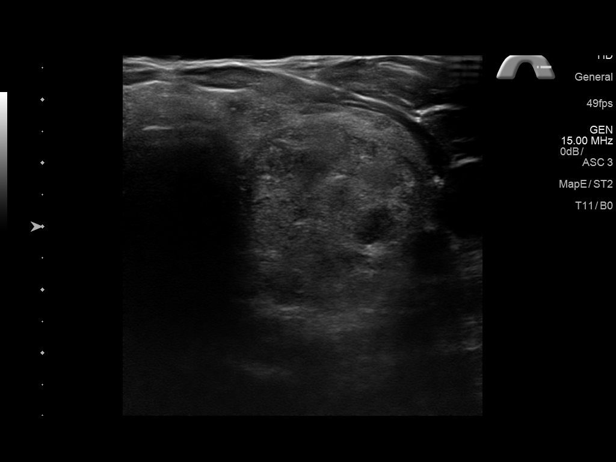
[im 81/93]
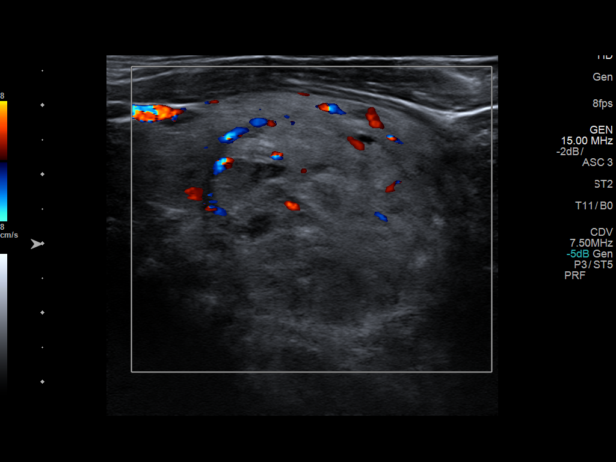
[im 85/93]
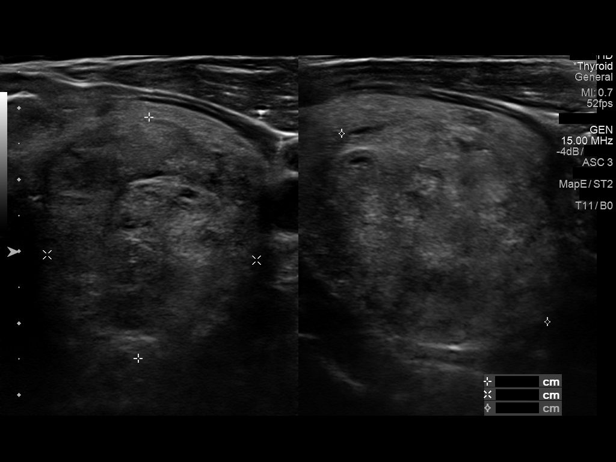
[im 93/93]
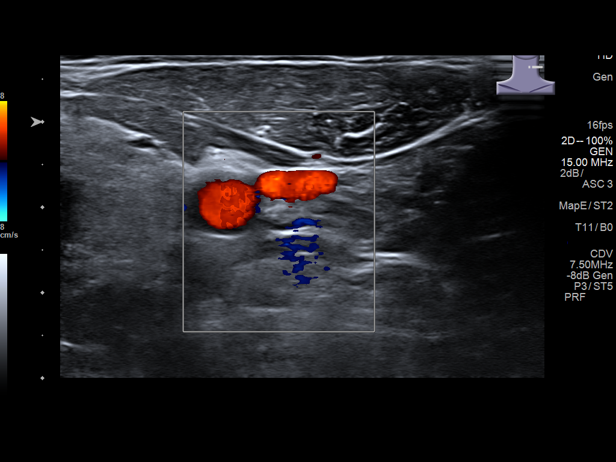

[16 of 25 positions shown; findings below may reference images not displayed]

FINDINGS: Parenchymal Echotexture: Moderately heterogenous

Isthmus: 0.9 cm thickness, previously

Right lobe: 5.8 x 3.4 x 2.9 cm, previously 6 x 3.2 x

Left lobe: 5 x 3.2 x 3.2 cm, previously 5.9 x 3.1 x

_________________________________________________________

Estimated total number of nodules >/= 1 cm: 5

Number of spongiform nodules >/=  2 cm not described below (TR1): 0

Number of mixed cystic and solid nodules >/= 1.5 cm not described
below (TR2): 0

_________________________________________________________

Nodule # 1:

Prior biopsy: No

Location: Right; Superior

Maximum size: 1.7 cm; Other 2 dimensions: 1.5 x 1.1 cm, previously,
1.5 x 0.9 x 1.3 cm

Composition: mixed cystic and solid (1)

Echogenicity: hypoechoic (2)

Shape: not taller-than-wide (0)

Margins: ill-defined (0)

Echogenic foci: none (0)

ACR TI-RADS total points: 3.

ACR TI-RADS risk category:  TR3 (3 points).

Significant change in size (>/= 20% in two dimensions and minimal
increase of 2 mm): No

Change in features: No

Change in ACR TI-RADS risk category: No

ACR TI-RADS recommendations:

*Given size (>/= 1.5 - 2.4 cm) and appearance, a follow-up
ultrasound in 1 year should be considered based on TI-RADS criteria.

_________________________________________________________

Nodule # 2:

Prior biopsy: No

Location: Right; Inferior

Maximum size: 3.4 cm; Other 2 dimensions: 2.9 x 2.5 cm, previously,
3.2 x 3.1 x 2.7 cm

Composition: solid/almost completely solid (2)

Echogenicity: isoechoic (1)

Shape: taller-than-wide (3)

Margins: ill-defined (0)

Echogenic foci: none (0)

ACR TI-RADS total points: 6.

ACR TI-RADS risk category:  TR4 (4-6 points).

Significant change in size (>/= 20% in two dimensions and minimal
increase of 2 mm): No

Change in features: No

Change in ACR TI-RADS risk category: No

ACR TI-RADS recommendations:

**Given size (>/= 1.5 cm) and appearance, fine needle aspiration of
this moderately suspicious nodule should be considered based on
TI-RADS criteria.

_________________________________________________________

Nodule # 3:

Prior biopsy: No

Location: Right; Mid

Maximum size: 1.6 cm; Other 2 dimensions: 0.6 x 1.5 cm, previously,
1.7 x 0.6 x 1.5 cm

Composition: solid/almost completely solid (2)

Echogenicity: isoechoic (1)

Shape: not taller-than-wide (0)

Margins: ill-defined (0)

Echogenic foci: none (0)

ACR TI-RADS total points: 3.

ACR TI-RADS risk category:  TR3 (3 points).

Significant change in size (>/= 20% in two dimensions and minimal
increase of 2 mm): No

Change in features: No

Change in ACR TI-RADS risk category: No

ACR TI-RADS recommendations:

*Given size (>/= 1.5 - 2.4 cm) and appearance, a follow-up
ultrasound in 1 year should be considered based on TI-RADS criteria.

_________________________________________________________

Nodule # 4:

Prior biopsy: No

Location: Left; Inferior

Maximum size: 3.9 cm; Other 2 dimensions: 2.9 x 3.4 cm, previously,
3.8 x 2.9 x 3 cm

Composition: solid/almost completely solid (2)

Echogenicity: hypoechoic (2)

Shape: taller-than-wide (3)

Margins: ill-defined (0)

Echogenic foci: none (0)

ACR TI-RADS total points: 7.

ACR TI-RADS risk category:  TR5 (>/= 7 points).

Significant change in size (>/= 20% in two dimensions and minimal
increase of 2 mm): No

Change in features: No

Change in ACR TI-RADS risk category: No

ACR TI-RADS recommendations:

**Given size (>/= 1.0 cm) and appearance, fine needle aspiration of
this highly suspicious nodule should be considered based on TI-RADS
criteria.

_________________________________________________________

Nodule # 5:

Prior biopsy: No

Location: Isthmus; left of midline

Maximum size: 1.2 cm; Other 2 dimensions: 0.6 x 1 cm, previously, 1
x 0.9 x 0.7 cm

Composition: mixed cystic and solid (1)

Echogenicity: hypoechoic (2)

Shape: not taller-than-wide (0)

Margins: ill-defined (0)

Echogenic foci: none (0)

ACR TI-RADS total points: 3.

ACR TI-RADS risk category:  TR3 (3 points).

Significant change in size (>/= 20% in two dimensions and minimal
increase of 2 mm): No

Change in features: No

Change in ACR TI-RADS risk category: No

ACR TI-RADS recommendations:

Given size (<1.4 cm) and appearance, this nodule does NOT meet
TI-RADS criteria for biopsy or dedicated follow-up.

_________________________________________________________
IMPRESSION: 1. Stable thyromegaly with multiple nodules.
2. Recommend FNA biopsy of highly suspicious 3.9 cm inferior left
and moderately suspicious 3.4 cm inferior right nodules.
3. Recommend 1-2 year follow-up surveillance ultrasound of
additional lesions as above.

The above is in keeping with the ACR TI-RADS recommendations - [HOSPITAL] 2196;[DATE].

## 2018-05-21 ENCOUNTER — Encounter: Payer: Self-pay | Admitting: Internal Medicine

## 2018-05-21 DIAGNOSIS — E041 Nontoxic single thyroid nodule: Secondary | ICD-10-CM

## 2018-05-22 NOTE — Telephone Encounter (Signed)
Order placed for endocrinology referral.  

## 2018-07-17 ENCOUNTER — Encounter: Payer: Self-pay | Admitting: Internal Medicine

## 2018-07-17 ENCOUNTER — Ambulatory Visit: Payer: BC Managed Care – PPO | Admitting: Internal Medicine

## 2018-07-17 ENCOUNTER — Other Ambulatory Visit: Payer: Self-pay

## 2018-07-17 VITALS — BP 132/78 | HR 67 | Temp 97.7°F | Resp 16 | Wt 253.6 lb

## 2018-07-17 DIAGNOSIS — E7800 Pure hypercholesterolemia, unspecified: Secondary | ICD-10-CM | POA: Diagnosis not present

## 2018-07-17 DIAGNOSIS — I10 Essential (primary) hypertension: Secondary | ICD-10-CM

## 2018-07-17 DIAGNOSIS — Z6839 Body mass index (BMI) 39.0-39.9, adult: Secondary | ICD-10-CM

## 2018-07-17 DIAGNOSIS — E041 Nontoxic single thyroid nodule: Secondary | ICD-10-CM

## 2018-07-17 LAB — BASIC METABOLIC PANEL
BUN: 11 mg/dL (ref 6–23)
CALCIUM: 10.4 mg/dL (ref 8.4–10.5)
CO2: 30 meq/L (ref 19–32)
Chloride: 105 mEq/L (ref 96–112)
Creatinine, Ser: 1.18 mg/dL (ref 0.40–1.50)
GFR: 64.3 mL/min (ref >60.00)
GLUCOSE: 97 mg/dL (ref 70–99)
Potassium: 4.8 mEq/L (ref 3.5–5.1)
SODIUM: 140 meq/L (ref 135–145)

## 2018-07-17 LAB — HEPATIC FUNCTION PANEL
ALT: 27 U/L (ref 0–53)
AST: 17 U/L (ref 0–37)
Albumin: 4.1 g/dL (ref 3.5–5.2)
Alkaline Phosphatase: 81 U/L (ref 39–117)
BILIRUBIN TOTAL: 0.5 mg/dL (ref 0.2–1.2)
Bilirubin, Direct: 0.1 mg/dL (ref 0.0–0.3)
Total Protein: 6.8 g/dL (ref 6.0–8.3)

## 2018-07-17 LAB — CBC WITH DIFFERENTIAL/PLATELET
Basophils Absolute: 0 10*3/uL (ref 0.0–0.1)
Basophils Relative: 0.9 % (ref 0.0–3.0)
Eosinophils Absolute: 0.2 10*3/uL (ref 0.0–0.7)
Eosinophils Relative: 3.5 % (ref 0.0–5.0)
HCT: 46.5 % (ref 39.0–52.0)
HEMOGLOBIN: 15.9 g/dL (ref 13.0–17.0)
Lymphocytes Relative: 29.4 % (ref 12.0–46.0)
Lymphs Abs: 1.6 10*3/uL (ref 0.7–4.0)
MCHC: 34.1 g/dL (ref 30.0–36.0)
MCV: 85.6 fl (ref 78.0–100.0)
MONOS PCT: 12.3 % — AB (ref 3.0–12.0)
Monocytes Absolute: 0.7 10*3/uL (ref 0.1–1.0)
Neutro Abs: 2.9 10*3/uL (ref 1.4–7.7)
Neutrophils Relative %: 53.9 % (ref 43.0–77.0)
Platelets: 256 10*3/uL (ref 150.0–400.0)
RBC: 5.43 Mil/uL (ref 4.22–5.81)
RDW: 13.6 % (ref 11.5–15.5)
WBC: 5.3 10*3/uL (ref 4.0–10.5)

## 2018-07-17 LAB — LIPID PANEL
CHOLESTEROL: 145 mg/dL (ref 0–200)
HDL: 40.7 mg/dL (ref >39.00)
LDL CALC: 86 mg/dL (ref 0–99)
NonHDL: 104.32
TRIGLYCERIDES: 90 mg/dL (ref 0.0–149.0)
Total CHOL/HDL Ratio: 4
VLDL: 18 mg/dL (ref 0.0–40.0)

## 2018-07-17 LAB — TSH: TSH: 0.86 u[IU]/mL (ref 0.35–4.50)

## 2018-07-17 NOTE — Assessment & Plan Note (Signed)
On crestor.  Low cholesterol diet and exercise.  Follow lipid panel and liver function tests.   

## 2018-07-17 NOTE — Progress Notes (Signed)
Patient ID: Samuel Last., male   DOB: 1966-01-17, 53 y.o.   MRN: [id number]   Subjective:    Patient ID: Samuel Last., male    DOB: June 12, 1965, 53 y.o.   MRN: [id number]  HPI  Patient here for a scheduled follow up.  States he is doing well.  Feels good.  Stays active.  No chest pain.  No sob.  No acid reflux.  No abdominal pain.  Bowels moving.  No urine change.  Trying to exercise.  Plans to do more.  Overall feels good.       Past Medical History:  Diagnosis Date  . History of chicken pox   . Hypercholesterolemia   . Hypertension    Past Surgical History:  Procedure Laterality Date  . TUMOR REMOVAL  7/08 & 5/14   left shoulder, lipoma   Family History  Problem Relation Age of Onset  . Hyperlipidemia Mother   . Hypertension Mother   . Hyperlipidemia Father   . Kidney cancer Father   . Hyperlipidemia Paternal Aunt   . Colon cancer Paternal Uncle   . Prostate cancer Paternal Uncle   . Hyperlipidemia Paternal Uncle   . Hyperlipidemia Paternal Grandmother   . Hyperlipidemia Paternal Grandfather    Social History   Socioeconomic History  . Marital status: Married    Spouse name: Not on file  . Number of children: Not on file  . Years of education: Not on file  . Highest education level: Not on file  Occupational History  . Not on file  Social Needs  . Financial resource strain: Not on file  . Food insecurity:    Worry: Not on file    Inability: Not on file  . Transportation needs:    Medical: Not on file    Non-medical: Not on file  Tobacco Use  . Smoking status: Never Smoker  . Smokeless tobacco: Never Used  Substance and Sexual Activity  . Alcohol use: Yes    Alcohol/week: 0.0 standard drinks    Comment: rarley  . Drug use: No  . Sexual activity: Not on file  Lifestyle  . Physical activity:    Days per week: Not on file    Minutes per session: Not on file  . Stress: Not on file  Relationships  . Social connections:    Talks on  phone: Not on file    Gets together: Not on file    Attends religious service: Not on file    Active member of club or organization: Not on file    Attends meetings of clubs or organizations: Not on file    Relationship status: Not on file  Other Topics Concern  . Not on file  Social History Narrative  . Not on file    Outpatient Encounter Medications as of 07/17/2018  Medication Sig  . aspirin 81 MG tablet Take 81 mg by mouth daily.  Marland Kitchen lisinopril (PRINIVIL,ZESTRIL) 20 MG tablet Take one tablet daily.  . rosuvastatin (CRESTOR) 20 MG tablet Take 1 tablet (20 mg total) by mouth daily.   No facility-administered encounter medications on file as of 07/17/2018.     Review of Systems  Constitutional: Negative for appetite change and unexpected weight change.  HENT: Negative for congestion and sinus pressure.   Respiratory: Negative for cough, chest tightness and shortness of breath.   Cardiovascular: Negative for chest pain and palpitations.  Gastrointestinal: Negative for abdominal pain, diarrhea, nausea and vomiting.  Genitourinary: Negative for  difficulty urinating and dysuria.  Musculoskeletal: Negative for joint swelling and myalgias.  Skin: Negative for color change and rash.  Neurological: Negative for dizziness, light-headedness and headaches.  Psychiatric/Behavioral: Negative for agitation and dysphoric mood.       Objective:    Physical Exam Constitutional:      General: He is not in acute distress.    Appearance: Normal appearance. He is well-developed.  HENT:     Nose: Nose normal. No congestion.     Mouth/Throat:     Pharynx: No oropharyngeal exudate or posterior oropharyngeal erythema.  Neck:     Musculoskeletal: Neck supple. No muscular tenderness.  Cardiovascular:     Rate and Rhythm: Normal rate and regular rhythm.  Pulmonary:     Effort: Pulmonary effort is normal. No respiratory distress.     Breath sounds: Normal breath sounds.  Abdominal:     General:  Bowel sounds are normal.     Palpations: Abdomen is soft.     Tenderness: There is no abdominal tenderness.  Musculoskeletal:        General: No swelling or tenderness.  Lymphadenopathy:     Cervical: No cervical adenopathy.  Skin:    Findings: No erythema or rash.  Neurological:     Mental Status: He is alert.  Psychiatric:        Mood and Affect: Mood normal.        Behavior: Behavior normal.     BP 132/78   Pulse 67   Temp 97.7 F (36.5 C) (Oral)   Resp 16   Wt 253 lb 9.6 oz (115 kg)   SpO2 97%   BMI 39.72 kg/m  Wt Readings from Last 3 Encounters:  07/17/18 253 lb 9.6 oz (115 kg)  01/16/18 249 lb 12.8 oz (113.3 kg)  06/20/17 256 lb 6.4 oz (116.3 kg)     Lab Results  Component Value Date   WBC 5.7 06/20/2017   HGB 16.2 06/20/2017   HCT 47.5 06/20/2017   PLT 272.0 06/20/2017   GLUCOSE 81 01/16/2018   CHOL 143 01/16/2018   TRIG 116.0 01/16/2018   HDL 40.70 01/16/2018   LDLDIRECT 166.3 05/08/2013   LDLCALC 79 01/16/2018   ALT 27 01/16/2018   AST 19 01/16/2018   NA 141 01/16/2018   K 4.5 01/16/2018   CL 104 01/16/2018   CREATININE 1.09 01/16/2018   BUN 9 01/16/2018   CO2 31 01/16/2018   TSH 0.97 01/16/2018   PSA 0.39 01/16/2018   HGBA1C 5.6 12/05/2016    US Thyroid  Result Date: 12/18/2016 CLINICAL DATA:  Nontoxic multinodular goiter EXAM: THYROID ULTRASOUND TECHNIQUE: Ultrasound examination of the thyroid gland and adjacent soft tissues was performed. COMPARISON:  12/07/2015 and previous back to 04/27/2014 FINDINGS: Parenchymal Echotexture: Moderately heterogenous Isthmus: 0.9 cm thickness, previously 0.8 Right lobe: 5.8 x 3.4 x 2.9 cm, previously 6 x 3.2 x 3.4 Left lobe: 5 x 3.2 x 3.2 cm, previously 5.9 x 3.1 x 3.3 _________________________________________________________ Estimated total number of nodules >/= 1 cm: 5 Number of spongiform nodules >/=  2 cm not described below (TR1): 0 Number of mixed cystic and solid nodules >/= 1.5 cm not described below  ([place]): 0 _________________________________________________________ Nodule # 1: Prior biopsy: No Location: Right; Superior Maximum size: 1.7 cm; Other 2 dimensions: 1.5 x 1.1 cm, previously, 1.5 x 0.9 x 1.3 cm Composition: mixed cystic and solid (1) Echogenicity: hypoechoic (2) Shape: not taller-than-wide (0) Margins: ill-defined (0) Echogenic foci: none (0) ACR TI-RADS total  points: 3. ACR TI-RADS risk category:  TR3 (3 points). Significant change in size (>/= 20% in two dimensions and minimal increase of 2 mm): No Change in features: No Change in ACR TI-RADS risk category: No ACR TI-RADS recommendations: *Given size (>/= 1.5 - 2.4 cm) and appearance, a follow-up ultrasound in 1 year should be considered based on TI-RADS criteria. _________________________________________________________ Nodule # 2: Prior biopsy: No Location: Right; Inferior Maximum size: 3.4 cm; Other 2 dimensions: 2.9 x 2.5 cm, previously, 3.2 x 3.1 x 2.7 cm Composition: solid/almost completely solid (2) Echogenicity: isoechoic (1) Shape: taller-than-wide (3) Margins: ill-defined (0) Echogenic foci: none (0) ACR TI-RADS total points: 6. ACR TI-RADS risk category:  TR4 (4-6 points). Significant change in size (>/= 20% in two dimensions and minimal increase of 2 mm): No Change in features: No Change in ACR TI-RADS risk category: No ACR TI-RADS recommendations: **Given size (>/= 1.5 cm) and appearance, fine needle aspiration of this moderately suspicious nodule should be considered based on TI-RADS criteria. _________________________________________________________ Nodule # 3: Prior biopsy: No Location: Right; Mid Maximum size: 1.6 cm; Other 2 dimensions: 0.6 x 1.5 cm, previously, 1.7 x 0.6 x 1.5 cm Composition: solid/almost completely solid (2) Echogenicity: isoechoic (1) Shape: not taller-than-wide (0) Margins: ill-defined (0) Echogenic foci: none (0) ACR TI-RADS total points: 3. ACR TI-RADS risk category:  TR3 (3 points). Significant change in  size (>/= 20% in two dimensions and minimal increase of 2 mm): No Change in features: No Change in ACR TI-RADS risk category: No ACR TI-RADS recommendations: *Given size (>/= 1.5 - 2.4 cm) and appearance, a follow-up ultrasound in 1 year should be considered based on TI-RADS criteria. _________________________________________________________ Nodule # 4: Prior biopsy: No Location: Left; Inferior Maximum size: 3.9 cm; Other 2 dimensions: 2.9 x 3.4 cm, previously, 3.8 x 2.9 x 3 cm Composition: solid/almost completely solid (2) Echogenicity: hypoechoic (2) Shape: taller-than-wide (3) Margins: ill-defined (0) Echogenic foci: none (0) ACR TI-RADS total points: 7. ACR TI-RADS risk category:  TR5 (>/= 7 points). Significant change in size (>/= 20% in two dimensions and minimal increase of 2 mm): No Change in features: No Change in ACR TI-RADS risk category: No ACR TI-RADS recommendations: **Given size (>/= 1.0 cm) and appearance, fine needle aspiration of this highly suspicious nodule should be considered based on TI-RADS criteria. _________________________________________________________ Nodule # 5: Prior biopsy: No Location: Isthmus; left of midline Maximum size: 1.2 cm; Other 2 dimensions: 0.6 x 1 cm, previously, 1 x 0.9 x 0.7 cm Composition: mixed cystic and solid (1) Echogenicity: hypoechoic (2) Shape: not taller-than-wide (0) Margins: ill-defined (0) Echogenic foci: none (0) ACR TI-RADS total points: 3. ACR TI-RADS risk category:  TR3 (3 points). Significant change in size (>/= 20% in two dimensions and minimal increase of 2 mm): No Change in features: No Change in ACR TI-RADS risk category: No ACR TI-RADS recommendations: Given size (<1.4 cm) and appearance, this nodule does NOT meet TI-RADS criteria for biopsy or dedicated follow-up. _________________________________________________________ IMPRESSION: 1. Stable thyromegaly with multiple nodules. 2. Recommend FNA biopsy of highly suspicious 3.9 cm inferior left and  moderately suspicious 3.4 cm inferior right nodules. 3. Recommend 1-2 year follow-up surveillance ultrasound of additional lesions as above. The above is in keeping with the ACR TI-RADS recommendations - J Am Coll Radiol 2017;14:587-595. Electronically Signed   By: Lucrezia Europe M.D.   On: 12/18/2016 16:27       Assessment & Plan:   Problem List Items Addressed This Visit    BMI  39.0-39.9,adult    Discussed diet and exercise.  Follow.        Essential hypertension, benign    Blood pressure under good control.  Continue same medication regimen.  Follow pressures.  Follow metabolic panel.        Relevant Orders   CBC with Differential/Platelet   Basic metabolic panel   Hypercholesterolemia - Primary    On crestor.  Low cholesterol diet and exercise.  Follow lipid panel and liver function tests.        Relevant Orders   Hepatic function panel   Lipid panel   Thyroid nodule    Saw Dr Eddie Dibbles.  Last checked 12/27/16.  Recommended f/u in 2 years.  Dr Gabriel Carina ordered US.  Scheduled for this am.  Check tsh.       Relevant Orders   TSH       Einar Pheasant, MD

## 2018-07-17 NOTE — Assessment & Plan Note (Signed)
Blood pressure under good control.  Continue same medication regimen.  Follow pressures.  Follow metabolic panel.   

## 2018-07-17 NOTE — Assessment & Plan Note (Signed)
Discussed diet and exercise.  Follow.  

## 2018-07-17 NOTE — Assessment & Plan Note (Signed)
Saw Dr Eddie Dibbles.  Last checked 12/27/16.  Recommended f/u in 2 years.  Dr Gabriel Carina ordered US.  Scheduled for this am.  Check tsh.

## 2019-01-20 ENCOUNTER — Encounter: Payer: Self-pay | Admitting: Internal Medicine

## 2019-01-20 ENCOUNTER — Ambulatory Visit (INDEPENDENT_AMBULATORY_CARE_PROVIDER_SITE_OTHER): Payer: BC Managed Care – PPO | Admitting: Internal Medicine

## 2019-01-20 ENCOUNTER — Other Ambulatory Visit: Payer: Self-pay

## 2019-01-20 VITALS — BP 132/80 | HR 65 | Temp 97.4°F | Ht 68.0 in | Wt 249.0 lb

## 2019-01-20 DIAGNOSIS — Z125 Encounter for screening for malignant neoplasm of prostate: Secondary | ICD-10-CM | POA: Diagnosis not present

## 2019-01-20 DIAGNOSIS — I10 Essential (primary) hypertension: Secondary | ICD-10-CM | POA: Diagnosis not present

## 2019-01-20 DIAGNOSIS — E041 Nontoxic single thyroid nodule: Secondary | ICD-10-CM

## 2019-01-20 DIAGNOSIS — E7800 Pure hypercholesterolemia, unspecified: Secondary | ICD-10-CM | POA: Diagnosis not present

## 2019-01-20 DIAGNOSIS — Z0000 Encounter for general adult medical examination without abnormal findings: Secondary | ICD-10-CM

## 2019-01-20 LAB — BASIC METABOLIC PANEL
BUN: 11 mg/dL (ref 6–23)
CO2: 32 mEq/L (ref 19–32)
Calcium: 10.6 mg/dL — ABNORMAL HIGH (ref 8.4–10.5)
Chloride: 104 mEq/L (ref 96–112)
Creatinine, Ser: 1.1 mg/dL (ref 0.40–1.50)
GFR: 69.6 mL/min (ref >60.00)
Glucose, Bld: 90 mg/dL (ref 70–99)
Potassium: 4.4 mEq/L (ref 3.5–5.1)
Sodium: 139 mEq/L (ref 135–145)

## 2019-01-20 LAB — LIPID PANEL
Cholesterol: 137 mg/dL (ref 0–200)
HDL: 37.8 mg/dL — ABNORMAL LOW (ref >39.00)
LDL Cholesterol: 75 mg/dL (ref 0–99)
NonHDL: 99.42
Total CHOL/HDL Ratio: 4
Triglycerides: 121 mg/dL (ref 0.0–149.0)
VLDL: 24.2 mg/dL (ref 0.0–40.0)

## 2019-01-20 LAB — HEPATIC FUNCTION PANEL
ALT: 30 U/L (ref 0–53)
AST: 18 U/L (ref 0–37)
Albumin: 3.9 g/dL (ref 3.5–5.2)
Alkaline Phosphatase: 84 U/L (ref 39–117)
Bilirubin, Direct: 0.1 mg/dL (ref 0.0–0.3)
Total Bilirubin: 0.6 mg/dL (ref 0.2–1.2)
Total Protein: 6.6 g/dL (ref 6.0–8.3)

## 2019-01-20 LAB — PSA: PSA: 0.4 ng/mL (ref 0.10–4.00)

## 2019-01-20 MED ORDER — ROSUVASTATIN CALCIUM 20 MG PO TABS: 20.0000 mg | ORAL_TABLET | Freq: Every day | ORAL | 3 refills | Status: DC

## 2019-01-20 MED ORDER — LISINOPRIL 20 MG PO TABS: ORAL_TABLET | ORAL | 3 refills | Status: DC

## 2019-01-20 NOTE — Progress Notes (Signed)
Patient ID: Samuel Last., male   DOB: 04-25-1966, 53 y.o.   MRN: [id number]   Subjective:    Patient ID: Samuel Last., male    DOB: [birth year], 53 y.o.   MRN: [id number]  HPI  Patient here for his physical exam.  He reports he is doing well.  Trying to stay active.  No chest pain.  No sob.  No acid reflux.  No abdominal pain.  Bowels moving.  Handling stress.  Blood pressure doing well.  Plans to get flu shot through pharmacy.  Saw dr Gabriel Carina - f/u thyroid.  Last evaluated 06/2018.  Multinodular goiter. Per his report, no further f/u warranted.      Past Medical History:  Diagnosis Date  . History of chicken pox   . Hypercholesterolemia   . Hypertension    Past Surgical History:  Procedure Laterality Date  . TUMOR REMOVAL  7/08 & 5/14   left shoulder, lipoma   Family History  Problem Relation Age of Onset  . Hyperlipidemia Mother   . Hypertension Mother   . Hyperlipidemia Father   . Kidney cancer Father   . Hyperlipidemia Paternal Aunt   . Colon cancer Paternal Uncle   . Prostate cancer Paternal Uncle   . Hyperlipidemia Paternal Uncle   . Hyperlipidemia Paternal Grandmother   . Hyperlipidemia Paternal Grandfather    Social History   Socioeconomic History  . Marital status: Married    Spouse name: Not on file  . Number of children: Not on file  . Years of education: Not on file  . Highest education level: Not on file  Occupational History  . Not on file  Social Needs  . Financial resource strain: Not on file  . Food insecurity    Worry: Not on file    Inability: Not on file  . Transportation needs    Medical: Not on file    Non-medical: Not on file  Tobacco Use  . Smoking status: Never Smoker  . Smokeless tobacco: Never Used  Substance and Sexual Activity  . Alcohol use: Yes    Alcohol/week: 0.0 standard drinks    Comment: rarley  . Drug use: No  . Sexual activity: Not on file  Lifestyle  . Physical activity    Days per week: Not on  file    Minutes per session: Not on file  . Stress: Not on file  Relationships  . Social Herbalist on phone: Not on file    Gets together: Not on file    Attends religious service: Not on file    Active member of club or organization: Not on file    Attends meetings of clubs or organizations: Not on file    Relationship status: Not on file  Other Topics Concern  . Not on file  Social History Narrative  . Not on file    Outpatient Encounter Medications as of 01/20/2019  Medication Sig  . aspirin 81 MG tablet Take 81 mg by mouth daily.  Marland Kitchen lisinopril (ZESTRIL) 20 MG tablet Take one tablet daily.  . rosuvastatin (CRESTOR) 20 MG tablet Take 1 tablet (20 mg total) by mouth daily.  . [DISCONTINUED] lisinopril (PRINIVIL,ZESTRIL) 20 MG tablet Take one tablet daily.  . [DISCONTINUED] rosuvastatin (CRESTOR) 20 MG tablet Take 1 tablet (20 mg total) by mouth daily.   No facility-administered encounter medications on file as of 01/20/2019.     Review of Systems  Constitutional: Negative for appetite change  and unexpected weight change.  HENT: Negative for congestion and sinus pressure.   Eyes: Negative for pain and visual disturbance.  Respiratory: Negative for cough, chest tightness and shortness of breath.   Cardiovascular: Negative for chest pain, palpitations and leg swelling.  Gastrointestinal: Negative for abdominal pain, diarrhea, nausea and vomiting.  Genitourinary: Negative for difficulty urinating and dysuria.  Musculoskeletal: Negative for joint swelling and myalgias.  Skin: Negative for color change and rash.  Neurological: Negative for dizziness, light-headedness and headaches.  Hematological: Negative for adenopathy. Does not bruise/bleed easily.  Psychiatric/Behavioral: Negative for agitation and dysphoric mood.       Objective:    Physical Exam Constitutional:      General: He is not in acute distress.    Appearance: Normal appearance. He is well-developed.   HENT:     Right Ear: External ear normal.     Left Ear: External ear normal.  Eyes:     General: No scleral icterus.       Right eye: No discharge.        Left eye: No discharge.     Conjunctiva/sclera: Conjunctivae normal.  Neck:     Musculoskeletal: Neck supple. No muscular tenderness.     Thyroid: No thyromegaly.  Cardiovascular:     Rate and Rhythm: Normal rate and regular rhythm.  Pulmonary:     Effort: No respiratory distress.     Breath sounds: Normal breath sounds. No wheezing.  Abdominal:     General: Bowel sounds are normal.     Palpations: Abdomen is soft.     Tenderness: There is no abdominal tenderness.  Genitourinary:    Comments: Not performed.  Musculoskeletal:        General: No swelling or tenderness.  Lymphadenopathy:     Cervical: No cervical adenopathy.  Skin:    Findings: No erythema or rash.  Neurological:     Mental Status: He is alert and oriented to person, place, and time.  Psychiatric:        Mood and Affect: Mood normal.        Behavior: Behavior normal.     BP 132/80   Pulse 65   Temp (!) 97.4 F (36.3 C)   Ht 5\' 8"  (1.727 m)   Wt 249 lb (112.9 kg)   SpO2 96%   BMI 37.86 kg/m  Wt Readings from Last 3 Encounters:  01/20/19 249 lb (112.9 kg)  07/17/18 253 lb 9.6 oz (115 kg)  01/16/18 249 lb 12.8 oz (113.3 kg)     Lab Results  Component Value Date   WBC 5.3 07/17/2018   HGB 15.9 07/17/2018   HCT 46.5 07/17/2018   PLT 256.0 07/17/2018   GLUCOSE 90 01/20/2019   CHOL 137 01/20/2019   TRIG 121.0 01/20/2019   HDL 37.80 (L) 01/20/2019   LDLDIRECT 166.3 05/08/2013   LDLCALC 75 01/20/2019   ALT 30 01/20/2019   AST 18 01/20/2019   NA 139 01/20/2019   K 4.4 01/20/2019   CL 104 01/20/2019   CREATININE 1.10 01/20/2019   BUN 11 01/20/2019   CO2 32 01/20/2019   TSH 0.86 07/17/2018   PSA 0.40 01/20/2019   HGBA1C 5.6 12/05/2016    US Thyroid  Result Date: 12/18/2016 CLINICAL DATA:  Nontoxic multinodular goiter EXAM: THYROID  ULTRASOUND TECHNIQUE: Ultrasound examination of the thyroid gland and adjacent soft tissues was performed. COMPARISON:  12/07/2015 and previous back to 04/27/2014 FINDINGS: Parenchymal Echotexture: Moderately heterogenous Isthmus: 0.9 cm thickness, previously 0.8 Right  lobe: 5.8 x 3.4 x 2.9 cm, previously 6 x 3.2 x 3.4 Left lobe: 5 x 3.2 x 3.2 cm, previously 5.9 x 3.1 x 3.3 _________________________________________________________ Estimated total number of nodules >/= 1 cm: 5 Number of spongiform nodules >/=  2 cm not described below (TR1): 0 Number of mixed cystic and solid nodules >/= 1.5 cm not described below ([place]): 0 _________________________________________________________ Nodule # 1: Prior biopsy: No Location: Right; Superior Maximum size: 1.7 cm; Other 2 dimensions: 1.5 x 1.1 cm, previously, 1.5 x 0.9 x 1.3 cm Composition: mixed cystic and solid (1) Echogenicity: hypoechoic (2) Shape: not taller-than-wide (0) Margins: ill-defined (0) Echogenic foci: none (0) ACR TI-RADS total points: 3. ACR TI-RADS risk category:  TR3 (3 points). Significant change in size (>/= 20% in two dimensions and minimal increase of 2 mm): No Change in features: No Change in ACR TI-RADS risk category: No ACR TI-RADS recommendations: *Given size (>/= 1.5 - 2.4 cm) and appearance, a follow-up ultrasound in 1 year should be considered based on TI-RADS criteria. _________________________________________________________ Nodule # 2: Prior biopsy: No Location: Right; Inferior Maximum size: 3.4 cm; Other 2 dimensions: 2.9 x 2.5 cm, previously, 3.2 x 3.1 x 2.7 cm Composition: solid/almost completely solid (2) Echogenicity: isoechoic (1) Shape: taller-than-wide (3) Margins: ill-defined (0) Echogenic foci: none (0) ACR TI-RADS total points: 6. ACR TI-RADS risk category:  TR4 (4-6 points). Significant change in size (>/= 20% in two dimensions and minimal increase of 2 mm): No Change in features: No Change in ACR TI-RADS risk category: No ACR  TI-RADS recommendations: **Given size (>/= 1.5 cm) and appearance, fine needle aspiration of this moderately suspicious nodule should be considered based on TI-RADS criteria. _________________________________________________________ Nodule # 3: Prior biopsy: No Location: Right; Mid Maximum size: 1.6 cm; Other 2 dimensions: 0.6 x 1.5 cm, previously, 1.7 x 0.6 x 1.5 cm Composition: solid/almost completely solid (2) Echogenicity: isoechoic (1) Shape: not taller-than-wide (0) Margins: ill-defined (0) Echogenic foci: none (0) ACR TI-RADS total points: 3. ACR TI-RADS risk category:  TR3 (3 points). Significant change in size (>/= 20% in two dimensions and minimal increase of 2 mm): No Change in features: No Change in ACR TI-RADS risk category: No ACR TI-RADS recommendations: *Given size (>/= 1.5 - 2.4 cm) and appearance, a follow-up ultrasound in 1 year should be considered based on TI-RADS criteria. _________________________________________________________ Nodule # 4: Prior biopsy: No Location: Left; Inferior Maximum size: 3.9 cm; Other 2 dimensions: 2.9 x 3.4 cm, previously, 3.8 x 2.9 x 3 cm Composition: solid/almost completely solid (2) Echogenicity: hypoechoic (2) Shape: taller-than-wide (3) Margins: ill-defined (0) Echogenic foci: none (0) ACR TI-RADS total points: 7. ACR TI-RADS risk category:  TR5 (>/= 7 points). Significant change in size (>/= 20% in two dimensions and minimal increase of 2 mm): No Change in features: No Change in ACR TI-RADS risk category: No ACR TI-RADS recommendations: **Given size (>/= 1.0 cm) and appearance, fine needle aspiration of this highly suspicious nodule should be considered based on TI-RADS criteria. _________________________________________________________ Nodule # 5: Prior biopsy: No Location: Isthmus; left of midline Maximum size: 1.2 cm; Other 2 dimensions: 0.6 x 1 cm, previously, 1 x 0.9 x 0.7 cm Composition: mixed cystic and solid (1) Echogenicity: hypoechoic (2) Shape: not  taller-than-wide (0) Margins: ill-defined (0) Echogenic foci: none (0) ACR TI-RADS total points: 3. ACR TI-RADS risk category:  TR3 (3 points). Significant change in size (>/= 20% in two dimensions and minimal increase of 2 mm): No Change in features: No Change in  ACR TI-RADS risk category: No ACR TI-RADS recommendations: Given size (<1.4 cm) and appearance, this nodule does NOT meet TI-RADS criteria for biopsy or dedicated follow-up. _________________________________________________________ IMPRESSION: 1. Stable thyromegaly with multiple nodules. 2. Recommend FNA biopsy of highly suspicious 3.9 cm inferior left and moderately suspicious 3.4 cm inferior right nodules. 3. Recommend 1-2 year follow-up surveillance ultrasound of additional lesions as above. The above is in keeping with the ACR TI-RADS recommendations - J Am Coll Radiol 2017;14:587-595. Electronically Signed   By: Lucrezia Europe M.D.   On: 12/18/2016 16:27       Assessment & Plan:   Problem List Items Addressed This Visit    Essential hypertension, benign    Blood pressure under good control.  Continue same medication regimen.  Follow pressures.  Follow metabolic panel.        Relevant Medications   lisinopril (ZESTRIL) 20 MG tablet   rosuvastatin (CRESTOR) 20 MG tablet   Other Relevant Orders   Basic metabolic panel (Completed)   Health care maintenance    Physical today 01/20/19.  Colonoscopy 03/2015 - diverticulosis.  Check psa today.        Hypercholesterolemia    On crestor.  Low cholesterol diet and exercise.  Follow lipid panel and liver function tests.        Relevant Medications   lisinopril (ZESTRIL) 20 MG tablet   rosuvastatin (CRESTOR) 20 MG tablet   Other Relevant Orders   Lipid panel (Completed)   Hepatic function panel (Completed)   Thyroid nodule    Saw Dr Gabriel Carina 06/2018.  Had f/u ultrasound - multinodular goiter.   Per pt no further f/u warranted.         Other Visit Diagnoses    Prostate cancer screening        Relevant Orders   PSA (Completed)       Einar Pheasant, MD

## 2019-01-20 NOTE — Assessment & Plan Note (Addendum)
Physical today 01/20/19.  Colonoscopy 03/2015 - diverticulosis.  Check psa today.

## 2019-01-21 ENCOUNTER — Encounter: Payer: Self-pay | Admitting: *Deleted

## 2019-01-21 ENCOUNTER — Other Ambulatory Visit: Payer: Self-pay | Admitting: Internal Medicine

## 2019-01-21 ENCOUNTER — Telehealth: Payer: Self-pay | Admitting: Internal Medicine

## 2019-01-21 NOTE — Telephone Encounter (Signed)
Opened in error

## 2019-01-21 NOTE — Progress Notes (Signed)
Order placed for f/u lab.   

## 2019-01-25 ENCOUNTER — Encounter: Payer: Self-pay | Admitting: Internal Medicine

## 2019-01-25 NOTE — Assessment & Plan Note (Signed)
On crestor.  Low cholesterol diet and exercise.  Follow lipid panel and liver function tests.   

## 2019-01-25 NOTE — Assessment & Plan Note (Signed)
Blood pressure under good control.  Continue same medication regimen.  Follow pressures.  Follow metabolic panel.   

## 2019-01-25 NOTE — Assessment & Plan Note (Signed)
Saw Dr Gabriel Carina 06/2018.  Had f/u ultrasound - multinodular goiter.   Per pt no further f/u warranted.

## 2019-02-10 ENCOUNTER — Other Ambulatory Visit (INDEPENDENT_AMBULATORY_CARE_PROVIDER_SITE_OTHER): Payer: BC Managed Care – PPO

## 2019-02-10 ENCOUNTER — Encounter: Payer: Self-pay | Admitting: Internal Medicine

## 2019-02-10 ENCOUNTER — Other Ambulatory Visit: Payer: Self-pay

## 2019-02-10 LAB — CALCIUM: Calcium: 10.4 mg/dL (ref 8.4–10.5)

## 2019-07-21 ENCOUNTER — Ambulatory Visit (INDEPENDENT_AMBULATORY_CARE_PROVIDER_SITE_OTHER): Payer: BC Managed Care – PPO | Admitting: Internal Medicine

## 2019-07-21 ENCOUNTER — Other Ambulatory Visit: Payer: Self-pay

## 2019-07-21 ENCOUNTER — Encounter: Payer: Self-pay | Admitting: Internal Medicine

## 2019-07-21 DIAGNOSIS — E041 Nontoxic single thyroid nodule: Secondary | ICD-10-CM

## 2019-07-21 DIAGNOSIS — I10 Essential (primary) hypertension: Secondary | ICD-10-CM

## 2019-07-21 DIAGNOSIS — E7800 Pure hypercholesterolemia, unspecified: Secondary | ICD-10-CM | POA: Diagnosis not present

## 2019-07-21 LAB — HEPATIC FUNCTION PANEL
ALT: 28 U/L (ref 0–53)
AST: 18 U/L (ref 0–37)
Albumin: 3.9 g/dL (ref 3.5–5.2)
Alkaline Phosphatase: 85 U/L (ref 39–117)
Bilirubin, Direct: 0.1 mg/dL (ref 0.0–0.3)
Total Bilirubin: 0.6 mg/dL (ref 0.2–1.2)
Total Protein: 6.5 g/dL (ref 6.0–8.3)

## 2019-07-21 LAB — CBC WITH DIFFERENTIAL/PLATELET
Basophils Absolute: 0.1 10*3/uL (ref 0.0–0.1)
Basophils Relative: 1.1 % (ref 0.0–3.0)
Eosinophils Absolute: 0.2 10*3/uL (ref 0.0–0.7)
Eosinophils Relative: 3.5 % (ref 0.0–5.0)
HCT: 45.4 % (ref 39.0–52.0)
Hemoglobin: 15.4 g/dL (ref 13.0–17.0)
Lymphocytes Relative: 32.2 % (ref 12.0–46.0)
Lymphs Abs: 1.7 10*3/uL (ref 0.7–4.0)
MCHC: 33.8 g/dL (ref 30.0–36.0)
MCV: 86.3 fl (ref 78.0–100.0)
Monocytes Absolute: 0.5 10*3/uL (ref 0.1–1.0)
Monocytes Relative: 9.7 % (ref 3.0–12.0)
Neutro Abs: 2.8 10*3/uL (ref 1.4–7.7)
Neutrophils Relative %: 53.5 % (ref 43.0–77.0)
Platelets: 259 10*3/uL (ref 150.0–400.0)
RBC: 5.27 Mil/uL (ref 4.22–5.81)
RDW: 13.5 % (ref 11.5–15.5)
WBC: 5.2 10*3/uL (ref 4.0–10.5)

## 2019-07-21 LAB — BASIC METABOLIC PANEL
BUN: 13 mg/dL (ref 6–23)
CO2: 31 mEq/L (ref 19–32)
Calcium: 10 mg/dL (ref 8.4–10.5)
Chloride: 105 mEq/L (ref 96–112)
Creatinine, Ser: 1.1 mg/dL (ref 0.40–1.50)
GFR: 69.47 mL/min (ref >60.00)
Glucose, Bld: 93 mg/dL (ref 70–99)
Potassium: 4.4 mEq/L (ref 3.5–5.1)
Sodium: 139 mEq/L (ref 135–145)

## 2019-07-21 LAB — T4, FREE: Free T4: 0.85 ng/dL (ref 0.60–1.60)

## 2019-07-21 LAB — LIPID PANEL
Cholesterol: 144 mg/dL (ref 0–200)
HDL: 42.6 mg/dL (ref >39.00)
LDL Cholesterol: 83 mg/dL (ref 0–99)
NonHDL: 100.9
Total CHOL/HDL Ratio: 3
Triglycerides: 88 mg/dL (ref 0.0–149.0)
VLDL: 17.6 mg/dL (ref 0.0–40.0)

## 2019-07-21 LAB — TSH: TSH: 1.19 u[IU]/mL (ref 0.35–4.50)

## 2019-07-21 NOTE — Assessment & Plan Note (Signed)
Low cholesterol diet and exercise.  On crestor.  Check lipid panel and liver function tests today.

## 2019-07-21 NOTE — Progress Notes (Signed)
Patient ID: Samuel Last., male   DOB: April 16, 1966, 54 y.o.   MRN: [id number]   Subjective:    Patient ID: Samuel Last., male    DOB: [birth year], 54 y.o.   MRN: [id number]  HPI This visit occurred during the SARS-CoV-2 public health emergency.  Safety protocols were in place, including screening questions prior to the visit, additional usage of staff PPE, and extensive cleaning of exam room while observing appropriate contact time as indicated for disinfecting solutions.  Patient here for a scheduled follow up.  He reports he is doing well.  Due to get second covid vaccine tomorrow.  Tries to stay active.  No chest pain.  No sob reported.  No abdominal pain or bowel change reported.  States blood pressures averaging 120s/70-80.  Overall feels good.  Handling stress.     Past Medical History:  Diagnosis Date  . History of chicken pox   . Hypercholesterolemia   . Hypertension    Past Surgical History:  Procedure Laterality Date  . TUMOR REMOVAL  7/08 & 5/14   left shoulder, lipoma   Family History  Problem Relation Age of Onset  . Hyperlipidemia Mother   . Hypertension Mother   . Hyperlipidemia Father   . Kidney cancer Father   . Hyperlipidemia Paternal Aunt   . Colon cancer Paternal Uncle   . Prostate cancer Paternal Uncle   . Hyperlipidemia Paternal Uncle   . Hyperlipidemia Paternal Grandmother   . Hyperlipidemia Paternal Grandfather    Social History   Socioeconomic History  . Marital status: Married    Spouse name: Not on file  . Number of children: Not on file  . Years of education: Not on file  . Highest education level: Not on file  Occupational History  . Not on file  Tobacco Use  . Smoking status: Never Smoker  . Smokeless tobacco: Never Used  Substance and Sexual Activity  . Alcohol use: Yes    Alcohol/week: 0.0 standard drinks    Comment: rarley  . Drug use: No  . Sexual activity: Not on file  Other Topics Concern  . Not on file    Social History Narrative  . Not on file   Social Determinants of Health   Financial Resource Strain:   . Difficulty of Paying Living Expenses:   Food Insecurity:   . Worried About Charity fundraiser in the Last Year:   . Arboriculturist in the Last Year:   Transportation Needs:   . Film/video editor (Medical):   Marland Kitchen Lack of Transportation (Non-Medical):   Physical Activity:   . Days of Exercise per Week:   . Minutes of Exercise per Session:   Stress:   . Feeling of Stress :   Social Connections:   . Frequency of Communication with Friends and Family:   . Frequency of Social Gatherings with Friends and Family:   . Attends Religious Services:   . Active Member of Clubs or Organizations:   . Attends Archivist Meetings:   Marland Kitchen Marital Status:     Outpatient Encounter Medications as of 07/21/2019  Medication Sig  . aspirin 81 MG tablet Take 81 mg by mouth daily.  Marland Kitchen lisinopril (ZESTRIL) 20 MG tablet Take one tablet daily.  . rosuvastatin (CRESTOR) 20 MG tablet Take 1 tablet (20 mg total) by mouth daily.   No facility-administered encounter medications on file as of 07/21/2019.   Review of Systems  Constitutional: Negative  for appetite change and unexpected weight change.  HENT: Negative for congestion and sinus pressure.   Respiratory: Negative for cough, chest tightness and shortness of breath.   Cardiovascular: Negative for chest pain, palpitations and leg swelling.  Gastrointestinal: Negative for abdominal pain, diarrhea, nausea and vomiting.  Genitourinary: Negative for difficulty urinating and dysuria.  Musculoskeletal: Negative for joint swelling and myalgias.  Skin: Negative for color change and rash.  Neurological: Negative for dizziness, light-headedness and headaches.  Psychiatric/Behavioral: Negative for agitation and dysphoric mood.       Objective:    Physical Exam Constitutional:      General: He is not in acute distress.    Appearance: Normal  appearance. He is well-developed.  HENT:     Head: Normocephalic and atraumatic.     Right Ear: External ear normal.     Left Ear: External ear normal.  Eyes:     General: No scleral icterus.       Right eye: No discharge.        Left eye: No discharge.     Conjunctiva/sclera: Conjunctivae normal.  Cardiovascular:     Rate and Rhythm: Normal rate and regular rhythm.  Pulmonary:     Effort: Pulmonary effort is normal. No respiratory distress.     Breath sounds: Normal breath sounds.  Abdominal:     General: Bowel sounds are normal.     Palpations: Abdomen is soft.     Tenderness: There is no abdominal tenderness.  Musculoskeletal:        General: No swelling or tenderness.     Cervical back: Neck supple. No tenderness.  Lymphadenopathy:     Cervical: No cervical adenopathy.  Skin:    Findings: No erythema or rash.  Neurological:     Mental Status: He is alert.  Psychiatric:        Mood and Affect: Mood normal.        Behavior: Behavior normal.    Blood pressure rechecked by me:  128/78  BP 122/78   Pulse 67   Temp 97.8 F (36.6 C)   Resp 16   Ht 5\' 8"  (1.727 m)   Wt 248 lb (112.5 kg)   SpO2 97%   BMI 37.71 kg/m  Wt Readings from Last 3 Encounters:  07/21/19 248 lb (112.5 kg)  01/20/19 249 lb (112.9 kg)  07/17/18 253 lb 9.6 oz (115 kg)     Lab Results  Component Value Date   WBC 5.3 07/17/2018   HGB 15.9 07/17/2018   HCT 46.5 07/17/2018   PLT 256.0 07/17/2018   GLUCOSE 90 01/20/2019   CHOL 137 01/20/2019   TRIG 121.0 01/20/2019   HDL 37.80 (L) 01/20/2019   LDLDIRECT 166.3 05/08/2013   LDLCALC 75 01/20/2019   ALT 30 01/20/2019   AST 18 01/20/2019   NA 139 01/20/2019   K 4.4 01/20/2019   CL 104 01/20/2019   CREATININE 1.10 01/20/2019   BUN 11 01/20/2019   CO2 32 01/20/2019   TSH 0.86 07/17/2018   PSA 0.40 01/20/2019   HGBA1C 5.6 12/05/2016       Assessment & Plan:   Problem List Items Addressed This Visit    Essential hypertension, benign      Blood pressure under good control.  Continue same medication regimen - lisinopril.   Follow pressures.  Follow metabolic panel.        Relevant Orders   CBC with Differential/Platelet   Basic metabolic panel   Hypercholesterolemia  Low cholesterol diet and exercise.  On crestor.  Check lipid panel and liver function tests today.       Relevant Orders   Hepatic function panel   Lipid panel   Thyroid nodule    Saw Dr Gabriel Carina 06/2018.  Had f/u ultrasound - multinodular goiter.  Per pt, no further w/up warranted.        Relevant Orders   TSH   T4, free       Einar Pheasant, MD

## 2019-07-21 NOTE — Assessment & Plan Note (Signed)
Blood pressure under good control.  Continue same medication regimen - lisinopril.   Follow pressures.  Follow metabolic panel.

## 2019-07-21 NOTE — Assessment & Plan Note (Signed)
Saw Dr Gabriel Carina 06/2018.  Had f/u ultrasound - multinodular goiter.  Per pt, no further w/up warranted.

## 2019-07-22 ENCOUNTER — Encounter: Payer: Self-pay | Admitting: Internal Medicine

## 2020-01-22 ENCOUNTER — Encounter: Payer: BC Managed Care – PPO | Admitting: Internal Medicine

## 2020-01-25 ENCOUNTER — Other Ambulatory Visit: Payer: Self-pay | Admitting: Internal Medicine

## 2020-03-18 ENCOUNTER — Ambulatory Visit (INDEPENDENT_AMBULATORY_CARE_PROVIDER_SITE_OTHER): Payer: BC Managed Care – PPO | Admitting: Internal Medicine

## 2020-03-18 ENCOUNTER — Other Ambulatory Visit: Payer: Self-pay

## 2020-03-18 VITALS — BP 138/72 | HR 76 | Temp 97.8°F | Resp 16 | Ht 68.0 in | Wt 241.0 lb

## 2020-03-18 DIAGNOSIS — E041 Nontoxic single thyroid nodule: Secondary | ICD-10-CM

## 2020-03-18 DIAGNOSIS — I10 Essential (primary) hypertension: Secondary | ICD-10-CM

## 2020-03-18 DIAGNOSIS — Z0000 Encounter for general adult medical examination without abnormal findings: Secondary | ICD-10-CM | POA: Diagnosis not present

## 2020-03-18 DIAGNOSIS — Z125 Encounter for screening for malignant neoplasm of prostate: Secondary | ICD-10-CM

## 2020-03-18 DIAGNOSIS — E7800 Pure hypercholesterolemia, unspecified: Secondary | ICD-10-CM

## 2020-03-18 LAB — LIPID PANEL
Cholesterol: 146 mg/dL (ref 0–200)
HDL: 42.3 mg/dL (ref >39.00)
LDL Cholesterol: 86 mg/dL (ref 0–99)
NonHDL: 103.3
Total CHOL/HDL Ratio: 3
Triglycerides: 87 mg/dL (ref 0.0–149.0)
VLDL: 17.4 mg/dL (ref 0.0–40.0)

## 2020-03-18 LAB — TSH: TSH: 1.03 u[IU]/mL (ref 0.35–4.50)

## 2020-03-18 LAB — HEPATIC FUNCTION PANEL
ALT: 23 U/L (ref 0–53)
AST: 17 U/L (ref 0–37)
Albumin: 4.1 g/dL (ref 3.5–5.2)
Alkaline Phosphatase: 89 U/L (ref 39–117)
Bilirubin, Direct: 0.1 mg/dL (ref 0.0–0.3)
Total Bilirubin: 0.5 mg/dL (ref 0.2–1.2)
Total Protein: 6.8 g/dL (ref 6.0–8.3)

## 2020-03-18 LAB — BASIC METABOLIC PANEL
BUN: 11 mg/dL (ref 6–23)
CO2: 31 mEq/L (ref 19–32)
Calcium: 10.2 mg/dL (ref 8.4–10.5)
Chloride: 104 mEq/L (ref 96–112)
Creatinine, Ser: 1.02 mg/dL (ref 0.40–1.50)
GFR: 82.58 mL/min (ref >60.00)
Glucose, Bld: 89 mg/dL (ref 70–99)
Potassium: 4.5 mEq/L (ref 3.5–5.1)
Sodium: 140 mEq/L (ref 135–145)

## 2020-03-18 LAB — PSA: PSA: 0.37 ng/mL (ref 0.10–4.00)

## 2020-03-18 LAB — T4, FREE: Free T4: 0.82 ng/dL (ref 0.60–1.60)

## 2020-03-18 NOTE — Progress Notes (Signed)
Patient ID: [name], male   DOB: 03-May-1965, 54 y.o.   MRN: [id number]   Subjective:    Patient ID: [name], male    DOB: 1965/05/24, 54 y.o.   MRN: [id number]  HPI This visit occurred during the SARS-CoV-2 public health emergency.  Safety protocols were in place, including screening questions prior to the visit, additional usage of staff PPE, and extensive cleaning of exam room while observing appropriate contact time as indicated for disinfecting solutions.  Patient here for his physical exam. He reports he is doing well.  Here to follow up regarding his blood pressure and cholesterol.  Trying to stay active.  Discussed diet and exercise.  Weight is down.  No chest pain or sob reported.  No abdominal pain or bowel change reported.     Past Medical History:  Diagnosis Date  . History of chicken pox   . Hypercholesterolemia   . Hypertension    Past Surgical History:  Procedure Laterality Date  . TUMOR REMOVAL  7/08 & 5/14   left shoulder, lipoma   Family History  Problem Relation Age of Onset  . Hyperlipidemia Mother   . Hypertension Mother   . Hyperlipidemia Father   . Kidney cancer Father   . Hyperlipidemia Paternal Aunt   . Colon cancer Paternal Uncle   . Prostate cancer Paternal Uncle   . Hyperlipidemia Paternal Uncle   . Hyperlipidemia Paternal Grandmother   . Hyperlipidemia Paternal Grandfather    Social History   Socioeconomic History  . Marital status: Married    Spouse name: Not on file  . Number of children: Not on file  . Years of education: Not on file  . Highest education level: Not on file  Occupational History  . Not on file  Tobacco Use  . Smoking status: Never Smoker  . Smokeless tobacco: Never Used  Substance and Sexual Activity  . Alcohol use: Yes    Alcohol/week: 0.0 standard drinks    Comment: rarley  . Drug use: No  . Sexual activity: Not on file  Other Topics Concern  . Not on file  Social History Narrative   . Not on file   Social Determinants of Health   Financial Resource Strain:   . Difficulty of Paying Living Expenses: Not on file  Food Insecurity:   . Worried About Charity fundraiser in the Last Year: Not on file  . Ran Out of Food in the Last Year: Not on file  Transportation Needs:   . Lack of Transportation (Medical): Not on file  . Lack of Transportation (Non-Medical): Not on file  Physical Activity:   . Days of Exercise per Week: Not on file  . Minutes of Exercise per Session: Not on file  Stress:   . Feeling of Stress : Not on file  Social Connections:   . Frequency of Communication with Friends and Family: Not on file  . Frequency of Social Gatherings with Friends and Family: Not on file  . Attends Religious Services: Not on file  . Active Member of Clubs or Organizations: Not on file  . Attends Archivist Meetings: Not on file  . Marital Status: Not on file    Outpatient Encounter Medications as of 03/18/2020  Medication Sig  . aspirin 81 MG tablet Take 81 mg by mouth daily.  Marland Kitchen lisinopril (ZESTRIL) 20 MG tablet Take one tablet daily.  . rosuvastatin (CRESTOR) 20 MG tablet Take 1 tablet (20 mg  total) by mouth daily.   No facility-administered encounter medications on file as of 03/18/2020.    Review of Systems  Constitutional: Negative for appetite change and unexpected weight change.  HENT: Negative for congestion, sinus pressure and sore throat.   Eyes: Negative for pain and visual disturbance.  Respiratory: Negative for cough, chest tightness and shortness of breath.   Cardiovascular: Negative for chest pain, palpitations and leg swelling.  Gastrointestinal: Negative for abdominal pain, diarrhea, nausea and vomiting.  Genitourinary: Negative for difficulty urinating and dysuria.  Musculoskeletal: Negative for joint swelling and myalgias.  Skin: Negative for color change and rash.  Neurological: Negative for dizziness, light-headedness and  headaches.  Hematological: Negative for adenopathy. Does not bruise/bleed easily.  Psychiatric/Behavioral: Negative for agitation and dysphoric mood.       Objective:    Physical Exam Vitals reviewed.  Constitutional:      General: He is not in acute distress.    Appearance: Normal appearance. He is well-developed.  HENT:     Head: Normocephalic and atraumatic.     Right Ear: External ear normal.     Left Ear: External ear normal.  Eyes:     General: No scleral icterus.       Right eye: No discharge.        Left eye: No discharge.     Conjunctiva/sclera: Conjunctivae normal.  Neck:     Thyroid: No thyromegaly.  Cardiovascular:     Rate and Rhythm: Normal rate and regular rhythm.  Pulmonary:     Effort: No respiratory distress.     Breath sounds: Normal breath sounds. No wheezing.  Abdominal:     General: Bowel sounds are normal.     Palpations: Abdomen is soft.     Tenderness: There is no abdominal tenderness.  Genitourinary:    Comments: Rectal exam:  No palpable prostate nodule. Heme negative.  Musculoskeletal:        General: No swelling or tenderness.     Cervical back: Neck supple. No tenderness.  Lymphadenopathy:     Cervical: No cervical adenopathy.  Skin:    Findings: No erythema or rash.  Neurological:     Mental Status: He is alert and oriented to person, place, and time.  Psychiatric:        Mood and Affect: Mood normal.        Behavior: Behavior normal.     BP 138/72   Pulse 76   Temp 97.8 F (36.6 C) (Oral)   Resp 16   Ht 5\' 8"  (1.727 m)   Wt 241 lb (109.3 kg)   SpO2 98%   BMI 36.64 kg/m  Wt Readings from Last 3 Encounters:  03/18/20 241 lb (109.3 kg)  07/21/19 248 lb (112.5 kg)  01/20/19 249 lb (112.9 kg)     Lab Results  Component Value Date   WBC 5.2 07/21/2019   HGB 15.4 07/21/2019   HCT 45.4 07/21/2019   PLT 259.0 07/21/2019   GLUCOSE 89 03/18/2020   CHOL 146 03/18/2020   TRIG 87.0 03/18/2020   HDL 42.30 03/18/2020    LDLDIRECT 166.3 05/08/2013   LDLCALC 86 03/18/2020   ALT 23 03/18/2020   AST 17 03/18/2020   NA 140 03/18/2020   K 4.5 03/18/2020   CL 104 03/18/2020   CREATININE 1.02 03/18/2020   BUN 11 03/18/2020   CO2 31 03/18/2020   TSH 1.03 03/18/2020   PSA 0.37 03/18/2020   HGBA1C 5.6 12/05/2016    US THYROID  Result Date: 12/18/2016 CLINICAL DATA:  Nontoxic multinodular goiter EXAM: THYROID ULTRASOUND TECHNIQUE: Ultrasound examination of the thyroid gland and adjacent soft tissues was performed. COMPARISON:  12/07/2015 and previous back to 04/27/2014 FINDINGS: Parenchymal Echotexture: Moderately heterogenous Isthmus: 0.9 cm thickness, previously 0.8 Right lobe: 5.8 x 3.4 x 2.9 cm, previously 6 x 3.2 x 3.4 Left lobe: 5 x 3.2 x 3.2 cm, previously 5.9 x 3.1 x 3.3 _________________________________________________________ Estimated total number of nodules >/= 1 cm: 5 Number of spongiform nodules >/=  2 cm not described below (TR1): 0 Number of mixed cystic and solid nodules >/= 1.5 cm not described below ([place]): 0 _________________________________________________________ Nodule # 1: Prior biopsy: No Location: Right; Superior Maximum size: 1.7 cm; Other 2 dimensions: 1.5 x 1.1 cm, previously, 1.5 x 0.9 x 1.3 cm Composition: mixed cystic and solid (1) Echogenicity: hypoechoic (2) Shape: not taller-than-wide (0) Margins: ill-defined (0) Echogenic foci: none (0) ACR TI-RADS total points: 3. ACR TI-RADS risk category:  TR3 (3 points). Significant change in size (>/= 20% in two dimensions and minimal increase of 2 mm): No Change in features: No Change in ACR TI-RADS risk category: No ACR TI-RADS recommendations: *Given size (>/= 1.5 - 2.4 cm) and appearance, a follow-up ultrasound in 1 year should be considered based on TI-RADS criteria. _________________________________________________________ Nodule # 2: Prior biopsy: No Location: Right; Inferior Maximum size: 3.4 cm; Other 2 dimensions: 2.9 x 2.5 cm, previously, 3.2  x 3.1 x 2.7 cm Composition: solid/almost completely solid (2) Echogenicity: isoechoic (1) Shape: taller-than-wide (3) Margins: ill-defined (0) Echogenic foci: none (0) ACR TI-RADS total points: 6. ACR TI-RADS risk category:  TR4 (4-6 points). Significant change in size (>/= 20% in two dimensions and minimal increase of 2 mm): No Change in features: No Change in ACR TI-RADS risk category: No ACR TI-RADS recommendations: **Given size (>/= 1.5 cm) and appearance, fine needle aspiration of this moderately suspicious nodule should be considered based on TI-RADS criteria. _________________________________________________________ Nodule # 3: Prior biopsy: No Location: Right; Mid Maximum size: 1.6 cm; Other 2 dimensions: 0.6 x 1.5 cm, previously, 1.7 x 0.6 x 1.5 cm Composition: solid/almost completely solid (2) Echogenicity: isoechoic (1) Shape: not taller-than-wide (0) Margins: ill-defined (0) Echogenic foci: none (0) ACR TI-RADS total points: 3. ACR TI-RADS risk category:  TR3 (3 points). Significant change in size (>/= 20% in two dimensions and minimal increase of 2 mm): No Change in features: No Change in ACR TI-RADS risk category: No ACR TI-RADS recommendations: *Given size (>/= 1.5 - 2.4 cm) and appearance, a follow-up ultrasound in 1 year should be considered based on TI-RADS criteria. _________________________________________________________ Nodule # 4: Prior biopsy: No Location: Left; Inferior Maximum size: 3.9 cm; Other 2 dimensions: 2.9 x 3.4 cm, previously, 3.8 x 2.9 x 3 cm Composition: solid/almost completely solid (2) Echogenicity: hypoechoic (2) Shape: taller-than-wide (3) Margins: ill-defined (0) Echogenic foci: none (0) ACR TI-RADS total points: 7. ACR TI-RADS risk category:  TR5 (>/= 7 points). Significant change in size (>/= 20% in two dimensions and minimal increase of 2 mm): No Change in features: No Change in ACR TI-RADS risk category: No ACR TI-RADS recommendations: **Given size (>/= 1.0 cm) and  appearance, fine needle aspiration of this highly suspicious nodule should be considered based on TI-RADS criteria. _________________________________________________________ Nodule # 5: Prior biopsy: No Location: Isthmus; left of midline Maximum size: 1.2 cm; Other 2 dimensions: 0.6 x 1 cm, previously, 1 x 0.9 x 0.7 cm Composition: mixed cystic and solid (1) Echogenicity: hypoechoic (2) Shape:  not taller-than-wide (0) Margins: ill-defined (0) Echogenic foci: none (0) ACR TI-RADS total points: 3. ACR TI-RADS risk category:  TR3 (3 points). Significant change in size (>/= 20% in two dimensions and minimal increase of 2 mm): No Change in features: No Change in ACR TI-RADS risk category: No ACR TI-RADS recommendations: Given size (<1.4 cm) and appearance, this nodule does NOT meet TI-RADS criteria for biopsy or dedicated follow-up. _________________________________________________________ IMPRESSION: 1. Stable thyromegaly with multiple nodules. 2. Recommend FNA biopsy of highly suspicious 3.9 cm inferior left and moderately suspicious 3.4 cm inferior right nodules. 3. Recommend 1-2 year follow-up surveillance ultrasound of additional lesions as above. The above is in keeping with the ACR TI-RADS recommendations - J Am Coll Radiol 2017;14:587-595. Electronically Signed   By: Lucrezia Europe M.D.   On: 12/18/2016 16:27       Assessment & Plan:   Problem List Items Addressed This Visit    Thyroid nodule    Saw Dr Gabriel Carina.  Ultrasound revealed multinodular goiter.  Per pt, no further w/up warranted.  Check TSH and free T4.        Relevant Orders   TSH (Completed)   T4, free (Completed)   Hypercholesterolemia    On crestor.  Low cholesterol diet and exercise.  Follow lipid panel and liver function tests.        Relevant Orders   Lipid panel (Completed)   Hepatic function panel (Completed)   Health care maintenance    Physical today 03/18/20.  Colonoscopy 03/2015 - diverticulosis.  Check psa today.        Essential hypertension, benign    On lisinopril.  Blood pressure doing well.  Continue lisinopril.  Follow pressures.  Follow metabolic panel.       Relevant Orders   Basic metabolic panel (Completed)    Other Visit Diagnoses    Routine general medical examination at a health care facility    -  Primary   Prostate cancer screening       Relevant Orders   PSA (Completed)       Einar Pheasant, MD

## 2020-03-18 NOTE — Assessment & Plan Note (Addendum)
Physical today 03/18/20.  Colonoscopy 03/2015 - diverticulosis.  Check psa today.

## 2020-03-19 ENCOUNTER — Encounter: Payer: Self-pay | Admitting: Internal Medicine

## 2020-03-19 NOTE — Assessment & Plan Note (Signed)
On lisinopril.  Blood pressure doing well.  Continue lisinopril.  Follow pressures.  Follow metabolic panel.

## 2020-03-19 NOTE — Assessment & Plan Note (Signed)
On crestor.  Low cholesterol diet and exercise.  Follow lipid panel and liver function tests.   

## 2020-03-19 NOTE — Assessment & Plan Note (Signed)
Saw Dr Gabriel Carina.  Ultrasound revealed multinodular goiter.  Per pt, no further w/up warranted.  Check TSH and free T4.

## 2020-04-11 ENCOUNTER — Other Ambulatory Visit: Payer: Self-pay | Admitting: Internal Medicine

## 2020-07-29 ENCOUNTER — Other Ambulatory Visit: Payer: Self-pay | Admitting: Internal Medicine

## 2020-09-16 ENCOUNTER — Other Ambulatory Visit: Payer: Self-pay

## 2020-09-16 ENCOUNTER — Ambulatory Visit (INDEPENDENT_AMBULATORY_CARE_PROVIDER_SITE_OTHER): Payer: BC Managed Care – PPO | Admitting: Internal Medicine

## 2020-09-16 VITALS — BP 126/76 | HR 64 | Temp 97.8°F | Resp 16 | Ht 68.0 in | Wt 232.0 lb

## 2020-09-16 DIAGNOSIS — E041 Nontoxic single thyroid nodule: Secondary | ICD-10-CM | POA: Diagnosis not present

## 2020-09-16 DIAGNOSIS — Z114 Encounter for screening for human immunodeficiency virus [HIV]: Secondary | ICD-10-CM

## 2020-09-16 DIAGNOSIS — E7800 Pure hypercholesterolemia, unspecified: Secondary | ICD-10-CM

## 2020-09-16 DIAGNOSIS — I10 Essential (primary) hypertension: Secondary | ICD-10-CM

## 2020-09-16 DIAGNOSIS — Z1159 Encounter for screening for other viral diseases: Secondary | ICD-10-CM | POA: Diagnosis not present

## 2020-09-16 DIAGNOSIS — M71321 Other bursal cyst, right elbow: Secondary | ICD-10-CM

## 2020-09-16 LAB — LIPID PANEL
Cholesterol: 145 mg/dL (ref 0–200)
HDL: 40.1 mg/dL (ref >39.00)
LDL Cholesterol: 85 mg/dL (ref 0–99)
NonHDL: 105.22
Total CHOL/HDL Ratio: 4
Triglycerides: 100 mg/dL (ref 0.0–149.0)
VLDL: 20 mg/dL (ref 0.0–40.0)

## 2020-09-16 LAB — BASIC METABOLIC PANEL
BUN: 11 mg/dL (ref 6–23)
CO2: 29 mEq/L (ref 19–32)
Calcium: 9.9 mg/dL (ref 8.4–10.5)
Chloride: 106 mEq/L (ref 96–112)
Creatinine, Ser: 1.12 mg/dL (ref 0.40–1.50)
GFR: 73.56 mL/min (ref >60.00)
Glucose, Bld: 98 mg/dL (ref 70–99)
Potassium: 4.3 mEq/L (ref 3.5–5.1)
Sodium: 141 mEq/L (ref 135–145)

## 2020-09-16 LAB — CBC WITH DIFFERENTIAL/PLATELET
Basophils Absolute: 0 10*3/uL (ref 0.0–0.1)
Basophils Relative: 0.9 % (ref 0.0–3.0)
Eosinophils Absolute: 0.2 10*3/uL (ref 0.0–0.7)
Eosinophils Relative: 3.9 % (ref 0.0–5.0)
HCT: 45.3 % (ref 39.0–52.0)
Hemoglobin: 15.3 g/dL (ref 13.0–17.0)
Lymphocytes Relative: 28.2 % (ref 12.0–46.0)
Lymphs Abs: 1.5 10*3/uL (ref 0.7–4.0)
MCHC: 33.8 g/dL (ref 30.0–36.0)
MCV: 85.6 fl (ref 78.0–100.0)
Monocytes Absolute: 0.4 10*3/uL (ref 0.1–1.0)
Monocytes Relative: 7.8 % (ref 3.0–12.0)
Neutro Abs: 3.2 10*3/uL (ref 1.4–7.7)
Neutrophils Relative %: 59.2 % (ref 43.0–77.0)
Platelets: 271 10*3/uL (ref 150.0–400.0)
RBC: 5.29 Mil/uL (ref 4.22–5.81)
RDW: 13.4 % (ref 11.5–15.5)
WBC: 5.4 10*3/uL (ref 4.0–10.5)

## 2020-09-16 LAB — HEPATIC FUNCTION PANEL
ALT: 25 U/L (ref 0–53)
AST: 16 U/L (ref 0–37)
Albumin: 3.9 g/dL (ref 3.5–5.2)
Alkaline Phosphatase: 80 U/L (ref 39–117)
Bilirubin, Direct: 0.1 mg/dL (ref 0.0–0.3)
Total Bilirubin: 0.6 mg/dL (ref 0.2–1.2)
Total Protein: 6.5 g/dL (ref 6.0–8.3)

## 2020-09-16 LAB — TSH: TSH: 1.11 u[IU]/mL (ref 0.35–4.50)

## 2020-09-16 LAB — T4, FREE: Free T4: 0.78 ng/dL (ref 0.60–1.60)

## 2020-09-16 NOTE — Progress Notes (Signed)
Patient ID: Samuel Last., male   DOB: Feb 18, 1966, 56 y.o.   MRN: [id number]   Subjective:    Patient ID: Samuel Last., male    DOB: [birth year], 55 y.o.   MRN: [id number]  HPI This visit occurred during the SARS-CoV-2 public health emergency.  Safety protocols were in place, including screening questions prior to the visit, additional usage of staff PPE, and extensive cleaning of exam room while observing appropriate contact time as indicated for disinfecting solutions.  Patient here for a scheduled follow up. Here to follow up regarding his cholesterol and blood pressure.  He is doing well.  Retired in January.  Working part time at [employer]  Staying active.  No chest pain or sob reported.  No abdominal pain or bowel change reported.  Has lost weight.  Staying active.  Does have "cyst" right elbow.  Noticed over the last several months.  No pain.  Has increased pressure on the elbow - while working on computer.  Good rom.    Past Medical History:  Diagnosis Date  . History of chicken pox   . Hypercholesterolemia   . Hypertension    Past Surgical History:  Procedure Laterality Date  . TUMOR REMOVAL  7/08 & 5/14   left shoulder, lipoma   Family History  Problem Relation Age of Onset  . Hyperlipidemia Mother   . Hypertension Mother   . Hyperlipidemia Father   . Kidney cancer Father   . Hyperlipidemia Paternal Aunt   . Colon cancer Paternal Uncle   . Prostate cancer Paternal Uncle   . Hyperlipidemia Paternal Uncle   . Hyperlipidemia Paternal Grandmother   . Hyperlipidemia Paternal Grandfather    Social History   Socioeconomic History  . Marital status: Married    Spouse name: Not on file  . Number of children: Not on file  . Years of education: Not on file  . Highest education level: Not on file  Occupational History  . Not on file  Tobacco Use  . Smoking status: Never Smoker  . Smokeless tobacco: Never Used  Substance and Sexual Activity   . Alcohol use: Yes    Alcohol/week: 0.0 standard drinks    Comment: rarley  . Drug use: No  . Sexual activity: Not on file  Other Topics Concern  . Not on file  Social History Narrative  . Not on file   Social Determinants of Health   Financial Resource Strain: Not on file  Food Insecurity: Not on file  Transportation Needs: Not on file  Physical Activity: Not on file  Stress: Not on file  Social Connections: Not on file    Outpatient Encounter Medications as of 09/16/2020  Medication Sig  . aspirin 81 MG tablet Take 81 mg by mouth daily.  Marland Kitchen lisinopril (ZESTRIL) 20 MG tablet Take one tablet daily.  . rosuvastatin (CRESTOR) 20 MG tablet Take 1 tablet (20 mg total) by mouth daily.   No facility-administered encounter medications on file as of 09/16/2020.    Review of Systems  Constitutional: Negative for appetite change and unexpected weight change.  HENT: Negative for congestion and sinus pressure.   Respiratory: Negative for cough, chest tightness and shortness of breath.   Cardiovascular: Negative for chest pain, palpitations and leg swelling.  Gastrointestinal: Negative for abdominal pain, diarrhea, nausea and vomiting.  Genitourinary: Negative for difficulty urinating and dysuria.  Musculoskeletal: Negative for joint swelling and myalgias.       Elbow "cyst".  Skin: Negative for color change and rash.  Neurological: Negative for dizziness, light-headedness and headaches.  Psychiatric/Behavioral: Negative for agitation and dysphoric mood.       Objective:    Physical Exam Vitals reviewed.  Constitutional:      General: He is not in acute distress.    Appearance: Normal appearance. He is well-developed.  HENT:     Head: Normocephalic and atraumatic.     Right Ear: External ear normal.     Left Ear: External ear normal.  Eyes:     General: No scleral icterus.       Right eye: No discharge.        Left eye: No discharge.     Conjunctiva/sclera: Conjunctivae  normal.  Cardiovascular:     Rate and Rhythm: Normal rate and regular rhythm.  Pulmonary:     Effort: Pulmonary effort is normal. No respiratory distress.     Breath sounds: Normal breath sounds.  Abdominal:     General: Bowel sounds are normal.     Palpations: Abdomen is soft.     Tenderness: There is no abdominal tenderness.  Musculoskeletal:        General: No swelling or tenderness.     Cervical back: Neck supple. No tenderness.     Comments: Small circular - soft tissue fullness - right elbow.  Non tender.  No increased erythema.  Good rom.   Lymphadenopathy:     Cervical: No cervical adenopathy.  Skin:    Findings: No erythema or rash.  Neurological:     Mental Status: He is alert.  Psychiatric:        Mood and Affect: Mood normal.        Behavior: Behavior normal.     BP 126/76   Pulse 64   Temp 97.8 F (36.6 C)   Resp 16   Ht 5\' 8"  (1.727 m)   Wt 232 lb (105.2 kg)   SpO2 98%   BMI 35.28 kg/m  Wt Readings from Last 3 Encounters:  09/16/20 232 lb (105.2 kg)  03/18/20 241 lb (109.3 kg)  07/21/19 248 lb (112.5 kg)     Lab Results  Component Value Date   WBC 5.4 09/16/2020   HGB 15.3 09/16/2020   HCT 45.3 09/16/2020   PLT 271.0 09/16/2020   GLUCOSE 98 09/16/2020   CHOL 145 09/16/2020   TRIG 100.0 09/16/2020   HDL 40.10 09/16/2020   LDLDIRECT 166.3 05/08/2013   LDLCALC 85 09/16/2020   ALT 25 09/16/2020   AST 16 09/16/2020   NA 141 09/16/2020   K 4.3 09/16/2020   CL 106 09/16/2020   CREATININE 1.12 09/16/2020   BUN 11 09/16/2020   CO2 29 09/16/2020   TSH 1.11 09/16/2020   PSA 0.37 03/18/2020   HGBA1C 5.6 12/05/2016       Assessment & Plan:   Problem List Items Addressed This Visit    Essential hypertension, benign - Primary    Continue lisinopril.  Blood pressure as outlined.  No changes.  Follow pressure.  Follow metabolic panel.       Relevant Orders   Hepatic function panel (Completed)   Lipid panel (Completed)   CBC with  Differential/Platelet (Completed)   Hypercholesterolemia    Continue crestor.  Low cholesterol diet and exercise.  Follow lipid panel and liver function tests.        Relevant Orders   Basic metabolic panel (Completed)   TSH (Completed)   Other bursal cyst, right elbow  Discussed.  Will try to avoid direct pressure on elbows.  Follow. Notify me if persistent.       Thyroid nodule    Saw Dr Gabriel Carina previously. Ultrasound revealed multinodular goiter.  Per pt, no further w/up warranted.  Given increased fullness, will recheck ultrasound to confirm stable. Check tsh and free T4.        Relevant Orders   T4, free (Completed)   US THYROID    Other Visit Diagnoses    Need for hepatitis C screening test       Relevant Orders   Hepatitis C antibody   Screening for HIV (human immunodeficiency virus)       Relevant Orders   HIV antibody (with reflex)       Einar Pheasant, MD

## 2020-09-17 ENCOUNTER — Encounter: Payer: Self-pay | Admitting: Internal Medicine

## 2020-09-17 DIAGNOSIS — M71321 Other bursal cyst, right elbow: Secondary | ICD-10-CM | POA: Insufficient documentation

## 2020-09-17 NOTE — Assessment & Plan Note (Signed)
Saw Dr Gabriel Carina previously. Ultrasound revealed multinodular goiter.  Per pt, no further w/up warranted.  Given increased fullness, will recheck ultrasound to confirm stable. Check tsh and free T4.

## 2020-09-17 NOTE — Assessment & Plan Note (Signed)
Discussed.  Will try to avoid direct pressure on elbows.  Follow. Notify me if persistent.

## 2020-09-17 NOTE — Assessment & Plan Note (Signed)
Continue crestor.  Low cholesterol diet and exercise. Follow lipid panel and liver function tests.   

## 2020-09-17 NOTE — Assessment & Plan Note (Signed)
Continue lisinopril.  Blood pressure as outlined.  No changes.  Follow pressure.  Follow metabolic panel.

## 2020-09-19 LAB — HEPATITIS C ANTIBODY
Hepatitis C Ab: NONREACTIVE
SIGNAL TO CUT-OFF: 0.01 (ref <1.00)

## 2020-09-19 LAB — HIV ANTIBODY (ROUTINE TESTING W REFLEX): HIV 1&2 Ab, 4th Generation: NONREACTIVE

## 2020-09-20 ENCOUNTER — Telehealth: Payer: Self-pay | Admitting: Internal Medicine

## 2020-09-20 NOTE — Telephone Encounter (Signed)
lft vm for pt to call ofc to sch US thyroid. thanks

## 2020-10-27 ENCOUNTER — Ambulatory Visit
Admission: RE | Admit: 2020-10-27 | Discharge: 2020-10-27 | Disposition: A | Discharge status: Home / Self Care | Payer: BC Managed Care – PPO | Source: Ambulatory Visit | Attending: Internal Medicine | Admitting: Internal Medicine

## 2020-10-27 ENCOUNTER — Other Ambulatory Visit: Payer: Self-pay

## 2020-10-27 DIAGNOSIS — E041 Nontoxic single thyroid nodule: Secondary | ICD-10-CM | POA: Insufficient documentation

## 2020-11-04 ENCOUNTER — Other Ambulatory Visit: Payer: Self-pay | Admitting: Internal Medicine

## 2020-11-14 ENCOUNTER — Telehealth: Payer: Self-pay

## 2020-11-14 DIAGNOSIS — E041 Nontoxic single thyroid nodule: Secondary | ICD-10-CM

## 2020-11-14 NOTE — Telephone Encounter (Signed)
Patient is aware of thyroid ultrasound results and is agreeable to see endocrinology for his thyroid nodules. Patient previously saw Dr Gabriel Carina. Ok to refer back to them.

## 2020-11-15 NOTE — Addendum Note (Signed)
Addended by: Alisa Graff on: 11/15/2020 10:31 PM   Modules accepted: Orders

## 2020-11-15 NOTE — Telephone Encounter (Signed)
Order placed for referral to endocrinology.  

## 2020-11-22 ENCOUNTER — Telehealth: Payer: Self-pay | Admitting: Internal Medicine

## 2020-11-22 NOTE — Telephone Encounter (Signed)
Rejection Reason - Other - Current patient of Dr. Gabriel Carina, no referral needed. Patient will be contacted to schedule follow up appointment. Please ask patient to call our office at [phone] to schedule a follow up appointment." Doyle Askew said on Nov 21, 2020 9:58 AM  Msg from [facility] endo

## 2021-02-01 ENCOUNTER — Other Ambulatory Visit: Payer: Self-pay | Admitting: Internal Medicine

## 2021-03-20 ENCOUNTER — Ambulatory Visit (INDEPENDENT_AMBULATORY_CARE_PROVIDER_SITE_OTHER): Payer: BC Managed Care – PPO | Admitting: Internal Medicine

## 2021-03-20 ENCOUNTER — Encounter: Payer: Self-pay | Admitting: Internal Medicine

## 2021-03-20 ENCOUNTER — Other Ambulatory Visit: Payer: Self-pay

## 2021-03-20 VITALS — BP 138/80 | HR 67 | Temp 97.6°F | Resp 16 | Ht 68.0 in | Wt 232.0 lb

## 2021-03-20 DIAGNOSIS — E041 Nontoxic single thyroid nodule: Secondary | ICD-10-CM | POA: Diagnosis not present

## 2021-03-20 DIAGNOSIS — Z1211 Encounter for screening for malignant neoplasm of colon: Secondary | ICD-10-CM

## 2021-03-20 DIAGNOSIS — Z125 Encounter for screening for malignant neoplasm of prostate: Secondary | ICD-10-CM

## 2021-03-20 DIAGNOSIS — I10 Essential (primary) hypertension: Secondary | ICD-10-CM

## 2021-03-20 DIAGNOSIS — Z0000 Encounter for general adult medical examination without abnormal findings: Secondary | ICD-10-CM

## 2021-03-20 DIAGNOSIS — E7800 Pure hypercholesterolemia, unspecified: Secondary | ICD-10-CM

## 2021-03-20 LAB — LIPID PANEL
Cholesterol: 147 mg/dL (ref 0–200)
HDL: 41.5 mg/dL (ref >39.00)
LDL Cholesterol: 85 mg/dL (ref 0–99)
NonHDL: 105.38
Total CHOL/HDL Ratio: 4
Triglycerides: 101 mg/dL (ref 0.0–149.0)
VLDL: 20.2 mg/dL (ref 0.0–40.0)

## 2021-03-20 LAB — HEPATIC FUNCTION PANEL
ALT: 19 U/L (ref 0–53)
AST: 17 U/L (ref 0–37)
Albumin: 4.2 g/dL (ref 3.5–5.2)
Alkaline Phosphatase: 81 U/L (ref 39–117)
Bilirubin, Direct: 0.1 mg/dL (ref 0.0–0.3)
Total Bilirubin: 0.7 mg/dL (ref 0.2–1.2)
Total Protein: 6.7 g/dL (ref 6.0–8.3)

## 2021-03-20 LAB — BASIC METABOLIC PANEL
BUN: 8 mg/dL (ref 6–23)
CO2: 30 mEq/L (ref 19–32)
Calcium: 10.2 mg/dL (ref 8.4–10.5)
Chloride: 104 mEq/L (ref 96–112)
Creatinine, Ser: 1.09 mg/dL (ref 0.40–1.50)
GFR: 75.72 mL/min (ref >60.00)
Glucose, Bld: 90 mg/dL (ref 70–99)
Potassium: 4 mEq/L (ref 3.5–5.1)
Sodium: 141 mEq/L (ref 135–145)

## 2021-03-20 LAB — TSH: TSH: 1.23 u[IU]/mL (ref 0.35–5.50)

## 2021-03-20 LAB — PSA: PSA: 0.31 ng/mL (ref 0.10–4.00)

## 2021-03-20 MED ORDER — LISINOPRIL 20 MG PO TABS: 20.0000 mg | ORAL_TABLET | Freq: Every day | ORAL | 3 refills | Status: DC

## 2021-03-20 MED ORDER — ROSUVASTATIN CALCIUM 20 MG PO TABS: 20.0000 mg | ORAL_TABLET | Freq: Every day | ORAL | 3 refills | Status: DC

## 2021-03-20 NOTE — Assessment & Plan Note (Signed)
Continue crestor.  Low cholesterol diet and exercise. Follow lipid panel and liver function tests.   

## 2021-03-20 NOTE — Assessment & Plan Note (Signed)
Continue lisinopril.  Blood pressure as outlined.  No changes.  Follow pressure.  Will bring his cuff to next visit. Follow metabolic panel.

## 2021-03-20 NOTE — Assessment & Plan Note (Signed)
Saw Dr Gabriel Carina.  S/p biopsies - ok.  Felt no further w/up warranted.  Follow.  Check tsh today.

## 2021-03-20 NOTE — Assessment & Plan Note (Addendum)
Physical today 03/20/21.  Colonoscopy 03/2015. - diverticulosis.  IFOB today. Check psa.

## 2021-03-20 NOTE — Progress Notes (Signed)
Patient ID: [name], male   DOB: [birth year], 55 y.o.   MRN: [id number]   Subjective:    Patient ID: [name], male    DOB: 08/25/1965, 55 y.o.   MRN: [id number]  This visit occurred during the SARS-CoV-2 public health emergency.  Safety protocols were in place, including screening questions prior to the visit, additional usage of staff PPE, and extensive cleaning of exam room while observing appropriate contact time as indicated for disinfecting solutions.   Patient here for his physical exam.   Chief Complaint  Patient presents with   Annual Exam   .   HPI He is doing well.  Working part time at [employer].  Stays active. No chest pain or sob reported.  No abdominal pain.  Bowels moving.  No blood.  No cough or congestion.  Feels good.  Outside blood pressures averaging low 120s/70s.     Past Medical History:  Diagnosis Date   History of chicken pox    Hypercholesterolemia    Hypertension    Past Surgical History:  Procedure Laterality Date   TUMOR REMOVAL  7/08 & 5/14   left shoulder, lipoma   Family History  Problem Relation Age of Onset   Hyperlipidemia Mother    Hypertension Mother    Hyperlipidemia Father    Kidney cancer Father    Hyperlipidemia Paternal Aunt    Colon cancer Paternal Uncle    Prostate cancer Paternal Uncle    Hyperlipidemia Paternal Uncle    Hyperlipidemia Paternal Grandmother    Hyperlipidemia Paternal Grandfather    Social History   Socioeconomic History   Marital status: Married    Spouse name: Not on file   Number of children: Not on file   Years of education: Not on file   Highest education level: Not on file  Occupational History   Not on file  Tobacco Use   Smoking status: Never   Smokeless tobacco: Never  Substance and Sexual Activity   Alcohol use: Yes    Alcohol/week: 0.0 standard drinks    Comment: rarley   Drug use: No   Sexual activity: Not on file  Other Topics Concern   Not on file   Social History Narrative   Not on file   Social Determinants of Health   Financial Resource Strain: Not on file  Food Insecurity: Not on file  Transportation Needs: Not on file  Physical Activity: Not on file  Stress: Not on file  Social Connections: Not on file     Review of Systems  Constitutional:  Negative for appetite change and unexpected weight change.  HENT:  Negative for congestion, sinus pressure and sore throat.   Eyes:  Negative for pain and visual disturbance.  Respiratory:  Negative for cough, chest tightness and shortness of breath.   Cardiovascular:  Negative for chest pain, palpitations and leg swelling.  Gastrointestinal:  Negative for abdominal pain, diarrhea, nausea and vomiting.  Genitourinary:  Negative for difficulty urinating and dysuria.  Musculoskeletal:  Negative for joint swelling and myalgias.  Skin:  Negative for color change and rash.  Neurological:  Negative for dizziness, light-headedness and headaches.  Hematological:  Negative for adenopathy. Does not bruise/bleed easily.  Psychiatric/Behavioral:  Negative for agitation and dysphoric mood.       Objective:     BP 138/80   Pulse 67   Temp 97.6 F (36.4 C)   Resp 16   Ht 5\' 8"  (1.727 m)  Wt 232 lb (105.2 kg)   SpO2 98%   BMI 35.28 kg/m  Wt Readings from Last 3 Encounters:  03/20/21 232 lb (105.2 kg)  09/16/20 232 lb (105.2 kg)  03/18/20 241 lb (109.3 kg)    Physical Exam Constitutional:      General: He is not in acute distress.    Appearance: Normal appearance. He is well-developed.  HENT:     Head: Normocephalic and atraumatic.     Right Ear: External ear normal.     Left Ear: External ear normal.  Eyes:     General: No scleral icterus.       Right eye: No discharge.        Left eye: No discharge.     Conjunctiva/sclera: Conjunctivae normal.  Neck:     Thyroid: No thyromegaly.  Cardiovascular:     Rate and Rhythm: Normal rate and regular rhythm.  Pulmonary:      Effort: No respiratory distress.     Breath sounds: Normal breath sounds. No wheezing.  Abdominal:     General: Bowel sounds are normal.     Palpations: Abdomen is soft.     Tenderness: There is no abdominal tenderness.  Musculoskeletal:        General: No swelling or tenderness.     Cervical back: Neck supple. No tenderness.  Lymphadenopathy:     Cervical: No cervical adenopathy.  Skin:    Findings: No erythema or rash.  Neurological:     Mental Status: He is alert and oriented to person, place, and time.  Psychiatric:        Mood and Affect: Mood normal.        Behavior: Behavior normal.     Outpatient Encounter Medications as of 03/20/2021  Medication Sig   aspirin 81 MG tablet Take 81 mg by mouth daily.   [DISCONTINUED] lisinopril (ZESTRIL) 20 MG tablet Take one tablet daily.   [DISCONTINUED] rosuvastatin (CRESTOR) 20 MG tablet Take 1 tablet (20 mg total) by mouth daily.   lisinopril (ZESTRIL) 20 MG tablet Take 1 tablet (20 mg total) by mouth daily.   rosuvastatin (CRESTOR) 20 MG tablet Take 1 tablet (20 mg total) by mouth daily.   No facility-administered encounter medications on file as of 03/20/2021.     Lab Results  Component Value Date   WBC 5.4 09/16/2020   HGB 15.3 09/16/2020   HCT 45.3 09/16/2020   PLT 271.0 09/16/2020   GLUCOSE 98 09/16/2020   CHOL 145 09/16/2020   TRIG 100.0 09/16/2020   HDL 40.10 09/16/2020   LDLDIRECT 166.3 05/08/2013   LDLCALC 85 09/16/2020   ALT 25 09/16/2020   AST 16 09/16/2020   NA 141 09/16/2020   K 4.3 09/16/2020   CL 106 09/16/2020   CREATININE 1.12 09/16/2020   BUN 11 09/16/2020   CO2 29 09/16/2020   TSH 1.11 09/16/2020   PSA 0.37 03/18/2020   HGBA1C 5.6 12/05/2016    US THYROID  Result Date: 10/28/2020 CLINICAL DATA:  Thyroid fullness Thyroid nodule follow-up EXAM: THYROID ULTRASOUND TECHNIQUE: Ultrasound examination of the thyroid gland and adjacent soft tissues was performed. COMPARISON:  12/18/2016 FINDINGS:  Parenchymal Echotexture: Mildly heterogeneous Isthmus: 0.8 cm Right lobe: 6.3 x 3.8 x 3.3 cm Left lobe: 6.5 x 3.3 x 3.2 cm _________________________________________________________ Estimated total number of nodules >/= 1 cm: 4 Number of spongiform nodules >/=  2 cm not described below (TR1): 0 Number of mixed cystic and solid nodules >/= 1.5 cm not described  below ([place]): 0 _________________________________________________________ Nodule # 1: 1.3 x 0.7 x 1.2 cm predominantly cystic nodule in the superior right thyroid lobe does not meet criteria for FNA or surveillance. _________________________________________________________ Nodule # 2: Prior biopsy: No Location: Right; inferior Maximum size: 4.1 cm; Other 2 dimensions: 3.1 x 2.9 cm, previously, 3.4 x 2.9 x 2.5 cm on 12/18/2016 and 3.4 x 2.4 x 2.5 cm on 04/27/2014. Composition: solid/almost completely solid (2) Echogenicity: isoechoic (1) Shape: taller-than-wide (3) Margins: ill-defined (0) Echogenic foci: none (0) ACR TI-RADS total points: 6. ACR TI-RADS risk category:  TR4 (4-6 points). Significant change in size (>/= 20% in two dimensions and minimal increase of 2 mm): No Change in features: No Change in ACR TI-RADS risk category: No ACR TI-RADS recommendations: **Given size (>/= 1.5 cm) and appearance, fine needle aspiration of this moderately suspicious nodule should be considered based on TI-RADS criteria. _________________________________________________________ Nodule # 3: Prior biopsy: No Location: Right; mid Maximum size: 1.3 cm; Other 2 dimensions: 0.9 x 1.2 cm, previously, 1.6 x 1.5 x 0.6 cm Composition: solid/almost completely solid (2) Echogenicity: isoechoic (1) Shape: not taller-than-wide (0) Margins: ill-defined (0) Echogenic foci: none (0) ACR TI-RADS total points: 3. ACR TI-RADS risk category:  TR3 (3 points). Significant change in size (>/= 20% in two dimensions and minimal increase of 2 mm): No Change in features: No Change in ACR TI-RADS risk  category: No ACR TI-RADS recommendations: Given size (<1.4 cm) and appearance, this nodule does NOT meet TI-RADS criteria for biopsy or dedicated follow-up. _________________________________________________________ Nodule # 4: Prior biopsy: No Location: Left; inferior Maximum size: 3.8 cm; Other 2 dimensions: 2.9 x 2.6 cm, previously, 3.9 x 2.9 x 3.4 cm on 12/18/2016 and 3.6 x 2.5 x 2.7 cm on 04/27/2014. Composition: solid/almost completely solid (2) Echogenicity: isoechoic (1) Shape: taller-than-wide (3) Margins: smooth (0) Echogenic foci: none (0) ACR TI-RADS total points: 6. ACR TI-RADS risk category:  TR4 (4-6 points). Significant change in size (>/= 20% in two dimensions and minimal increase of 2 mm): No Change in features: No Change in ACR TI-RADS risk category: No ACR TI-RADS recommendations: **Given size (>/= 1.5 cm) and appearance, fine needle aspiration of this moderately suspicious nodule should be considered based on TI-RADS criteria. _________________________________________________________ IMPRESSION: Nodule 2 in the inferior right thyroid lobe and nodule 4 in the inferior left thyroid lobe both meet criteria for FNA. Although given that these nodules demonstrate minimal growth since 2015, they are likely benign. The above is in keeping with the ACR TI-RADS recommendations - J Am Coll Radiol 2017;14:587-595. Electronically Signed   By: Miachel Roux M.D.   On: 10/28/2020 11:37       Assessment & Plan:   Problem List Items Addressed This Visit     Essential hypertension, benign    Continue lisinopril.  Blood pressure as outlined.  No changes.  Follow pressure.  Will bring his cuff to next visit. Follow metabolic panel.       Relevant Medications   lisinopril (ZESTRIL) 20 MG tablet   rosuvastatin (CRESTOR) 20 MG tablet   Other Relevant Orders   Basic metabolic panel   Health care maintenance    Physical today 03/20/21.  Colonoscopy 03/2015. - diverticulosis.  IFOB today. Check psa.        Hypercholesterolemia    Continue crestor.  Low cholesterol diet and exercise.  Follow lipid panel and liver function tests.        Relevant Medications   lisinopril (ZESTRIL) 20 MG tablet  rosuvastatin (CRESTOR) 20 MG tablet   Other Relevant Orders   Hepatic function panel   Lipid panel   Thyroid nodule    Saw Dr Gabriel Carina.  S/p biopsies - ok.  Felt no further w/up warranted.  Follow.  Check tsh today.       Relevant Orders   TSH   Other Visit Diagnoses     Routine general medical examination at a health care facility    -  Primary   Prostate cancer screening       Relevant Orders   PSA   Colon cancer screening       Relevant Orders   Fecal occult blood, imunochemical([facility])        Einar Pheasant, MD

## 2021-07-19 ENCOUNTER — Ambulatory Visit: Payer: BC Managed Care – PPO | Admitting: Internal Medicine

## 2021-07-19 ENCOUNTER — Encounter: Payer: Self-pay | Admitting: Internal Medicine

## 2021-07-19 ENCOUNTER — Other Ambulatory Visit (INDEPENDENT_AMBULATORY_CARE_PROVIDER_SITE_OTHER): Payer: BC Managed Care – PPO

## 2021-07-19 ENCOUNTER — Other Ambulatory Visit: Payer: Self-pay

## 2021-07-19 VITALS — BP 132/80 | HR 64 | Temp 98.0°F | Resp 16 | Ht 69.0 in | Wt 229.0 lb

## 2021-07-19 DIAGNOSIS — E7800 Pure hypercholesterolemia, unspecified: Secondary | ICD-10-CM

## 2021-07-19 DIAGNOSIS — I10 Essential (primary) hypertension: Secondary | ICD-10-CM

## 2021-07-19 DIAGNOSIS — Z1211 Encounter for screening for malignant neoplasm of colon: Secondary | ICD-10-CM | POA: Diagnosis not present

## 2021-07-19 DIAGNOSIS — E041 Nontoxic single thyroid nodule: Secondary | ICD-10-CM | POA: Diagnosis not present

## 2021-07-19 LAB — CBC WITH DIFFERENTIAL/PLATELET
Basophils Absolute: 0.1 10*3/uL (ref 0.0–0.1)
Basophils Relative: 1.2 % (ref 0.0–3.0)
Eosinophils Absolute: 0.2 10*3/uL (ref 0.0–0.7)
Eosinophils Relative: 3.7 % (ref 0.0–5.0)
HCT: 44.9 % (ref 39.0–52.0)
Hemoglobin: 15.3 g/dL (ref 13.0–17.0)
Lymphocytes Relative: 29.6 % (ref 12.0–46.0)
Lymphs Abs: 1.5 10*3/uL (ref 0.7–4.0)
MCHC: 34.2 g/dL (ref 30.0–36.0)
MCV: 85.9 fl (ref 78.0–100.0)
Monocytes Absolute: 0.5 10*3/uL (ref 0.1–1.0)
Monocytes Relative: 9 % (ref 3.0–12.0)
Neutro Abs: 2.9 10*3/uL (ref 1.4–7.7)
Neutrophils Relative %: 56.5 % (ref 43.0–77.0)
Platelets: 249 10*3/uL (ref 150.0–400.0)
RBC: 5.23 Mil/uL (ref 4.22–5.81)
RDW: 13.3 % (ref 11.5–15.5)
WBC: 5.1 10*3/uL (ref 4.0–10.5)

## 2021-07-19 LAB — HEPATIC FUNCTION PANEL
ALT: 23 U/L (ref 0–53)
AST: 18 U/L (ref 0–37)
Albumin: 4.1 g/dL (ref 3.5–5.2)
Alkaline Phosphatase: 73 U/L (ref 39–117)
Bilirubin, Direct: 0.1 mg/dL (ref 0.0–0.3)
Total Bilirubin: 0.6 mg/dL (ref 0.2–1.2)
Total Protein: 6.9 g/dL (ref 6.0–8.3)

## 2021-07-19 LAB — FECAL OCCULT BLOOD, IMMUNOCHEMICAL: Fecal Occult Bld: NEGATIVE

## 2021-07-19 LAB — LIPID PANEL
Cholesterol: 138 mg/dL (ref 0–200)
HDL: 46.6 mg/dL (ref >39.00)
LDL Cholesterol: 76 mg/dL (ref 0–99)
NonHDL: 91.49
Total CHOL/HDL Ratio: 3
Triglycerides: 79 mg/dL (ref 0.0–149.0)
VLDL: 15.8 mg/dL (ref 0.0–40.0)

## 2021-07-19 LAB — BASIC METABOLIC PANEL
BUN: 11 mg/dL (ref 6–23)
CO2: 29 mEq/L (ref 19–32)
Calcium: 10.1 mg/dL (ref 8.4–10.5)
Chloride: 108 mEq/L (ref 96–112)
Creatinine, Ser: 1.05 mg/dL (ref 0.40–1.50)
GFR: 79.01 mL/min (ref >60.00)
Glucose, Bld: 98 mg/dL (ref 70–99)
Potassium: 4 mEq/L (ref 3.5–5.1)
Sodium: 142 mEq/L (ref 135–145)

## 2021-07-19 NOTE — Assessment & Plan Note (Signed)
Saw Dr Gabriel Carina.  S/p biopsies - ok.  Felt no further w/up warranted.  Follow.  TSH 02/2021 - wnl.   ?

## 2021-07-19 NOTE — Progress Notes (Signed)
Patient ID: Samuel Last., male   DOB: Jun 29, 1965, 56 y.o.   MRN: [id number] ? ? ?Subjective:  ? ? Patient ID: Samuel Last., male    DOB: 1965-08-26, 56 y.o.   MRN: [id number] ? ?This visit occurred during the SARS-CoV-2 public health emergency.  Safety protocols were in place, including screening questions prior to the visit, additional usage of staff PPE, and extensive cleaning of exam room while observing appropriate contact time as indicated for disinfecting solutions.  ? ?Patient here for a scheduled follow up.  ? ?Chief Complaint  ?Patient presents with  ? Follow-up  ?  4 mo f/u - HTN  ? .  ? ?HPI ?Follow up regarding his blood pressure.  Brought his home cuff.  Blood pressure at home averaging 120s/70s.  Higher here in the office (160/90).  Appears to correlate with our cuff.  (150-152/90 on my check).  No chest pain or sob reported.  Tries to stay active.  No abdominal pain or bowel change reported.  Returned IFOB today.  ? ? ?Past Medical History:  ?Diagnosis Date  ? History of chicken pox   ? Hypercholesterolemia   ? Hypertension   ? ?Past Surgical History:  ?Procedure Laterality Date  ? TUMOR REMOVAL  7/08 & 5/14  ? left shoulder, lipoma  ? ?Family History  ?Problem Relation Age of Onset  ? Hyperlipidemia Mother   ? Hypertension Mother   ? Hyperlipidemia Father   ? Kidney cancer Father   ? Hyperlipidemia Paternal Aunt   ? Colon cancer Paternal Uncle   ? Prostate cancer Paternal Uncle   ? Hyperlipidemia Paternal Uncle   ? Hyperlipidemia Paternal Grandmother   ? Hyperlipidemia Paternal Grandfather   ? ?Social History  ? ?Socioeconomic History  ? Marital status: Married  ?  Spouse name: Not on file  ? Number of children: Not on file  ? Years of education: Not on file  ? Highest education level: Not on file  ?Occupational History  ? Not on file  ?Tobacco Use  ? Smoking status: Never  ? Smokeless tobacco: Never  ?Substance and Sexual Activity  ? Alcohol use: Yes  ?  Alcohol/week: 0.0  standard drinks  ?  Comment: rarley  ? Drug use: No  ? Sexual activity: Not on file  ?Other Topics Concern  ? Not on file  ?Social History Narrative  ? Not on file  ? ?Social Determinants of Health  ? ?Financial Resource Strain: Not on file  ?Food Insecurity: Not on file  ?Transportation Needs: Not on file  ?Physical Activity: Not on file  ?Stress: Not on file  ?Social Connections: Not on file  ? ? ? ?Review of Systems  ?Constitutional:  Negative for appetite change and unexpected weight change.  ?HENT:  Negative for congestion and sinus pressure.   ?Respiratory:  Negative for cough, chest tightness and shortness of breath.   ?Cardiovascular:  Negative for chest pain, palpitations and leg swelling.  ?Gastrointestinal:  Negative for abdominal pain, diarrhea, nausea and vomiting.  ?Genitourinary:  Negative for difficulty urinating and dysuria.  ?Musculoskeletal:  Negative for joint swelling and myalgias.  ?Skin:  Negative for color change and rash.  ?Neurological:  Negative for dizziness, light-headedness and headaches.  ?Psychiatric/Behavioral:  Negative for agitation and dysphoric mood.   ? ?   ?Objective:  ?  ? ?BP 132/80   Pulse 64   Temp 98 ?F (36.7 ?C)   Resp 16   Ht '5\' 9"'$  (1.753 m)  Wt 229 lb (103.9 kg)   SpO2 98%   BMI 33.82 kg/m?  ?Wt Readings from Last 3 Encounters:  ?07/19/21 229 lb (103.9 kg)  ?03/20/21 232 lb (105.2 kg)  ?09/16/20 232 lb (105.2 kg)  ? ? ?Physical Exam ?Constitutional:   ?   General: He is not in acute distress. ?   Appearance: Normal appearance. He is well-developed.  ?HENT:  ?   Head: Normocephalic and atraumatic.  ?   Right Ear: External ear normal.  ?   Left Ear: External ear normal.  ?Eyes:  ?   General: No scleral icterus.    ?   Right eye: No discharge.     ?   Left eye: No discharge.  ?Cardiovascular:  ?   Rate and Rhythm: Normal rate and regular rhythm.  ?Pulmonary:  ?   Effort: Pulmonary effort is normal. No respiratory distress.  ?   Breath sounds: Normal breath sounds.   ?Abdominal:  ?   General: Bowel sounds are normal.  ?   Palpations: Abdomen is soft.  ?   Tenderness: There is no abdominal tenderness.  ?Musculoskeletal:     ?   General: No swelling or tenderness.  ?   Cervical back: Neck supple. No tenderness.  ?Lymphadenopathy:  ?   Cervical: No cervical adenopathy.  ?Skin: ?   Findings: No erythema or rash.  ?Neurological:  ?   Mental Status: He is alert.  ?Psychiatric:     ?   Mood and Affect: Mood normal.     ?   Behavior: Behavior normal.  ? ? ? ?Outpatient Encounter Medications as of 07/19/2021  ?Medication Sig  ? aspirin 81 MG tablet Take 81 mg by mouth daily.  ? lisinopril (ZESTRIL) 20 MG tablet Take 1 tablet (20 mg total) by mouth daily.  ? rosuvastatin (CRESTOR) 20 MG tablet Take 1 tablet (20 mg total) by mouth daily.  ? ?No facility-administered encounter medications on file as of 07/19/2021.  ?  ? ?Lab Results  ?Component Value Date  ? WBC 5.4 09/16/2020  ? HGB 15.3 09/16/2020  ? HCT 45.3 09/16/2020  ? PLT 271.0 09/16/2020  ? GLUCOSE 90 03/20/2021  ? CHOL 147 03/20/2021  ? TRIG 101.0 03/20/2021  ? HDL 41.50 03/20/2021  ? LDLDIRECT 166.3 05/08/2013  ? [place] 85 03/20/2021  ? ALT 19 03/20/2021  ? AST 17 03/20/2021  ? NA 141 03/20/2021  ? K 4.0 03/20/2021  ? CL 104 03/20/2021  ? CREATININE 1.09 03/20/2021  ? BUN 8 03/20/2021  ? CO2 30 03/20/2021  ? TSH 1.23 03/20/2021  ? PSA 0.31 03/20/2021  ? HGBA1C 5.6 12/05/2016  ? ? ?US THYROID ? ?Result Date: 10/28/2020 ?CLINICAL DATA:  Thyroid fullness Thyroid nodule follow-up EXAM: THYROID ULTRASOUND TECHNIQUE: Ultrasound examination of the thyroid gland and adjacent soft tissues was performed. COMPARISON:  12/18/2016 FINDINGS: Parenchymal Echotexture: Mildly heterogeneous Isthmus: 0.8 cm Right lobe: 6.3 x 3.8 x 3.3 cm Left lobe: 6.5 x 3.3 x 3.2 cm _________________________________________________________ Estimated total number of nodules >/= 1 cm: 4 Number of spongiform nodules >/=  2 cm not described below (TR1): 0 Number of mixed  cystic and solid nodules >/= 1.5 cm not described below (TR2): 0 _________________________________________________________ Nodule # 1: 1.3 x 0.7 x 1.2 cm predominantly cystic nodule in the superior right thyroid lobe does not meet criteria for FNA or surveillance. _________________________________________________________ Nodule # 2: Prior biopsy: No Location: Right; inferior Maximum size: 4.1 cm; Other 2 dimensions: 3.1 x 2.9  cm, previously, 3.4 x 2.9 x 2.5 cm on 12/18/2016 and 3.4 x 2.4 x 2.5 cm on 04/27/2014. Composition: solid/almost completely solid (2) Echogenicity: isoechoic (1) Shape: taller-than-wide (3) Margins: ill-defined (0) Echogenic foci: none (0) ACR TI-RADS total points: 6. ACR TI-RADS risk category:  TR4 (4-6 points). Significant change in size (>/= 20% in two dimensions and minimal increase of 2 mm): No Change in features: No Change in ACR TI-RADS risk category: No ACR TI-RADS recommendations: **Given size (>/= 1.5 cm) and appearance, fine needle aspiration of this moderately suspicious nodule should be considered based on TI-RADS criteria. _________________________________________________________ Nodule # 3: Prior biopsy: No Location: Right; mid Maximum size: 1.3 cm; Other 2 dimensions: 0.9 x 1.2 cm, previously, 1.6 x 1.5 x 0.6 cm Composition: solid/almost completely solid (2) Echogenicity: isoechoic (1) Shape: not taller-than-wide (0) Margins: ill-defined (0) Echogenic foci: none (0) ACR TI-RADS total points: 3. ACR TI-RADS risk category:  TR3 (3 points). Significant change in size (>/= 20% in two dimensions and minimal increase of 2 mm): No Change in features: No Change in ACR TI-RADS risk category: No ACR TI-RADS recommendations: Given size (<1.4 cm) and appearance, this nodule does NOT meet TI-RADS criteria for biopsy or dedicated follow-up. _________________________________________________________ Nodule # 4: Prior biopsy: No Location: Left; inferior Maximum size: 3.8 cm; Other 2 dimensions:  2.9 x 2.6 cm, previously, 3.9 x 2.9 x 3.4 cm on 12/18/2016 and 3.6 x 2.5 x 2.7 cm on 04/27/2014. Composition: solid/almost completely solid (2) Echogenicity: isoechoic (1) Shape: taller-than-wide (3) Margins: smoo

## 2021-07-19 NOTE — Assessment & Plan Note (Signed)
Continue lisinopril.  Blood pressure as outlined. His cuff does appear to correlate with ours.  Given blood pressures at home averaging 120s/70s, hold on making changes.  Follow pressure.  Follow metabolic panel.  ?

## 2021-07-19 NOTE — Assessment & Plan Note (Signed)
Continue crestor.  Low cholesterol diet and exercise. Follow lipid panel and liver function tests.   

## 2021-11-20 ENCOUNTER — Ambulatory Visit: Payer: BC Managed Care – PPO | Admitting: Internal Medicine

## 2021-11-20 ENCOUNTER — Encounter: Payer: Self-pay | Admitting: Internal Medicine

## 2021-11-20 VITALS — BP 138/84 | HR 62 | Temp 98.4°F | Resp 15 | Ht 69.0 in | Wt 229.4 lb

## 2021-11-20 DIAGNOSIS — E041 Nontoxic single thyroid nodule: Secondary | ICD-10-CM

## 2021-11-20 DIAGNOSIS — E7800 Pure hypercholesterolemia, unspecified: Secondary | ICD-10-CM

## 2021-11-20 DIAGNOSIS — I10 Essential (primary) hypertension: Secondary | ICD-10-CM

## 2021-11-20 LAB — LIPID PANEL
Cholesterol: 155 mg/dL (ref 0–200)
HDL: 46.4 mg/dL (ref >39.00)
LDL Cholesterol: 88 mg/dL (ref 0–99)
NonHDL: 108.51
Total CHOL/HDL Ratio: 3
Triglycerides: 104 mg/dL (ref 0.0–149.0)
VLDL: 20.8 mg/dL (ref 0.0–40.0)

## 2021-11-20 LAB — HEPATIC FUNCTION PANEL
ALT: 24 U/L (ref 0–53)
AST: 20 U/L (ref 0–37)
Albumin: 4.3 g/dL (ref 3.5–5.2)
Alkaline Phosphatase: 77 U/L (ref 39–117)
Bilirubin, Direct: 0.1 mg/dL (ref 0.0–0.3)
Total Bilirubin: 0.6 mg/dL (ref 0.2–1.2)
Total Protein: 6.8 g/dL (ref 6.0–8.3)

## 2021-11-20 LAB — BASIC METABOLIC PANEL
BUN: 8 mg/dL (ref 6–23)
CO2: 29 mEq/L (ref 19–32)
Calcium: 10.2 mg/dL (ref 8.4–10.5)
Chloride: 105 mEq/L (ref 96–112)
Creatinine, Ser: 1.11 mg/dL (ref 0.40–1.50)
GFR: 73.74 mL/min (ref >60.00)
Glucose, Bld: 88 mg/dL (ref 70–99)
Potassium: 4.2 mEq/L (ref 3.5–5.1)
Sodium: 141 mEq/L (ref 135–145)

## 2021-11-20 MED ORDER — AMLODIPINE BESYLATE 5 MG PO TABS: 5.0000 mg | ORAL_TABLET | Freq: Every day | ORAL | 4 refills | Status: DC

## 2021-11-20 MED ORDER — ROSUVASTATIN CALCIUM 20 MG PO TABS
20.0000 mg | ORAL_TABLET | Freq: Every day | ORAL | 3 refills | Status: DC
Start: 2021-11-20 — End: 2022-07-26

## 2021-11-20 MED ORDER — LISINOPRIL 20 MG PO TABS: 20.0000 mg | ORAL_TABLET | Freq: Every day | ORAL | 3 refills | Status: DC

## 2021-11-20 NOTE — Progress Notes (Signed)
Patient ID: Samuel Last., male   DOB: [birth year], 56 y.o.   MRN: [id number]   Subjective:    Patient ID: Samuel Last., male    DOB: 1965/09/14, 56 y.o.   MRN: [id number]   Patient here for a scheduled follow up.   Chief Complaint  Patient presents with   Hyperlipidemia   Hypertension   .   Hyperlipidemia Pertinent negatives include no chest pain, myalgias or shortness of breath.  Hypertension Pertinent negatives include no chest pain, headaches, palpitations or shortness of breath.   He reports he is doing well.  Feels good.  Stays active.  No chest pain or sob reported.  No abdominal pain.  Bowels moving.  States blood pressures at home averaging 130-147/70-low 80s.     Past Medical History:  Diagnosis Date   History of chicken pox    Hypercholesterolemia    Hypertension    Past Surgical History:  Procedure Laterality Date   TUMOR REMOVAL  7/08 & 5/14   left shoulder, lipoma   Family History  Problem Relation Age of Onset   Hyperlipidemia Mother    Hypertension Mother    Hyperlipidemia Father    Kidney cancer Father    Hyperlipidemia Paternal Aunt    Colon cancer Paternal Uncle    Prostate cancer Paternal Uncle    Hyperlipidemia Paternal Uncle    Hyperlipidemia Paternal Grandmother    Hyperlipidemia Paternal Grandfather    Social History   Socioeconomic History   Marital status: Married    Spouse name: Not on file   Number of children: Not on file   Years of education: Not on file   Highest education level: Not on file  Occupational History   Not on file  Tobacco Use   Smoking status: Never   Smokeless tobacco: Never  Substance and Sexual Activity   Alcohol use: Yes    Alcohol/week: 0.0 standard drinks of alcohol    Comment: rarley   Drug use: No   Sexual activity: Not on file  Other Topics Concern   Not on file  Social History Narrative   Not on file   Social Determinants of Health   Financial Resource Strain: Not on file   Food Insecurity: Not on file  Transportation Needs: Not on file  Physical Activity: Not on file  Stress: Not on file  Social Connections: Not on file     Review of Systems  Constitutional:  Negative for appetite change and unexpected weight change.  HENT:  Negative for congestion and sinus pressure.   Respiratory:  Negative for cough, chest tightness and shortness of breath.   Cardiovascular:  Negative for chest pain, palpitations and leg swelling.  Gastrointestinal:  Negative for abdominal pain, diarrhea, nausea and vomiting.  Genitourinary:  Negative for difficulty urinating and dysuria.  Musculoskeletal:  Negative for joint swelling and myalgias.  Skin:  Negative for color change and rash.  Neurological:  Negative for dizziness, light-headedness and headaches.  Psychiatric/Behavioral:  Negative for agitation and dysphoric mood.        Objective:     BP 138/84 (BP Location: Left Arm, Patient Position: Sitting, Cuff Size: Large)   Pulse 62   Temp 98.4 F (36.9 C) (Temporal)   Resp 15   Ht '5\' 9"'$  (1.753 m)   Wt 229 lb 6.4 oz (104.1 kg)   SpO2 97%   BMI 33.88 kg/m  Wt Readings from Last 3 Encounters:  11/20/21 229 lb 6.4 oz (104.1 kg)  07/19/21 229 lb (103.9 kg)  03/20/21 232 lb (105.2 kg)    Physical Exam Constitutional:      General: He is not in acute distress.    Appearance: Normal appearance. He is well-developed.  HENT:     Head: Normocephalic and atraumatic.     Right Ear: External ear normal.     Left Ear: External ear normal.  Eyes:     General: No scleral icterus.       Right eye: No discharge.        Left eye: No discharge.  Cardiovascular:     Rate and Rhythm: Normal rate and regular rhythm.  Pulmonary:     Effort: Pulmonary effort is normal. No respiratory distress.     Breath sounds: Normal breath sounds.  Abdominal:     General: Bowel sounds are normal.     Palpations: Abdomen is soft.     Tenderness: There is no abdominal tenderness.   Musculoskeletal:        General: No swelling or tenderness.     Cervical back: Neck supple. No tenderness.  Lymphadenopathy:     Cervical: No cervical adenopathy.  Skin:    Findings: No erythema or rash.  Neurological:     Mental Status: He is alert.  Psychiatric:        Mood and Affect: Mood normal.        Behavior: Behavior normal.      Outpatient Encounter Medications as of 11/20/2021  Medication Sig   amLODipine (NORVASC) 5 MG tablet Take 1 tablet (5 mg total) by mouth daily.   aspirin 81 MG tablet Take 81 mg by mouth daily.   [DISCONTINUED] lisinopril (ZESTRIL) 20 MG tablet Take 1 tablet (20 mg total) by mouth daily.   [DISCONTINUED] rosuvastatin (CRESTOR) 20 MG tablet Take 1 tablet (20 mg total) by mouth daily.   lisinopril (ZESTRIL) 20 MG tablet Take 1 tablet (20 mg total) by mouth daily.   rosuvastatin (CRESTOR) 20 MG tablet Take 1 tablet (20 mg total) by mouth daily.   No facility-administered encounter medications on file as of 11/20/2021.     Lab Results  Component Value Date   WBC 5.1 07/19/2021   HGB 15.3 07/19/2021   HCT 44.9 07/19/2021   PLT 249.0 07/19/2021   GLUCOSE 98 07/19/2021   CHOL 138 07/19/2021   TRIG 79.0 07/19/2021   HDL 46.60 07/19/2021   LDLDIRECT 166.3 05/08/2013   LDLCALC 76 07/19/2021   ALT 23 07/19/2021   AST 18 07/19/2021   NA 142 07/19/2021   K 4.0 07/19/2021   CL 108 07/19/2021   CREATININE 1.05 07/19/2021   BUN 11 07/19/2021   CO2 29 07/19/2021   TSH 1.23 03/20/2021   PSA 0.31 03/20/2021   HGBA1C 5.6 12/05/2016    US THYROID  Result Date: 10/28/2020 CLINICAL DATA:  Thyroid fullness Thyroid nodule follow-up EXAM: THYROID ULTRASOUND TECHNIQUE: Ultrasound examination of the thyroid gland and adjacent soft tissues was performed. COMPARISON:  12/18/2016 FINDINGS: Parenchymal Echotexture: Mildly heterogeneous Isthmus: 0.8 cm Right lobe: 6.3 x 3.8 x 3.3 cm Left lobe: 6.5 x 3.3 x 3.2 cm  _________________________________________________________ Estimated total number of nodules >/= 1 cm: 4 Number of spongiform nodules >/=  2 cm not described below (TR1): 0 Number of mixed cystic and solid nodules >/= 1.5 cm not described below ([place]): 0 _________________________________________________________ Nodule # 1: 1.3 x 0.7 x 1.2 cm predominantly cystic nodule in the superior right thyroid lobe does not meet criteria for  FNA or surveillance. _________________________________________________________ Nodule # 2: Prior biopsy: No Location: Right; inferior Maximum size: 4.1 cm; Other 2 dimensions: 3.1 x 2.9 cm, previously, 3.4 x 2.9 x 2.5 cm on 12/18/2016 and 3.4 x 2.4 x 2.5 cm on 04/27/2014. Composition: solid/almost completely solid (2) Echogenicity: isoechoic (1) Shape: taller-than-wide (3) Margins: ill-defined (0) Echogenic foci: none (0) ACR TI-RADS total points: 6. ACR TI-RADS risk category:  TR4 (4-6 points). Significant change in size (>/= 20% in two dimensions and minimal increase of 2 mm): No Change in features: No Change in ACR TI-RADS risk category: No ACR TI-RADS recommendations: **Given size (>/= 1.5 cm) and appearance, fine needle aspiration of this moderately suspicious nodule should be considered based on TI-RADS criteria. _________________________________________________________ Nodule # 3: Prior biopsy: No Location: Right; mid Maximum size: 1.3 cm; Other 2 dimensions: 0.9 x 1.2 cm, previously, 1.6 x 1.5 x 0.6 cm Composition: solid/almost completely solid (2) Echogenicity: isoechoic (1) Shape: not taller-than-wide (0) Margins: ill-defined (0) Echogenic foci: none (0) ACR TI-RADS total points: 3. ACR TI-RADS risk category:  TR3 (3 points). Significant change in size (>/= 20% in two dimensions and minimal increase of 2 mm): No Change in features: No Change in ACR TI-RADS risk category: No ACR TI-RADS recommendations: Given size (<1.4 cm) and appearance, this nodule does NOT meet TI-RADS criteria  for biopsy or dedicated follow-up. _________________________________________________________ Nodule # 4: Prior biopsy: No Location: Left; inferior Maximum size: 3.8 cm; Other 2 dimensions: 2.9 x 2.6 cm, previously, 3.9 x 2.9 x 3.4 cm on 12/18/2016 and 3.6 x 2.5 x 2.7 cm on 04/27/2014. Composition: solid/almost completely solid (2) Echogenicity: isoechoic (1) Shape: taller-than-wide (3) Margins: smooth (0) Echogenic foci: none (0) ACR TI-RADS total points: 6. ACR TI-RADS risk category:  TR4 (4-6 points). Significant change in size (>/= 20% in two dimensions and minimal increase of 2 mm): No Change in features: No Change in ACR TI-RADS risk category: No ACR TI-RADS recommendations: **Given size (>/= 1.5 cm) and appearance, fine needle aspiration of this moderately suspicious nodule should be considered based on TI-RADS criteria. _________________________________________________________ IMPRESSION: Nodule 2 in the inferior right thyroid lobe and nodule 4 in the inferior left thyroid lobe both meet criteria for FNA. Although given that these nodules demonstrate minimal growth since 2015, they are likely benign. The above is in keeping with the ACR TI-RADS recommendations - J Am Coll Radiol 2017;14:587-595. Electronically Signed   By: Miachel Roux M.D.   On: 10/28/2020 11:37       Assessment & Plan:   Problem List Items Addressed This Visit     Essential hypertension, benign    Blood pressure as outlined.  Elevated above desired goal.  Continue lisinopril '20mg'$  q day.  Add amlodipine '5mg'$  q day.  Follow pressures.  Send in readings over the next several weeks.  Check metabolic panel.       Relevant Medications   lisinopril (ZESTRIL) 20 MG tablet   rosuvastatin (CRESTOR) 20 MG tablet   amLODipine (NORVASC) 5 MG tablet   Other Relevant Orders   Basic Metabolic Panel (BMET)   Basic metabolic panel   Hypercholesterolemia - Primary    Continue crestor.  Low cholesterol diet and exercise.  Follow lipid panel  and liver function tests.        Relevant Medications   lisinopril (ZESTRIL) 20 MG tablet   rosuvastatin (CRESTOR) 20 MG tablet   amLODipine (NORVASC) 5 MG tablet   Other Relevant Orders   Lipid Profile   Hepatic  function panel   CBC with Differential/Platelet   Hepatic function panel   Lipid panel   Thyroid nodule    Saw Dr Gabriel Carina.  S/p biopsies - ok.  Felt no further w/up warranted.  Follow.  TSH 02/2021 - wnl.  Recheck today.       Relevant Orders   TSH     Einar Pheasant, MD

## 2021-11-20 NOTE — Assessment & Plan Note (Signed)
Continue crestor.  Low cholesterol diet and exercise. Follow lipid panel and liver function tests.   

## 2021-11-20 NOTE — Assessment & Plan Note (Signed)
Blood pressure as outlined.  Elevated above desired goal.  Continue lisinopril '20mg'$  q day.  Add amlodipine '5mg'$  q day.  Follow pressures.  Send in readings over the next several weeks.  Check metabolic panel.

## 2021-11-20 NOTE — Assessment & Plan Note (Signed)
Saw Dr Solum.  S/p biopsies - ok.  Felt no further w/up warranted.  Follow.  TSH 02/2021 - wnl.  Recheck today.  

## 2021-12-19 ENCOUNTER — Encounter: Payer: Self-pay | Admitting: Internal Medicine

## 2021-12-19 NOTE — Telephone Encounter (Signed)
Please call and confirm he is on lisinopril '20mg'$  q day and amlodipine '5mg'$  q day.  Blood pressures above goal.  I would like to increase lisinopril to '40mg'$  q ay.  Continue amlodipine '5mg'$  q day.  Continue to monitor and send in readings.

## 2021-12-20 ENCOUNTER — Other Ambulatory Visit: Payer: Self-pay

## 2021-12-20 ENCOUNTER — Telehealth: Payer: Self-pay | Admitting: Internal Medicine

## 2021-12-20 MED ORDER — AMLODIPINE BESYLATE 5 MG PO TABS: 5.0000 mg | ORAL_TABLET | Freq: Every day | ORAL | 2 refills | Status: DC

## 2021-12-20 MED ORDER — LISINOPRIL 40 MG PO TABS: 40.0000 mg | ORAL_TABLET | Freq: Every day | ORAL | 3 refills | Status: DC

## 2021-12-20 NOTE — Telephone Encounter (Signed)
Pt advised of blood pressure above goal Agreeable to increase in lisinopril. Rx for '40mg'$  lisinopril and amlodipine '5mg'$  sent to [facility]. Pt will cotinue to monitor and send in readings

## 2021-12-20 NOTE — Telephone Encounter (Signed)
Lm for pt to cb Rx for Amlodipine and lisinopril sent to [facility]

## 2021-12-20 NOTE — Telephone Encounter (Signed)
Pharmacy returning your call [phone]

## 2021-12-20 NOTE — Telephone Encounter (Signed)
S/w pt - advised of readings being above goal.

## 2022-03-21 ENCOUNTER — Encounter: Payer: Self-pay | Admitting: Internal Medicine

## 2022-03-21 ENCOUNTER — Ambulatory Visit (INDEPENDENT_AMBULATORY_CARE_PROVIDER_SITE_OTHER): Payer: BC Managed Care – PPO | Admitting: Internal Medicine

## 2022-03-21 VITALS — BP 128/70 | HR 77 | Temp 98.0°F | Resp 16 | Ht 69.0 in | Wt 229.0 lb

## 2022-03-21 DIAGNOSIS — E7800 Pure hypercholesterolemia, unspecified: Secondary | ICD-10-CM

## 2022-03-21 DIAGNOSIS — E041 Nontoxic single thyroid nodule: Secondary | ICD-10-CM

## 2022-03-21 DIAGNOSIS — Z0000 Encounter for general adult medical examination without abnormal findings: Secondary | ICD-10-CM | POA: Diagnosis not present

## 2022-03-21 DIAGNOSIS — Z125 Encounter for screening for malignant neoplasm of prostate: Secondary | ICD-10-CM | POA: Diagnosis not present

## 2022-03-21 DIAGNOSIS — I10 Essential (primary) hypertension: Secondary | ICD-10-CM

## 2022-03-21 DIAGNOSIS — I4891 Unspecified atrial fibrillation: Secondary | ICD-10-CM

## 2022-03-21 LAB — HEPATIC FUNCTION PANEL
ALT: 17 U/L (ref 0–53)
AST: 14 U/L (ref 0–37)
Albumin: 4.1 g/dL (ref 3.5–5.2)
Alkaline Phosphatase: 80 U/L (ref 39–117)
Bilirubin, Direct: 0.1 mg/dL (ref 0.0–0.3)
Total Bilirubin: 0.5 mg/dL (ref 0.2–1.2)
Total Protein: 6.6 g/dL (ref 6.0–8.3)

## 2022-03-21 LAB — CBC WITH DIFFERENTIAL/PLATELET
Basophils Absolute: 0.1 10*3/uL (ref 0.0–0.1)
Basophils Relative: 1.1 % (ref 0.0–3.0)
Eosinophils Absolute: 0.1 10*3/uL (ref 0.0–0.7)
Eosinophils Relative: 2.9 % (ref 0.0–5.0)
HCT: 45.5 % (ref 39.0–52.0)
Hemoglobin: 15.5 g/dL (ref 13.0–17.0)
Lymphocytes Relative: 27.5 % (ref 12.0–46.0)
Lymphs Abs: 1.4 10*3/uL (ref 0.7–4.0)
MCHC: 34.1 g/dL (ref 30.0–36.0)
MCV: 86.1 fl (ref 78.0–100.0)
Monocytes Absolute: 0.4 10*3/uL (ref 0.1–1.0)
Monocytes Relative: 8.6 % (ref 3.0–12.0)
Neutro Abs: 3 10*3/uL (ref 1.4–7.7)
Neutrophils Relative %: 59.9 % (ref 43.0–77.0)
Platelets: 268 10*3/uL (ref 150.0–400.0)
RBC: 5.29 Mil/uL (ref 4.22–5.81)
RDW: 13.7 % (ref 11.5–15.5)
WBC: 4.9 10*3/uL (ref 4.0–10.5)

## 2022-03-21 LAB — PSA: PSA: 0.6 ng/mL (ref 0.10–4.00)

## 2022-03-21 LAB — BASIC METABOLIC PANEL
BUN: 11 mg/dL (ref 6–23)
CO2: 29 mEq/L (ref 19–32)
Calcium: 10 mg/dL (ref 8.4–10.5)
Chloride: 107 mEq/L (ref 96–112)
Creatinine, Ser: 1.01 mg/dL (ref 0.40–1.50)
GFR: 82.39 mL/min (ref >60.00)
Glucose, Bld: 98 mg/dL (ref 70–99)
Potassium: 3.9 mEq/L (ref 3.5–5.1)
Sodium: 141 mEq/L (ref 135–145)

## 2022-03-21 LAB — LIPID PANEL
Cholesterol: 128 mg/dL (ref 0–200)
HDL: 48.1 mg/dL (ref >39.00)
LDL Cholesterol: 65 mg/dL (ref 0–99)
NonHDL: 79.47
Total CHOL/HDL Ratio: 3
Triglycerides: 72 mg/dL (ref 0.0–149.0)
VLDL: 14.4 mg/dL (ref 0.0–40.0)

## 2022-03-21 LAB — TSH: TSH: 0.74 u[IU]/mL (ref 0.35–5.50)

## 2022-03-21 MED ORDER — APIXABAN 5 MG PO TABS
5.0000 mg | ORAL_TABLET | Freq: Two times a day (BID) | ORAL | 2 refills | Status: DC
Start: 2022-03-21 — End: 2022-06-19

## 2022-03-21 NOTE — Assessment & Plan Note (Signed)
Physical today 03/21/22.   Colonoscopy 03/2015. - diverticulosis.  IFOB 07/19/21 - negative. Check psa.

## 2022-03-21 NOTE — Progress Notes (Unsigned)
Patient ID: [name], male   DOB: 06-Jul-1965, 56 y.o.   MRN: [id number]   Subjective:    Patient ID: [name], male    DOB: 1965-12-31, 56 y.o.   MRN: [id number]   Patient here for  Chief Complaint  Patient presents with   Annual Exam    CPE   .   HPI Here for physical exam. Recently increased lisinopril to '40mg'$  q day.  Also on amlodipine.  Blood pressure is doing better.  Tries to stay active.  No chest pain or sob reported.  No increased heart rate or palpitations.  On questioning, will notice (occasionally in the evening) his heart beating more.  Still denies pain or sob.  No abdominal pain.  Bowels moving.     Past Medical History:  Diagnosis Date   History of chicken pox    Hypercholesterolemia    Hypertension    Past Surgical History:  Procedure Laterality Date   TUMOR REMOVAL  7/08 & 5/14   left shoulder, lipoma   Family History  Problem Relation Age of Onset   Hyperlipidemia Mother    Hypertension Mother    Hyperlipidemia Father    Kidney cancer Father    Hyperlipidemia Paternal Aunt    Colon cancer Paternal Uncle    Prostate cancer Paternal Uncle    Hyperlipidemia Paternal Uncle    Hyperlipidemia Paternal Grandmother    Hyperlipidemia Paternal Grandfather    Social History   Socioeconomic History   Marital status: Married    Spouse name: Not on file   Number of children: Not on file   Years of education: Not on file   Highest education level: Not on file  Occupational History   Not on file  Tobacco Use   Smoking status: Never   Smokeless tobacco: Never  Substance and Sexual Activity   Alcohol use: Yes    Alcohol/week: 0.0 standard drinks of alcohol    Comment: rarley   Drug use: No   Sexual activity: Not on file  Other Topics Concern   Not on file  Social History Narrative   Not on file   Social Determinants of Health   Financial Resource Strain: Not on file  Food Insecurity: Not on file  Transportation Needs:  Not on file  Physical Activity: Not on file  Stress: Not on file  Social Connections: Not on file     Review of Systems  Constitutional:  Negative for appetite change and unexpected weight change.  HENT:  Negative for congestion, sinus pressure and sore throat.   Eyes:  Negative for pain and visual disturbance.  Respiratory:  Negative for cough, chest tightness and shortness of breath.   Cardiovascular:  Negative for chest pain, palpitations and leg swelling.  Gastrointestinal:  Negative for abdominal pain, diarrhea, nausea and vomiting.  Genitourinary:  Negative for difficulty urinating and dysuria.  Musculoskeletal:  Negative for joint swelling and myalgias.  Skin:  Negative for color change and rash.  Neurological:  Negative for dizziness, light-headedness and headaches.  Hematological:  Negative for adenopathy. Does not bruise/bleed easily.  Psychiatric/Behavioral:  Negative for agitation and dysphoric mood.        Objective:     BP 128/70 (BP Location: Left Arm, Patient Position: Sitting, Cuff Size: Large)   Pulse 77   Temp 98 F (36.7 C) (Temporal)   Resp 16   Ht '5\' 9"'$  (1.753 m)   Wt 229 lb (103.9 kg)   SpO2 98%  BMI 33.82 kg/m  Wt Readings from Last 3 Encounters:  03/21/22 229 lb (103.9 kg)  11/20/21 229 lb 6.4 oz (104.1 kg)  07/19/21 229 lb (103.9 kg)    Physical Exam Constitutional:      General: He is not in acute distress.    Appearance: Normal appearance. He is well-developed.  HENT:     Head: Normocephalic and atraumatic.     Right Ear: External ear normal.     Left Ear: External ear normal.  Eyes:     General: No scleral icterus.       Right eye: No discharge.        Left eye: No discharge.     Conjunctiva/sclera: Conjunctivae normal.  Neck:     Thyroid: No thyromegaly.  Cardiovascular:     Rate and Rhythm: Normal rate. Rhythm irregular.  Pulmonary:     Effort: No respiratory distress.     Breath sounds: Normal breath sounds. No wheezing.   Abdominal:     General: Bowel sounds are normal.     Palpations: Abdomen is soft.     Tenderness: There is no abdominal tenderness.  Musculoskeletal:        General: No swelling or tenderness.     Cervical back: Neck supple. No tenderness.  Lymphadenopathy:     Cervical: No cervical adenopathy.  Skin:    Findings: No erythema or rash.  Neurological:     Mental Status: He is alert and oriented to person, place, and time.  Psychiatric:        Mood and Affect: Mood normal.        Behavior: Behavior normal.      Outpatient Encounter Medications as of 03/21/2022  Medication Sig   amLODipine (NORVASC) 5 MG tablet Take 1 tablet (5 mg total) by mouth daily.   apixaban (ELIQUIS) 5 MG TABS tablet Take 1 tablet (5 mg total) by mouth 2 (two) times daily.   aspirin 81 MG tablet Take 81 mg by mouth daily.   lisinopril (ZESTRIL) 40 MG tablet Take 1 tablet (40 mg total) by mouth daily.   rosuvastatin (CRESTOR) 20 MG tablet Take 1 tablet (20 mg total) by mouth daily.   No facility-administered encounter medications on file as of 03/21/2022.     Lab Results  Component Value Date   WBC 4.9 03/21/2022   HGB 15.5 03/21/2022   HCT 45.5 03/21/2022   PLT 268.0 03/21/2022   GLUCOSE 98 03/21/2022   CHOL 128 03/21/2022   TRIG 72.0 03/21/2022   HDL 48.10 03/21/2022   LDLDIRECT 166.3 05/08/2013   LDLCALC 65 03/21/2022   ALT 17 03/21/2022   AST 14 03/21/2022   NA 141 03/21/2022   K 3.9 03/21/2022   CL 107 03/21/2022   CREATININE 1.01 03/21/2022   BUN 11 03/21/2022   CO2 29 03/21/2022   TSH 0.74 03/21/2022   PSA 0.60 03/21/2022   HGBA1C 5.6 12/05/2016    US THYROID  Result Date: 10/28/2020 CLINICAL DATA:  Thyroid fullness Thyroid nodule follow-up EXAM: THYROID ULTRASOUND TECHNIQUE: Ultrasound examination of the thyroid gland and adjacent soft tissues was performed. COMPARISON:  12/18/2016 FINDINGS: Parenchymal Echotexture: Mildly heterogeneous Isthmus: 0.8 cm Right lobe: 6.3 x 3.8 x 3.3  cm Left lobe: 6.5 x 3.3 x 3.2 cm _________________________________________________________ Estimated total number of nodules >/= 1 cm: 4 Number of spongiform nodules >/=  2 cm not described below (TR1): 0 Number of mixed cystic and solid nodules >/= 1.5 cm not described below ([place]):  0 _________________________________________________________ Nodule # 1: 1.3 x 0.7 x 1.2 cm predominantly cystic nodule in the superior right thyroid lobe does not meet criteria for FNA or surveillance. _________________________________________________________ Nodule # 2: Prior biopsy: No Location: Right; inferior Maximum size: 4.1 cm; Other 2 dimensions: 3.1 x 2.9 cm, previously, 3.4 x 2.9 x 2.5 cm on 12/18/2016 and 3.4 x 2.4 x 2.5 cm on 04/27/2014. Composition: solid/almost completely solid (2) Echogenicity: isoechoic (1) Shape: taller-than-wide (3) Margins: ill-defined (0) Echogenic foci: none (0) ACR TI-RADS total points: 6. ACR TI-RADS risk category:  TR4 (4-6 points). Significant change in size (>/= 20% in two dimensions and minimal increase of 2 mm): No Change in features: No Change in ACR TI-RADS risk category: No ACR TI-RADS recommendations: **Given size (>/= 1.5 cm) and appearance, fine needle aspiration of this moderately suspicious nodule should be considered based on TI-RADS criteria. _________________________________________________________ Nodule # 3: Prior biopsy: No Location: Right; mid Maximum size: 1.3 cm; Other 2 dimensions: 0.9 x 1.2 cm, previously, 1.6 x 1.5 x 0.6 cm Composition: solid/almost completely solid (2) Echogenicity: isoechoic (1) Shape: not taller-than-wide (0) Margins: ill-defined (0) Echogenic foci: none (0) ACR TI-RADS total points: 3. ACR TI-RADS risk category:  TR3 (3 points). Significant change in size (>/= 20% in two dimensions and minimal increase of 2 mm): No Change in features: No Change in ACR TI-RADS risk category: No ACR TI-RADS recommendations: Given size (<1.4 cm) and appearance, this nodule  does NOT meet TI-RADS criteria for biopsy or dedicated follow-up. _________________________________________________________ Nodule # 4: Prior biopsy: No Location: Left; inferior Maximum size: 3.8 cm; Other 2 dimensions: 2.9 x 2.6 cm, previously, 3.9 x 2.9 x 3.4 cm on 12/18/2016 and 3.6 x 2.5 x 2.7 cm on 04/27/2014. Composition: solid/almost completely solid (2) Echogenicity: isoechoic (1) Shape: taller-than-wide (3) Margins: smooth (0) Echogenic foci: none (0) ACR TI-RADS total points: 6. ACR TI-RADS risk category:  TR4 (4-6 points). Significant change in size (>/= 20% in two dimensions and minimal increase of 2 mm): No Change in features: No Change in ACR TI-RADS risk category: No ACR TI-RADS recommendations: **Given size (>/= 1.5 cm) and appearance, fine needle aspiration of this moderately suspicious nodule should be considered based on TI-RADS criteria. _________________________________________________________ IMPRESSION: Nodule 2 in the inferior right thyroid lobe and nodule 4 in the inferior left thyroid lobe both meet criteria for FNA. Although given that these nodules demonstrate minimal growth since 2015, they are likely benign. The above is in keeping with the ACR TI-RADS recommendations - J Am Coll Radiol 2017;14:587-595. Electronically Signed   By: Miachel Roux M.D.   On: 10/28/2020 11:37       Assessment & Plan:   Problem List Items Addressed This Visit     Atrial fibrillation ([place])    Noticed irregularity on exam.  EKG - Afib with controlled ventricular rate 90.  Discussed afib and need for further w/up and evaluation.  Check labs, including cbc, tsh and metabolic panel.  Discussed referral to cardiology for further w/up - echo, etc. Start eliquis.  Referral to cardiology.        Relevant Medications   apixaban (ELIQUIS) 5 MG TABS tablet   Other Relevant Orders   Ambulatory referral to Cardiology   Essential hypertension, benign    Blood pressure as outlined.  Overall doing better.  Continue lisinopril '40mg'$  q day and amlodipine '5mg'$  q day.  Follow pressures.  Follow metabolic panel.       Relevant Medications   apixaban (ELIQUIS) 5  MG TABS tablet   Other Relevant Orders   EKG 12-Lead (Completed)   Health care maintenance    Physical today 03/21/22.   Colonoscopy 03/2015. - diverticulosis.  IFOB 07/19/21 - negative. Check psa.       Hypercholesterolemia    Continue crestor.  Low cholesterol diet and exercise.  Follow lipid panel and liver function tests.        Relevant Medications   apixaban (ELIQUIS) 5 MG TABS tablet   Thyroid nodule    Saw Dr Gabriel Carina.  S/p biopsies - ok.  Felt no further w/up warranted.  Follow.  TSH 02/2021 - wnl.  Recheck today.       Other Visit Diagnoses     Routine general medical examination at a health care facility    -  Primary   Prostate cancer screening       Relevant Orders   PSA (Completed)        Einar Pheasant, MD

## 2022-03-22 ENCOUNTER — Encounter: Payer: Self-pay | Admitting: Internal Medicine

## 2022-03-22 DIAGNOSIS — I4891 Unspecified atrial fibrillation: Secondary | ICD-10-CM | POA: Insufficient documentation

## 2022-03-22 NOTE — Assessment & Plan Note (Signed)
Blood pressure as outlined.  Overall doing better. Continue lisinopril '40mg'$  q day and amlodipine '5mg'$  q day.  Follow pressures.  Follow metabolic panel.

## 2022-03-22 NOTE — Assessment & Plan Note (Signed)
Noticed irregularity on exam.  EKG - Afib with controlled ventricular rate 90.  Discussed afib and need for further w/up and evaluation.  Check labs, including cbc, tsh and metabolic panel.  Discussed referral to cardiology for further w/up - echo, etc. Start eliquis.  Referral to cardiology.

## 2022-03-22 NOTE — Assessment & Plan Note (Signed)
Saw Dr Gabriel Carina.  S/p biopsies - ok.  Felt no further w/up warranted.  Follow.  TSH 02/2021 - wnl.  Recheck today.

## 2022-03-22 NOTE — Assessment & Plan Note (Signed)
Continue crestor.  Low cholesterol diet and exercise. Follow lipid panel and liver function tests.   

## 2022-05-17 ENCOUNTER — Encounter: Payer: Self-pay | Admitting: Cardiology

## 2022-05-17 ENCOUNTER — Other Ambulatory Visit
Admission: RE | Admit: 2022-05-17 | Discharge: 2022-05-17 | Disposition: A | Discharge status: Home / Self Care | Payer: BC Managed Care – PPO | Source: Ambulatory Visit | Attending: Cardiology | Admitting: Cardiology

## 2022-05-17 ENCOUNTER — Ambulatory Visit: Payer: BC Managed Care – PPO | Attending: Cardiology | Admitting: Cardiology

## 2022-05-17 VITALS — BP 132/88 | HR 97 | Ht 69.0 in | Wt 230.0 lb

## 2022-05-17 DIAGNOSIS — I4819 Other persistent atrial fibrillation: Secondary | ICD-10-CM | POA: Diagnosis not present

## 2022-05-17 DIAGNOSIS — E7800 Pure hypercholesterolemia, unspecified: Secondary | ICD-10-CM

## 2022-05-17 DIAGNOSIS — I10 Essential (primary) hypertension: Secondary | ICD-10-CM

## 2022-05-17 LAB — CBC
HCT: 49.3 % (ref 39.0–52.0)
Hemoglobin: 16.5 g/dL (ref 13.0–17.0)
MCH: 28.9 pg (ref 26.0–34.0)
MCHC: 33.5 g/dL (ref 30.0–36.0)
MCV: 86.5 fL (ref 80.0–100.0)
Platelets: 289 10*3/uL (ref 150–400)
RBC: 5.7 MIL/uL (ref 4.22–5.81)
RDW: 12.7 % (ref 11.5–15.5)
WBC: 5.9 10*3/uL (ref 4.0–10.5)
nRBC: 0 % (ref 0.0–0.2)

## 2022-05-17 LAB — BASIC METABOLIC PANEL
Anion gap: 7 (ref 5–15)
BUN: 8 mg/dL (ref 6–20)
CO2: 27 mmol/L (ref 22–32)
Calcium: 9.9 mg/dL (ref 8.9–10.3)
Chloride: 105 mmol/L (ref 98–111)
Creatinine, Ser: 1.04 mg/dL (ref 0.61–1.24)
GFR, Estimated: >60 mL/min (ref >60)
Glucose, Bld: 101 mg/dL — ABNORMAL HIGH (ref 70–99)
Potassium: 4 mmol/L (ref 3.5–5.1)
Sodium: 139 mmol/L (ref 135–145)

## 2022-05-17 MED ORDER — DILTIAZEM HCL ER COATED BEADS 120 MG PO CP24: 120.0000 mg | ORAL_CAPSULE | Freq: Every day | ORAL | 3 refills | Status: DC

## 2022-05-17 NOTE — Progress Notes (Signed)
Cardiology Office Note:    Date:  05/17/2022   ID:  Samuel Last., DOB May 12, 1965, MRN [id number]  PCP:  Einar Pheasant, [place] Prov[facility] Cardiologist:  Kate Sable, MD     Referring MD: Einar Pheasant, MD   Chief Complaint  Patient presents with   New Patient (Initial Visit)    Afib, Family Hx    Samuel Cornforth. is a 57 y.o. male who is being seen today for the evaluation of atrial fibrillation at the request of Einar Pheasant, MD.   History of Present Illness:    Samuel Nicol. is a 57 y.o. male with a hx of hypertension, hyperlipidemia, persistent A-fib who presented for A-fib.  Patient saw primary care physician 02/2022, rhythm was irregular on exam.  EKG showed atrial fibrillation with controlled ventricular response.  Patient was started on Eliquis for stroke prophylaxis.  He denies chest pain, shortness of breath, palpitations or syncope.  Compliant with Eliquis, no bleeding issues.  Past Medical History:  Diagnosis Date   History of chicken pox    Hypercholesterolemia    Hypertension     Past Surgical History:  Procedure Laterality Date   TUMOR REMOVAL  7/08 & 5/14   left shoulder, lipoma    Current Medications: Current Meds  Medication Sig   apixaban (ELIQUIS) 5 MG TABS tablet Take 1 tablet (5 mg total) by mouth 2 (two) times daily.   diltiazem (CARDIZEM CD) 120 MG 24 hr capsule Take 1 capsule (120 mg total) by mouth daily.   lisinopril (ZESTRIL) 40 MG tablet Take 1 tablet (40 mg total) by mouth daily.   rosuvastatin (CRESTOR) 20 MG tablet Take 1 tablet (20 mg total) by mouth daily.   [DISCONTINUED] amLODipine (NORVASC) 5 MG tablet Take 1 tablet (5 mg total) by mouth daily.     Allergies:   Patient has no known allergies.   Social History   Socioeconomic History   Marital status: Married    Spouse name: Not on file   Number of children: Not on file   Years of education: Not on file    Highest education level: Not on file  Occupational History   Not on file  Tobacco Use   Smoking status: Never    Passive exposure: Never   Smokeless tobacco: Never  Substance and Sexual Activity   Alcohol use: Yes    Alcohol/week: 0.0 standard drinks of alcohol    Comment: rarley   Drug use: No   Sexual activity: Not on file  Other Topics Concern   Not on file  Social History Narrative   Not on file   Social Determinants of Health   Financial Resource Strain: Not on file  Food Insecurity: Not on file  Transportation Needs: Not on file  Physical Activity: Not on file  Stress: Not on file  Social Connections: Not on file     Family History: The patient's family history includes Colon cancer in his paternal uncle; Heart Problems in his father; Hyperlipidemia in his father, mother, paternal aunt, paternal grandfather, paternal grandmother, and paternal uncle; Hypertension in his mother; Kidney cancer in his father; Prostate cancer in his paternal uncle.  ROS:   Please see the history of present illness.     All other systems reviewed and are negative.  EKGs/Labs/Other Studies Reviewed:    The following studies were reviewed today:   EKG:  EKG is  ordered today.  The ekg ordered today  demonstrates atrial fibrillation, heart rate 97  Recent Labs: 03/21/2022: ALT 17; BUN 11; Creatinine, Ser 1.01; Hemoglobin 15.5; Platelets 268.0; Potassium 3.9; Sodium 141; TSH 0.74  Recent Lipid Panel    Component Value Date/Time   CHOL 128 03/21/2022 1042   TRIG 72.0 03/21/2022 1042   HDL 48.10 03/21/2022 1042   CHOLHDL 3 03/21/2022 1042   VLDL 14.4 03/21/2022 1042   LDLCALC 65 03/21/2022 1042   LDLDIRECT 166.3 05/08/2013 1006     Risk Assessment/Calculations:             Physical Exam:    VS:  BP 132/88 (BP Location: Right Arm, Patient Position: Sitting, Cuff Size: Normal)   Pulse 97   Ht '5\' 9"'$  (1.753 m)   Wt 230 lb (104.3 kg)   SpO2 97%   BMI 33.97 kg/m     Wt  Readings from Last 3 Encounters:  05/17/22 230 lb (104.3 kg)  03/21/22 229 lb (103.9 kg)  11/20/21 229 lb 6.4 oz (104.1 kg)     GEN:  Well nourished, well developed in no acute distress HEENT: Normal NECK: No JVD; No carotid bruits CARDIAC: RRR, no murmurs, rubs, gallops RESPIRATORY:  Clear to auscultation without rales, wheezing or rhonchi  ABDOMEN: Soft, non-tender, non-distended MUSCULOSKELETAL:  No edema; No deformity  SKIN: Warm and dry NEUROLOGIC:  Alert and oriented x 3 PSYCHIATRIC:  Normal affect   ASSESSMENT:    1. Persistent atrial fibrillation ([place])   2. Primary hypertension   3. Pure hypercholesterolemia    PLAN:    In order of problems listed above:  Persistent atrial fibrillation, clinically asymptomatic.  Heart rate 97.  Stop Norvasc, start Cardizem CD 120 mg daily.  Continue Eliquis.  Obtain echocardiogram.  Has taken Eliquis uninterrupted over 6 weeks.  Schedule DC cardioversion.  Refer to A-fib clinic. Hypertension, lisinopril, Cardizem. Hyperlipidemia, cholesterol controlled.  Continue Crestor 20 mg daily.  Follow-up after echo.     Shared Decision Making/Informed Consent The risks (stroke, cardiac arrhythmias rarely resulting in the need for a temporary or permanent pacemaker, skin irritation or burns and complications associated with conscious sedation including aspiration, arrhythmia, respiratory failure and death), benefits (restoration of normal sinus rhythm) and alternatives of a direct current cardioversion were explained in detail to Samuel Elliott and he agrees to proceed.      Medication Adjustments/Labs and Tests Ordered: Current medicines are reviewed at length with the patient today.  Concerns regarding medicines are outlined above.  Orders Placed This Encounter  Procedures   CBC   Basic Metabolic Panel (BMET)   Ambulatory referral to Cardiac Electrophysiology   EKG 12-Lead   ECHOCARDIOGRAM COMPLETE   Meds ordered this encounter   Medications   diltiazem (CARDIZEM CD) 120 MG 24 hr capsule    Sig: Take 1 capsule (120 mg total) by mouth daily.    Dispense:  90 capsule    Refill:  3    Patient Instructions  Medication Instructions:   STOP Amlodipine  START Cardizem - take one tablet ('120mg'$ ) by mouth daily.   *If you need a refill on your cardiac medications before your next appointment, please call your pharmacy*   Lab Work:  Your physician recommends you go to the medical mall to have lab work completed  If you have labs (blood work) drawn today and your tests are completely normal, you will receive your results only by: [place] (if you have MyChart) OR A paper copy in the mail If you have any  lab test that is abnormal or we need to change your treatment, we will call you to review the results.   Testing/Procedures:  Echocardiogram   Your physician has requested that you have an echocardiogram. Echocardiography is a painless test that uses sound waves to create images of your heart. It provides your doctor with information about the size and shape of your heart and how well your heart's chambers and valves are working. This procedure takes approximately one hour. There are no restrictions for this procedure. Please note; depending on visual quality an IV may need to be placed.   2. You are scheduled for a Cardioversion on ___01/24/2024__________ with Dr._Agbor-Etang_______ Please arrive at the [place] of [facility] at __6:30_______ a.m. on the day of your procedure.  DIET INSTRUCTIONS:  Nothing to eat or drink after midnight except your medications with a safe sip of water.         Labs: _____will be done today_____________  Medications:  YOU MAY TAKE ALL of your remaining medications with a small amount of water.  Must have a responsible person to drive you home.  Bring a current list of your medications and current insurance cards.    If you have any questions after you get home,  please call the office at [phone]   Follow-Up: At [facility], you and your health needs are our priority.  As part of our continuing mission to provide you with exceptional heart care, we have created designated Provider Care Teams.  These Care Teams include your primary Cardiologist (physician) and Advanced Practice Providers (APPs -  Physician Assistants and Nurse Practitioners) who all work together to provide you with the care you need, when you need it.  We recommend signing up for the patient portal called "MyChart".  Sign up information is provided on this After Visit Summary.  MyChart is used to connect with patients for Virtual Visits (Telemedicine).  Patients are able to view lab/test results, encounter notes, upcoming appointments, etc.  Non-urgent messages can be sent to your provider as well.   To learn more about what you can do with MyChart, go to [email or web address].    Your next appointment:   4 month(s)  Provider:   You may see Kate Sable, MD or one of the following Advanced Practice Providers on your designated Care Team:   Murray Hodgkins, NP Christell Faith, PA-C Cadence Kathlen Mody, PA-C Gerrie Nordmann, NP    Signed, Kate Sable, MD  05/17/2022 9:41 AM    [place]

## 2022-05-17 NOTE — Patient Instructions (Signed)
Medication Instructions:   STOP Amlodipine  START Cardizem - take one tablet ('120mg'$ ) by mouth daily.   *If you need a refill on your cardiac medications before your next appointment, please call your pharmacy*   Lab Work:  Your physician recommends you go to the medical mall to have lab work completed  If you have labs (blood work) drawn today and your tests are completely normal, you will receive your results only by: MyChart Message (if you have MyChart) OR A paper copy in the mail If you have any lab test that is abnormal or we need to change your treatment, we will call you to review the results.   Testing/Procedures:  Echocardiogram   Your physician has requested that you have an echocardiogram. Echocardiography is a painless test that uses sound waves to create images of your heart. It provides your doctor with information about the size and shape of your heart and how well your heart's chambers and valves are working. This procedure takes approximately one hour. There are no restrictions for this procedure. Please note; depending on visual quality an IV may need to be placed.   2. You are scheduled for a Cardioversion on ___01/24/2024__________ with Dr._Agbor-Etang_______ Please arrive at the [place] [facility] at __6:30_______ a.m. on the day of your procedure.  DIET INSTRUCTIONS:  Nothing to eat or drink after midnight except your medications with a safe sip of water.         Labs: _____will be done today_____________  Medications:  YOU MAY TAKE ALL of your remaining medications with a small amount of water.  Must have a responsible person to drive you home.  Bring a current list of your medications and current insurance cards.    If you have any questions after you get home, please call the office at [phone]   Follow-Up: At [facility], you and your health needs are our priority.  As part of our continuing mission to provide you with exceptional  heart care, we have created designated Provider Care Teams.  These Care Teams include your primary Cardiologist (physician) and Advanced Practice Providers (APPs -  Physician Assistants and Nurse Practitioners) who all work together to provide you with the care you need, when you need it.  We recommend signing up for the patient portal called "MyChart".  Sign up information is provided on this After Visit Summary.  MyChart is used to connect with patients for Virtual Visits (Telemedicine).  Patients are able to view lab/test results, encounter notes, upcoming appointments, etc.  Non-urgent messages can be sent to your provider as well.   To learn more about what you can do with MyChart, go to [email or web address].    Your next appointment:   4 month(s)  Provider:   You may see Kate Sable, MD or one of the following Advanced Practice Providers on your designated Care Team:   Murray Hodgkins, NP Christell Faith, PA-C Cadence Kathlen Mody, PA-C Gerrie Nordmann, NP

## 2022-05-17 NOTE — H&P (View-Only) (Signed)
Cardiology Office Note:    Date:  05/17/2022   ID:  Samuel Last., DOB 1965/07/23, MRN [id number]  PCP:  Einar Pheasant, [place] Prov[facility] Cardiologist:  Kate Sable, MD     Referring MD: Einar Pheasant, MD   Chief Complaint  Patient presents with   New Patient (Initial Visit)    Afib, Family Hx    Samuel Elliott. is a 57 y.o. male who is being seen today for the evaluation of atrial fibrillation at the request of Einar Pheasant, MD.   History of Present Illness:    Samuel Stirewalt. is a 57 y.o. male with a hx of hypertension, hyperlipidemia, persistent A-fib who presented for A-fib.  Patient saw primary care physician 02/2022, rhythm was irregular on exam.  EKG showed atrial fibrillation with controlled ventricular response.  Patient was started on Eliquis for stroke prophylaxis.  He denies chest pain, shortness of breath, palpitations or syncope.  Compliant with Eliquis, no bleeding issues.  Past Medical History:  Diagnosis Date   History of chicken pox    Hypercholesterolemia    Hypertension     Past Surgical History:  Procedure Laterality Date   TUMOR REMOVAL  7/08 & 5/14   left shoulder, lipoma    Current Medications: Current Meds  Medication Sig   apixaban (ELIQUIS) 5 MG TABS tablet Take 1 tablet (5 mg total) by mouth 2 (two) times daily.   diltiazem (CARDIZEM CD) 120 MG 24 hr capsule Take 1 capsule (120 mg total) by mouth daily.   lisinopril (ZESTRIL) 40 MG tablet Take 1 tablet (40 mg total) by mouth daily.   rosuvastatin (CRESTOR) 20 MG tablet Take 1 tablet (20 mg total) by mouth daily.   [DISCONTINUED] amLODipine (NORVASC) 5 MG tablet Take 1 tablet (5 mg total) by mouth daily.     Allergies:   Patient has no known allergies.   Social History   Socioeconomic History   Marital status: Married    Spouse name: Not on file   Number of children: Not on file   Years of education: Not on file    Highest education level: Not on file  Occupational History   Not on file  Tobacco Use   Smoking status: Never    Passive exposure: Never   Smokeless tobacco: Never  Substance and Sexual Activity   Alcohol use: Yes    Alcohol/week: 0.0 standard drinks of alcohol    Comment: rarley   Drug use: No   Sexual activity: Not on file  Other Topics Concern   Not on file  Social History Narrative   Not on file   Social Determinants of Health   Financial Resource Strain: Not on file  Food Insecurity: Not on file  Transportation Needs: Not on file  Physical Activity: Not on file  Stress: Not on file  Social Connections: Not on file     Family History: The patient's family history includes Colon cancer in his paternal uncle; Heart Problems in his father; Hyperlipidemia in his father, mother, paternal aunt, paternal grandfather, paternal grandmother, and paternal uncle; Hypertension in his mother; Kidney cancer in his father; Prostate cancer in his paternal uncle.  ROS:   Please see the history of present illness.     All other systems reviewed and are negative.  EKGs/Labs/Other Studies Reviewed:    The following studies were reviewed today:   EKG:  EKG is  ordered today.  The ekg ordered today  demonstrates atrial fibrillation, heart rate 97  Recent Labs: 03/21/2022: ALT 17; BUN 11; Creatinine, Ser 1.01; Hemoglobin 15.5; Platelets 268.0; Potassium 3.9; Sodium 141; TSH 0.74  Recent Lipid Panel    Component Value Date/Time   CHOL 128 03/21/2022 1042   TRIG 72.0 03/21/2022 1042   HDL 48.10 03/21/2022 1042   CHOLHDL 3 03/21/2022 1042   VLDL 14.4 03/21/2022 1042   LDLCALC 65 03/21/2022 1042   LDLDIRECT 166.3 05/08/2013 1006     Risk Assessment/Calculations:             Physical Exam:    VS:  BP 132/88 (BP Location: Right Arm, Patient Position: Sitting, Cuff Size: Normal)   Pulse 97   Ht '5\' 9"'$  (1.753 m)   Wt 230 lb (104.3 kg)   SpO2 97%   BMI 33.97 kg/m     Wt  Readings from Last 3 Encounters:  05/17/22 230 lb (104.3 kg)  03/21/22 229 lb (103.9 kg)  11/20/21 229 lb 6.4 oz (104.1 kg)     GEN:  Well nourished, well developed in no acute distress HEENT: Normal NECK: No JVD; No carotid bruits CARDIAC: RRR, no murmurs, rubs, gallops RESPIRATORY:  Clear to auscultation without rales, wheezing or rhonchi  ABDOMEN: Soft, non-tender, non-distended MUSCULOSKELETAL:  No edema; No deformity  SKIN: Warm and dry NEUROLOGIC:  Alert and oriented x 3 PSYCHIATRIC:  Normal affect   ASSESSMENT:    1. Persistent atrial fibrillation ([place])   2. Primary hypertension   3. Pure hypercholesterolemia    PLAN:    In order of problems listed above:  Persistent atrial fibrillation, clinically asymptomatic.  Heart rate 97.  Stop Norvasc, start Cardizem CD 120 mg daily.  Continue Eliquis.  Obtain echocardiogram.  Has taken Eliquis uninterrupted over 6 weeks.  Schedule DC cardioversion.  Refer to A-fib clinic. Hypertension, lisinopril, Cardizem. Hyperlipidemia, cholesterol controlled.  Continue Crestor 20 mg daily.  Follow-up after echo.     Shared Decision Making/Informed Consent The risks (stroke, cardiac arrhythmias rarely resulting in the need for a temporary or permanent pacemaker, skin irritation or burns and complications associated with conscious sedation including aspiration, arrhythmia, respiratory failure and death), benefits (restoration of normal sinus rhythm) and alternatives of a direct current cardioversion were explained in detail to [name] and he agrees to proceed.      Medication Adjustments/Labs and Tests Ordered: Current medicines are reviewed at length with the patient today.  Concerns regarding medicines are outlined above.  Orders Placed This Encounter  Procedures   CBC   Basic Metabolic Panel (BMET)   Ambulatory referral to Cardiac Electrophysiology   EKG 12-Lead   ECHOCARDIOGRAM COMPLETE   Meds ordered this encounter   Medications   diltiazem (CARDIZEM CD) 120 MG 24 hr capsule    Sig: Take 1 capsule (120 mg total) by mouth daily.    Dispense:  90 capsule    Refill:  3    Patient Instructions  Medication Instructions:   STOP Amlodipine  START Cardizem - take one tablet ('120mg'$ ) by mouth daily.   *If you need a refill on your cardiac medications before your next appointment, please call your pharmacy*   Lab Work:  Your physician recommends you go to the medical mall to have lab work completed  If you have labs (blood work) drawn today and your tests are completely normal, you will receive your results only by: [place] (if you have MyChart) OR A paper copy in the mail If you have any  lab test that is abnormal or we need to change your treatment, we will call you to review the results.   Testing/Procedures:  Echocardiogram   Your physician has requested that you have an echocardiogram. Echocardiography is a painless test that uses sound waves to create images of your heart. It provides your doctor with information about the size and shape of your heart and how well your heart's chambers and valves are working. This procedure takes approximately one hour. There are no restrictions for this procedure. Please note; depending on visual quality an IV may need to be placed.   2. You are scheduled for a Cardioversion on ___01/24/2024__________ with Dr._Agbor-Etang_______ Please arrive at the [place] [facility] at __6:30_______ a.m. on the day of your procedure.  DIET INSTRUCTIONS:  Nothing to eat or drink after midnight except your medications with a safe sip of water.         Labs: _____will be done today_____________  Medications:  YOU MAY TAKE ALL of your remaining medications with a small amount of water.  Must have a responsible person to drive you home.  Bring a current list of your medications and current insurance cards.    If you have any questions after you get home,  please call the office at [phone]   Follow-Up: At [facility], you and your health needs are our priority.  As part of our continuing mission to provide you with exceptional heart care, we have created designated Provider Care Teams.  These Care Teams include your primary Cardiologist (physician) and Advanced Practice Providers (APPs -  Physician Assistants and Nurse Practitioners) who all work together to provide you with the care you need, when you need it.  We recommend signing up for the patient portal called "MyChart".  Sign up information is provided on this After Visit Summary.  MyChart is used to connect with patients for Virtual Visits (Telemedicine).  Patients are able to view lab/test results, encounter notes, upcoming appointments, etc.  Non-urgent messages can be sent to your provider as well.   To learn more about what you can do with MyChart, go to [email or web address].    Your next appointment:   4 month(s)  Provider:   You may see Kate Sable, MD or one of the following Advanced Practice Providers on your designated Care Team:   Murray Hodgkins, NP Christell Faith, PA-C Cadence Kathlen Mody, PA-C Gerrie Nordmann, NP    Signed, Kate Sable, MD  05/17/2022 9:41 AM    [place]

## 2022-05-23 ENCOUNTER — Encounter
Admission: RE | Disposition: A | Discharge status: Home / Self Care | Payer: Self-pay | Source: Home / Self Care | Attending: Cardiology

## 2022-05-23 ENCOUNTER — Ambulatory Visit: Payer: BC Managed Care – PPO | Admitting: Registered Nurse

## 2022-05-23 ENCOUNTER — Encounter: Payer: Self-pay | Admitting: Cardiology

## 2022-05-23 ENCOUNTER — Other Ambulatory Visit: Payer: Self-pay

## 2022-05-23 ENCOUNTER — Ambulatory Visit
Admission: RE | Admit: 2022-05-23 | Discharge: 2022-05-23 | Disposition: A | Discharge status: Home / Self Care | Payer: BC Managed Care – PPO | Attending: Cardiology | Admitting: Cardiology

## 2022-05-23 DIAGNOSIS — Z7901 Long term (current) use of anticoagulants: Secondary | ICD-10-CM | POA: Insufficient documentation

## 2022-05-23 DIAGNOSIS — I10 Essential (primary) hypertension: Secondary | ICD-10-CM | POA: Insufficient documentation

## 2022-05-23 DIAGNOSIS — I4819 Other persistent atrial fibrillation: Secondary | ICD-10-CM | POA: Diagnosis present

## 2022-05-23 DIAGNOSIS — E785 Hyperlipidemia, unspecified: Secondary | ICD-10-CM | POA: Diagnosis not present

## 2022-05-23 DIAGNOSIS — Z79899 Other long term (current) drug therapy: Secondary | ICD-10-CM | POA: Insufficient documentation

## 2022-05-23 HISTORY — PX: CARDIOVERSION: SHX1299

## 2022-05-23 SURGERY — CARDIOVERSION
Anesthesia: General

## 2022-05-23 MED ORDER — PROPOFOL 10 MG/ML IV BOLUS
INTRAVENOUS | Status: DC | PRN
  Administered 2022-05-23: 60 mg via INTRAVENOUS

## 2022-05-23 MED ORDER — EPINEPHRINE 1 MG/10ML IJ SOSY
PREFILLED_SYRINGE | INTRAMUSCULAR | Status: AC
  Filled 2022-05-23: qty 10

## 2022-05-23 MED ORDER — ATROPINE SULFATE 1 MG/10ML IJ SOSY
PREFILLED_SYRINGE | INTRAMUSCULAR | Status: AC
  Filled 2022-05-23: qty 10

## 2022-05-23 MED ORDER — PROPOFOL 1000 MG/100ML IV EMUL
INTRAVENOUS | Status: AC
  Filled 2022-05-23: qty 100

## 2022-05-23 MED ORDER — EPHEDRINE 5 MG/ML INJ
INTRAVENOUS | Status: AC
  Filled 2022-05-23: qty 5

## 2022-05-23 MED ORDER — SODIUM CHLORIDE 0.9 % IV SOLN: INTRAVENOUS | Status: DC

## 2022-05-23 MED ORDER — PHENYLEPHRINE 80 MCG/ML (10ML) SYRINGE FOR IV PUSH (FOR BLOOD PRESSURE SUPPORT)
PREFILLED_SYRINGE | INTRAVENOUS | Status: AC
  Filled 2022-05-23: qty 10

## 2022-05-23 NOTE — Procedures (Signed)
Cardioversion procedure note For atrial fibrillation.  Procedure Details:  Consent: Risks of procedure as well as the alternatives and risks of each were explained to the (patient/caregiver).  Consent for procedure obtained.  Time Out: Verified patient identification, verified procedure, site/side was marked, verified correct patient position, special equipment/implants available, medications/allergies/relevent history reviewed, required imaging and test results available.  Performed  Patient placed on cardiac monitor, pulse oximetry, supplemental oxygen as necessary.   Sedation given: propofol IV, Dr.  Gomez Cleverly pads placed anterior and posterior chest.   Cardioverted 2 time(s).   Cardioverted at  [place]. Synchronized biphasic unsuccesful   Evaluation: Findings: Post procedure EKG shows: atrial fibrillation Complications: None Patient did tolerate procedure well.  Recommendations Patient is clinically asymptomatic. -unsure if amio will be ideal due to the chronic toxic side effects. Patient is young in his 37s. Will refer to afib clinic for additional input -cont cardizem and eliquis as prescribed  Time Spent Directly with the Patient:  16 minutes   Kate Sable, M.D.

## 2022-05-23 NOTE — Anesthesia Postprocedure Evaluation (Signed)
Anesthesia Post Note  Patient: Samuel Elliott.  Procedure(s) Performed: CARDIOVERSION  Patient location during evaluation: [place] Recovery Anesthesia Type: General Level of consciousness: awake and alert Pain management: pain level controlled Vital Signs Assessment: post-procedure vital signs reviewed and stable Respiratory status: spontaneous breathing, nonlabored ventilation, respiratory function stable and patient connected to nasal cannula oxygen Cardiovascular status: blood pressure returned to baseline and stable Postop Assessment: no apparent nausea or vomiting Anesthetic complications: no   No notable events documented.   Last Vitals:  Vitals:   05/23/22 0749 05/23/22 0800  BP: 120/85 122/89  Pulse: 96 100  Resp: 18 20  Temp:    SpO2: 94% 94%    Last Pain:  Vitals:   05/23/22 0657  TempSrc: Oral  PainSc: 0-No pain                 Precious Haws Chardai Gangemi

## 2022-05-23 NOTE — Anesthesia Preprocedure Evaluation (Signed)
Anesthesia Evaluation  Patient identified by MRN, date of birth, ID band Patient awake    Reviewed: Allergy & Precautions, NPO status , Patient's Chart, lab work & pertinent test results  History of Anesthesia Complications Negative for: history of anesthetic complications  Airway Mallampati: III  TM Distance: <3 FB Neck ROM: full    Dental  (+) Chipped   Pulmonary neg pulmonary ROS, neg shortness of breath   Pulmonary exam normal        Cardiovascular Exercise Tolerance: Good hypertension, + dysrhythmias Atrial Fibrillation  Rhythm:irregular Rate:Tachycardia     Neuro/Psych   Anxiety     negative neurological ROS     GI/Hepatic negative GI ROS, Neg liver ROS,neg GERD  ,,  Endo/Other  negative endocrine ROS    Renal/GU negative Renal ROS  negative genitourinary   Musculoskeletal   Abdominal   Peds  Hematology negative hematology ROS (+)   Anesthesia Other Findings Past Medical History: No date: History of chicken pox No date: Hypercholesterolemia No date: Hypertension  Past Surgical History: 7/08 & 5/14: TUMOR REMOVAL     Comment:  left shoulder, lipoma  BMI    Body Mass Index: 33.23 kg/m      Reproductive/Obstetrics negative OB ROS                             Anesthesia Physical Anesthesia Plan  ASA: 3  Anesthesia Plan: General   Post-op Pain Management:    Induction: Intravenous  PONV Risk Score and Plan: Propofol infusion and TIVA  Airway Management Planned: Natural Airway and Nasal Cannula  Additional Equipment:   Intra-op Plan:   Post-operative Plan:   Informed Consent: I have reviewed the patients History and Physical, chart, labs and discussed the procedure including the risks, benefits and alternatives for the proposed anesthesia with the patient or authorized representative who has indicated his/her understanding and acceptance.     Dental Advisory  Given  Plan Discussed with: Anesthesiologist, CRNA and Surgeon  Anesthesia Plan Comments: (Patient consented for risks of anesthesia including but not limited to:  - adverse reactions to medications - risk of airway placement if required - damage to eyes, teeth, lips or other oral mucosa - nerve damage due to positioning  - sore throat or hoarseness - Damage to heart, brain, nerves, lungs, other parts of body or loss of life  Patient voiced understanding.)       Anesthesia Quick Evaluation

## 2022-05-23 NOTE — Interval H&P Note (Signed)
History and Physical Interval Note:  05/23/2022 7:45 AM  Samuel Elliott.  has presented today for surgery, with the diagnosis of  Afib.  The various methods of treatment have been discussed with the patient and family. After consideration of risks, benefits and other options for treatment, the patient has consented to  Procedure(s): CARDIOVERSION (N/A) as a surgical intervention.  The patient's history has been reviewed, patient examined, no change in status, stable for surgery.  I have reviewed the patient's chart and labs.  Questions were answered to the patient's satisfaction.     Aaron Edelman Agbor-Etang

## 2022-05-23 NOTE — Transfer of Care (Signed)
Immediate Anesthesia Transfer of Care Note  Patient: Samuel Elliott.  Procedure(s) Performed: CARDIOVERSION  Patient Location:  Special Procedures  Anesthesia Type:General  Level of Consciousness: drowsy and patient cooperative  Airway & Oxygen Therapy: Patient Spontanous Breathing and Patient connected to nasal cannula oxygen  Post-op Assessment: Report given to RN and Post -op Vital signs reviewed and stable  Post vital signs: Reviewed and stable  Last Vitals:  Vitals Value Taken Time  BP 138/89 05/23/22 0745  Temp    Pulse 91 05/23/22 0747  Resp 26 05/23/22 0747  SpO2 92 % 05/23/22 0747  Vitals shown include unvalidated device data.  Last Pain:  Vitals:   05/23/22 0657  TempSrc: Oral  PainSc: 0-No pain         Complications: No notable events documented.

## 2022-05-24 ENCOUNTER — Encounter: Payer: Self-pay | Admitting: Cardiology

## 2022-06-13 ENCOUNTER — Ambulatory Visit: Payer: BC Managed Care – PPO | Attending: Cardiology | Admitting: Cardiology

## 2022-06-13 ENCOUNTER — Encounter: Payer: Self-pay | Admitting: Cardiology

## 2022-06-13 VITALS — BP 146/100 | HR 114 | Ht 69.0 in | Wt 229.0 lb

## 2022-06-13 DIAGNOSIS — I4819 Other persistent atrial fibrillation: Secondary | ICD-10-CM | POA: Diagnosis not present

## 2022-06-13 DIAGNOSIS — I10 Essential (primary) hypertension: Secondary | ICD-10-CM

## 2022-06-13 NOTE — Patient Instructions (Addendum)
Medication Instructions:  Your physician recommends that you continue on your current medications as directed. Please refer to the Current Medication list given to you today.  *If you need a refill on your cardiac medications before your next appointment, please call your pharmacy*  Testing/Procedures: Your physician has recommended that you have a sleep study. This test records several body functions during sleep, including: brain activity, eye movement, oxygen and carbon dioxide blood levels, heart rate and rhythm, breathing rate and rhythm, the flow of air through your mouth and nose, snoring, body muscle movements, and chest and belly movement.  Your physician has recommended that you have a Cardioversion (DCCV). Electrical Cardioversion uses a jolt of electricity to your heart either through paddles or wired patches attached to your chest. This is a controlled, usually prescheduled, procedure. Defibrillation is done under light anesthesia in the hospital, and you usually go home the day of the procedure. This is done to get your heart back into a normal rhythm. You are not awake for the procedure. Please see the instruction sheet given to you today.  Follow-Up: At [facility], you and your health needs are our priority.  As part of our continuing mission to provide you with exceptional heart care, we have created designated Provider Care Teams.  These Care Teams include your primary Cardiologist (physician) and Advanced Practice Providers (APPs -  Physician Assistants and Nurse Practitioners) who all work together to provide you with the care you need, when you need it.  Your next appointment:   8 week(s)  Provider:   Lars Mage, MD    Other Instructions    You are scheduled for a Cardioversion on Tuesday, February 20th with Dr. Quentin Ore. Please arrive at the [place] [facility] (Main Entrance A) at [facility]: [address] [place], [place] [address] at 12:00pm.   DIET:   Nothing to eat or drink after midnight except a sip of water with medications (see medication instructions below)  MEDICATION INSTRUCTIONS:  {Continue taking your anticoagulant (blood thinner): Apixaban (Eliquis).  You will need to continue this after your procedure until you are told by your provider that it is safe to stop.    LABS: Your labs will be done at the hospital prior to your procedure - you will need to arrive 1 and 1/2 hours prior to your procedure.  FYI:  For your safety, and to allow [place] to monitor your vital signs accurately during the surgery/procedure we request: If you have artificial nails, gel coating, SNS etc, please have those removed prior to your surgery/procedure. Not having the nail coverings /polish removed may result in cancellation or delay of your surgery/procedure.  You must have a responsible person to drive you home and stay in the waiting area during your procedure. Failure to do so could result in cancellation.  Bring your insurance cards.  *Special Note: Every effort is made to have your procedure done on time. Occasionally there are emergencies that occur at the hospital that may cause delays. Please be patient if a delay does occur.

## 2022-06-13 NOTE — H&P (View-Only) (Signed)
Electrophysiology Office Note:    Date:  06/13/2022   ID:  Samuel Elliott., DOB [birth year], MRN [id number]  PCP:  Einar Pheasant, MD  [facility] Cardiologist:  Kate Sable, MD  [facility] Electrophysiologist:  Vickie Epley, MD   Referring MD: Kate Sable, MD   Chief Complaint: Persistent atrial fibrillation  History of Present Illness:    Samuel Elliott. is a 57 y.o. male who presents for an evaluation of persistent atrial fibrillation at the request of Dr. Garen Lah. Their medical history includes hypertension.  The patient was diagnosed with atrial fibrillation during a primary care appointment in November 2023.  He was started on Eliquis for stroke prophylaxis.  He presented for cardioversion January 24 and was shocked multiple times unsuccessfully.  Cardioversions were performed with 200 J of energy.  He is relatively asymptomatic.  He snores and has gasping at night according to his wife.    Past Medical History:  Diagnosis Date   History of chicken pox    Hypercholesterolemia    Hypertension     Past Surgical History:  Procedure Laterality Date   CARDIOVERSION N/A 05/23/2022   Procedure: CARDIOVERSION;  Surgeon: Kate Sable, MD;  Location: [facility] ORS;  Service: Cardiovascular;  Laterality: N/A;   TUMOR REMOVAL  7/08 & 5/14   left shoulder, lipoma    Current Medications: Current Meds  Medication Sig   apixaban (ELIQUIS) 5 MG TABS tablet Take 1 tablet (5 mg total) by mouth 2 (two) times daily.   diltiazem (CARDIZEM CD) 120 MG 24 hr capsule Take 1 capsule (120 mg total) by mouth daily.   lisinopril (ZESTRIL) 40 MG tablet Take 1 tablet (40 mg total) by mouth daily.   rosuvastatin (CRESTOR) 20 MG tablet Take 1 tablet (20 mg total) by mouth daily.     Allergies:   Patient has no known allergies.   Social History   Socioeconomic History   Marital status: Married    Spouse name: Not on file   Number of children: Not  on file   Years of education: Not on file   Highest education level: Not on file  Occupational History   Not on file  Tobacco Use   Smoking status: Never    Passive exposure: Never   Smokeless tobacco: Never  Vaping Use   Vaping Use: Never used  Substance and Sexual Activity   Alcohol use: Yes    Alcohol/week: 0.0 standard drinks of alcohol    Comment: rarley   Drug use: No   Sexual activity: Not on file  Other Topics Concern   Not on file  Social History Narrative   Lives with Butch Penny, wife, and pets (cat/dog).   Social Determinants of Health   Financial Resource Strain: Not on file  Food Insecurity: Not on file  Transportation Needs: Not on file  Physical Activity: Not on file  Stress: Not on file  Social Connections: Not on file     Family History: The patient's family history includes Colon cancer in his paternal uncle; Heart Problems in his father; Hyperlipidemia in his father, mother, paternal aunt, paternal grandfather, paternal grandmother, and paternal uncle; Hypertension in his mother; Kidney cancer in his father; Prostate cancer in his paternal uncle.  ROS:   Please see the history of present illness.    All other systems reviewed and are negative.  EKGs/Labs/Other Studies Reviewed:    The following studies were reviewed today:     Recent Labs: 03/21/2022: ALT 17;  TSH 0.74 05/17/2022: BUN 8; Creatinine, Ser 1.04; Hemoglobin 16.5; Platelets 289; Potassium 4.0; Sodium 139  Recent Lipid Panel    Component Value Date/Time   CHOL 128 03/21/2022 1042   TRIG 72.0 03/21/2022 1042   HDL 48.10 03/21/2022 1042   CHOLHDL 3 03/21/2022 1042   VLDL 14.4 03/21/2022 1042   LDLCALC 65 03/21/2022 1042   LDLDIRECT 166.3 05/08/2013 1006    Physical Exam:    VS:  BP (!) 146/100   Pulse (!) 114   Ht 5' 9"$  (1.753 m)   Wt 229 lb (103.9 kg)   SpO2 98%   BMI 33.82 kg/m     Wt Readings from Elliott 3 Encounters:  06/13/22 229 lb (103.9 kg)  05/23/22 225 lb (102.1 kg)   05/17/22 230 lb (104.3 kg)     GEN:  Well nourished, well developed in no acute distress. obese CARDIAC: tachycardic, irregularly irregular, no murmurs, rubs, gallops RESPIRATORY:  Clear to auscultation without rales, wheezing or rhonchi       ASSESSMENT:    1. Persistent atrial fibrillation ([place])   2. Primary hypertension    PLAN:    In order of problems listed above:  #Persistent atrial fibrillation On Eliquis for stroke prophylaxis Given his young age, I favor a rhythm control strategy if at all possible.   I would like to repeat his cardioversion with 360 J of energy in the EP lab. I discussed the cardioversion procedure in detail with the patient including the risks and he wishes to proceed. He has not missed any doses of AC.  #Hypertension Slightly above goal today.  Recommend checking blood pressures 1-2 times per week at home and recording the values.  Recommend bringing these recordings to the primary care physician.  #Sleep disordered breathing I have recommended Itamar home sleep study.    Medication Adjustments/Labs and Tests Ordered: Current medicines are reviewed at length with the patient today.  Concerns regarding medicines are outlined above.  Orders Placed This Encounter  Procedures   Itamar Sleep Study   No orders of the defined types were placed in this encounter.    Signed, [name]. Quentin Ore, MD, [facility], [facility] 06/13/2022 5:04 PM    Electrophysiology [facility]

## 2022-06-13 NOTE — Progress Notes (Signed)
Electrophysiology Office Note:    Date:  06/13/2022   ID:  Samuel Last., DOB Sep 08, 1965, MRN [id number]  PCP:  Einar Pheasant, MD  [facility] Cardiologist:  Kate Sable, MD  [facility] Electrophysiologist:  Vickie Epley, MD   Referring MD: Kate Sable, MD   Chief Complaint: Persistent atrial fibrillation  History of Present Illness:    Samuel Sigers. is a 57 y.o. male who presents for an evaluation of persistent atrial fibrillation at the request of Dr. Garen Lah. Their medical history includes hypertension.  The patient was diagnosed with atrial fibrillation during a primary care appointment in November 2023.  He was started on Eliquis for stroke prophylaxis.  He presented for cardioversion January 24 and was shocked multiple times unsuccessfully.  Cardioversions were performed with 200 J of energy.  He is relatively asymptomatic.  He snores and has gasping at night according to his wife.    Past Medical History:  Diagnosis Date   History of chicken pox    Hypercholesterolemia    Hypertension     Past Surgical History:  Procedure Laterality Date   CARDIOVERSION N/A 05/23/2022   Procedure: CARDIOVERSION;  Surgeon: Kate Sable, MD;  Location: [facility] ORS;  Service: Cardiovascular;  Laterality: N/A;   TUMOR REMOVAL  7/08 & 5/14   left shoulder, lipoma    Current Medications: Current Meds  Medication Sig   apixaban (ELIQUIS) 5 MG TABS tablet Take 1 tablet (5 mg total) by mouth 2 (two) times daily.   diltiazem (CARDIZEM CD) 120 MG 24 hr capsule Take 1 capsule (120 mg total) by mouth daily.   lisinopril (ZESTRIL) 40 MG tablet Take 1 tablet (40 mg total) by mouth daily.   rosuvastatin (CRESTOR) 20 MG tablet Take 1 tablet (20 mg total) by mouth daily.     Allergies:   Patient has no known allergies.   Social History   Socioeconomic History   Marital status: Married    Spouse name: Not on file   Number of children: Not  on file   Years of education: Not on file   Highest education level: Not on file  Occupational History   Not on file  Tobacco Use   Smoking status: Never    Passive exposure: Never   Smokeless tobacco: Never  Vaping Use   Vaping Use: Never used  Substance and Sexual Activity   Alcohol use: Yes    Alcohol/week: 0.0 standard drinks of alcohol    Comment: rarley   Drug use: No   Sexual activity: Not on file  Other Topics Concern   Not on file  Social History Narrative   Lives with Samuel Elliott, wife, and pets (cat/dog).   Social Determinants of Health   Financial Resource Strain: Not on file  Food Insecurity: Not on file  Transportation Needs: Not on file  Physical Activity: Not on file  Stress: Not on file  Social Connections: Not on file     Family History: The patient's family history includes Colon cancer in his paternal uncle; Heart Problems in his father; Hyperlipidemia in his father, mother, paternal aunt, paternal grandfather, paternal grandmother, and paternal uncle; Hypertension in his mother; Kidney cancer in his father; Prostate cancer in his paternal uncle.  ROS:   Please see the history of present illness.    All other systems reviewed and are negative.  EKGs/Labs/Other Studies Reviewed:    The following studies were reviewed today:     Recent Labs: 03/21/2022: ALT 17;  TSH 0.74 05/17/2022: BUN 8; Creatinine, Ser 1.04; Hemoglobin 16.5; Platelets 289; Potassium 4.0; Sodium 139  Recent Lipid Panel    Component Value Date/Time   CHOL 128 03/21/2022 1042   TRIG 72.0 03/21/2022 1042   HDL 48.10 03/21/2022 1042   CHOLHDL 3 03/21/2022 1042   VLDL 14.4 03/21/2022 1042   LDLCALC 65 03/21/2022 1042   LDLDIRECT 166.3 05/08/2013 1006    Physical Exam:    VS:  BP (!) 146/100   Pulse (!) 114   Ht 5' 9"$  (1.753 m)   Wt 229 lb (103.9 kg)   SpO2 98%   BMI 33.82 kg/m     Wt Readings from Last 3 Encounters:  06/13/22 229 lb (103.9 kg)  05/23/22 225 lb (102.1 kg)   05/17/22 230 lb (104.3 kg)     GEN:  Well nourished, well developed in no acute distress. obese CARDIAC: tachycardic, irregularly irregular, no murmurs, rubs, gallops RESPIRATORY:  Clear to auscultation without rales, wheezing or rhonchi       ASSESSMENT:    1. Persistent atrial fibrillation ([place])   2. Primary hypertension    PLAN:    In order of problems listed above:  #Persistent atrial fibrillation On Eliquis for stroke prophylaxis Given his young age, I favor a rhythm control strategy if at all possible.   I would like to repeat his cardioversion with 360 J of energy in the EP lab. I discussed the cardioversion procedure in detail with the patient including the risks and he wishes to proceed. He has not missed any doses of AC.  #Hypertension Slightly above goal today.  Recommend checking blood pressures 1-2 times per week at home and recording the values.  Recommend bringing these recordings to the primary care physician.  #Sleep disordered breathing I have recommended Itamar home sleep study.    Medication Adjustments/Labs and Tests Ordered: Current medicines are reviewed at length with the patient today.  Concerns regarding medicines are outlined above.  Orders Placed This Encounter  Procedures   Itamar Sleep Study   No orders of the defined types were placed in this encounter.    Signed, [name]. Quentin Ore, MD, [facility], [facility] 06/13/2022 5:04 PM    Electrophysiology [facility]

## 2022-06-14 ENCOUNTER — Telehealth: Payer: Self-pay | Admitting: *Deleted

## 2022-06-14 NOTE — Telephone Encounter (Signed)
Will send to [facility] office as sleep study was ordered there.

## 2022-06-14 NOTE — Telephone Encounter (Signed)
Prior Authorization for [facility] sent to [facility] via web portal. Tracking Number . do not require Pre-Authorization by [employer]

## 2022-06-17 ENCOUNTER — Encounter (INDEPENDENT_AMBULATORY_CARE_PROVIDER_SITE_OTHER): Payer: BC Managed Care – PPO | Admitting: Cardiology

## 2022-06-17 DIAGNOSIS — G4733 Obstructive sleep apnea (adult) (pediatric): Secondary | ICD-10-CM

## 2022-06-18 IMAGING — US US THYROID
1 series · 12 of 25 positions shown · non-contrast
Comparison: 12/18/2016

CLINICAL DATA: Thyroid fullness

Thyroid nodule follow-up
EXAM:
THYROID ULTRASOUND
TECHNIQUE: Ultrasound examination of the thyroid gland and adjacent soft
tissues was performed.

[Series 1: us thyroid · 12 of 52 slices shown]
[im 3/52]
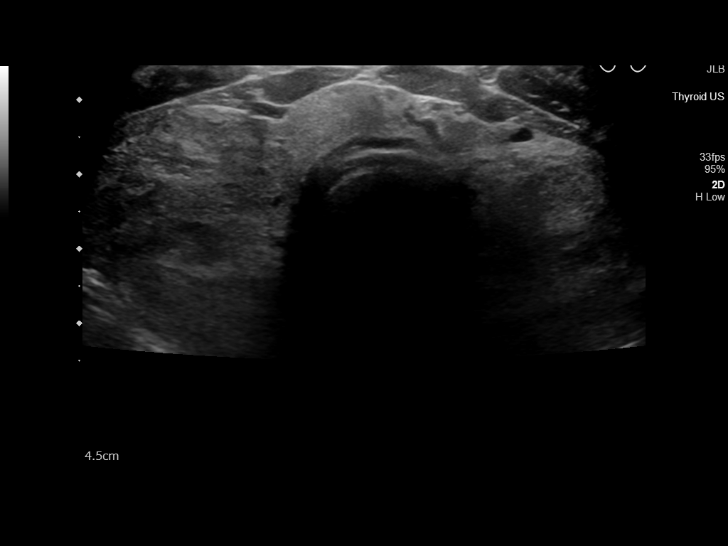
[im 7/52]
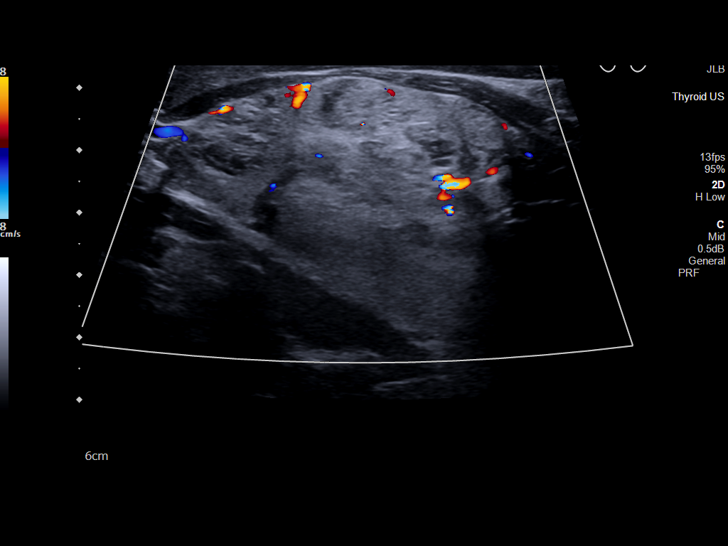
[im 11/52]
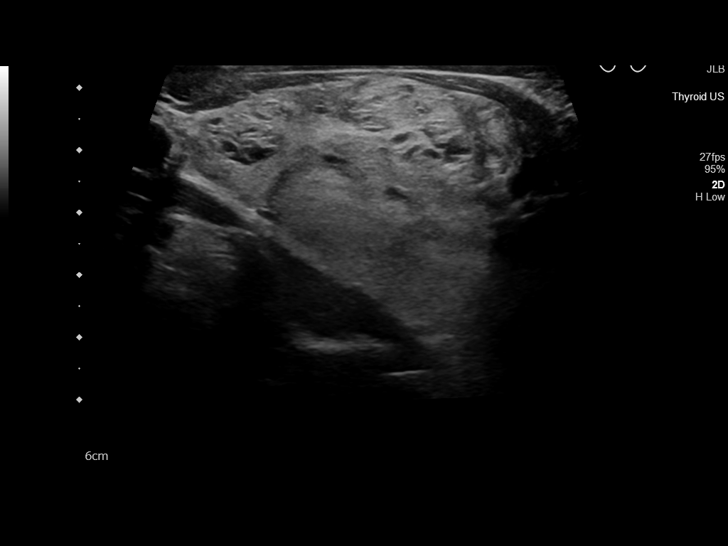
[im 15/52]
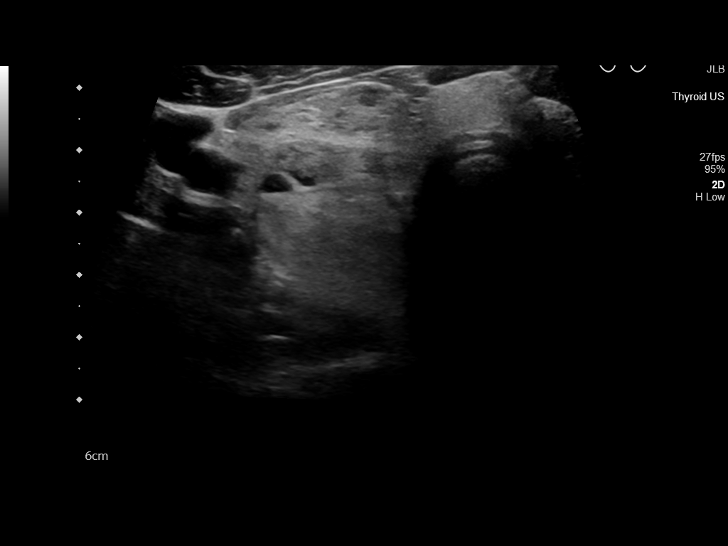
[im 20/52]
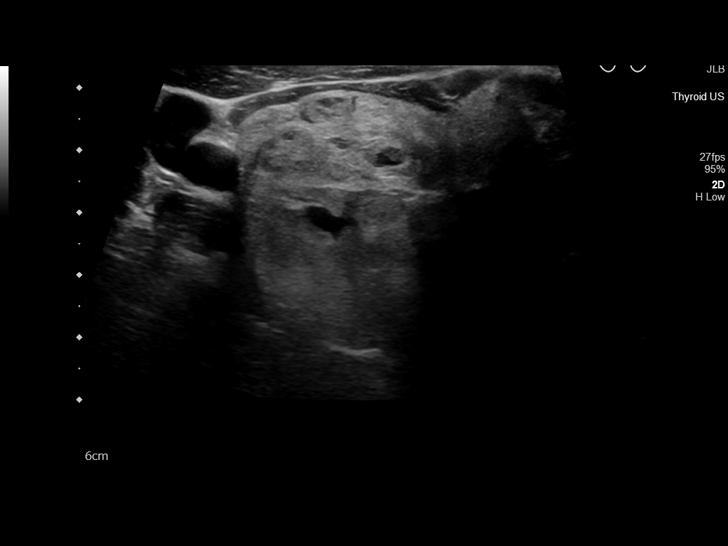
[im 24/52]
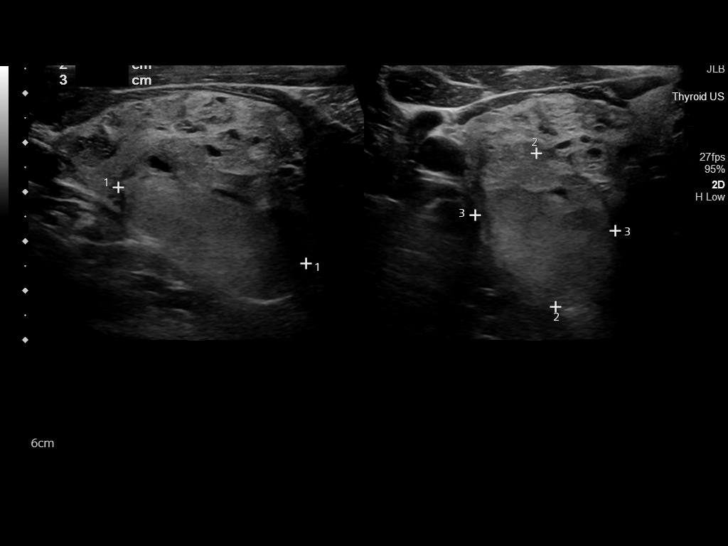
[im 28/52]
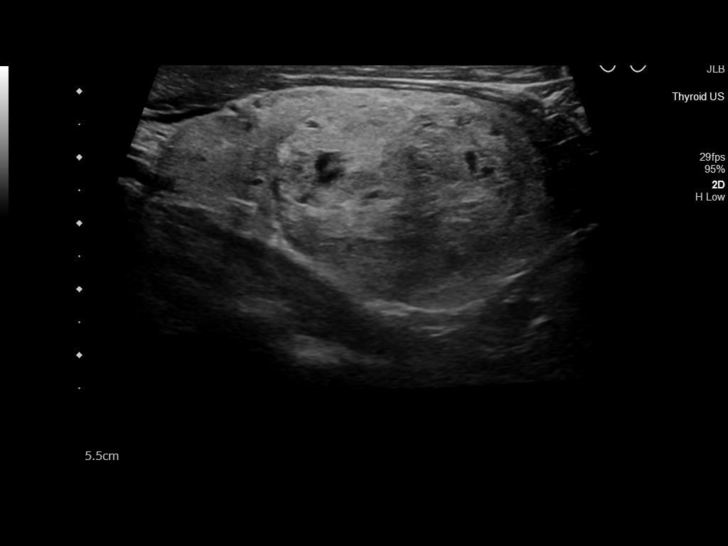
[im 32/52]
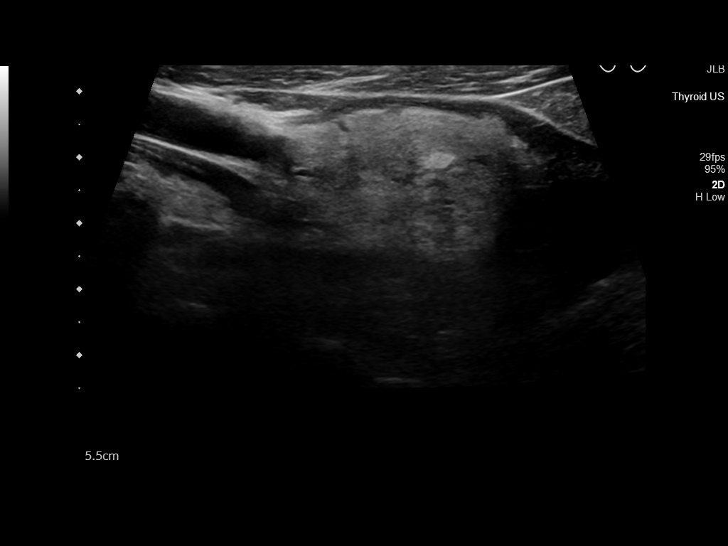
[im 37/52]
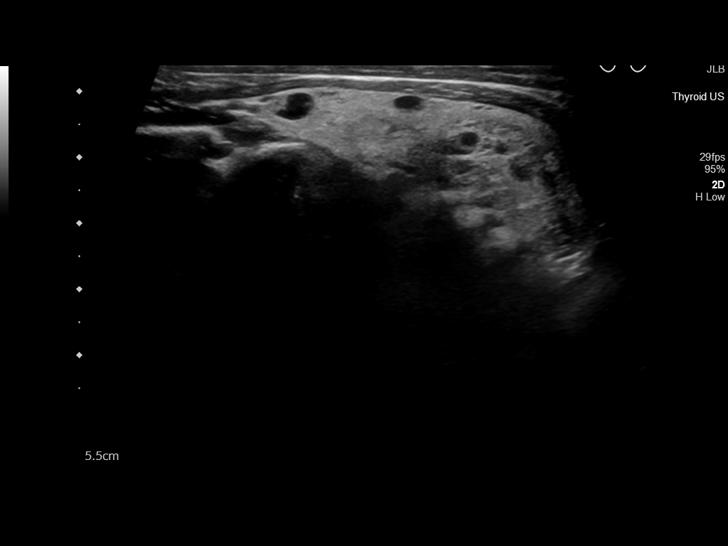
[im 41/52]
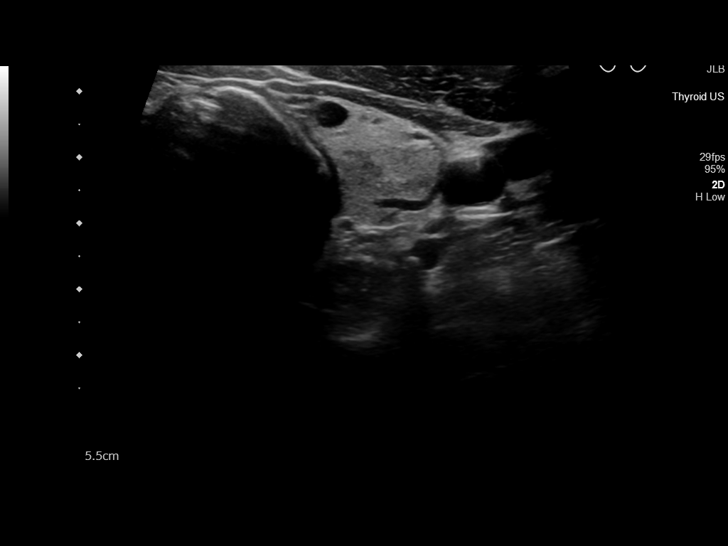
[im 45/52]
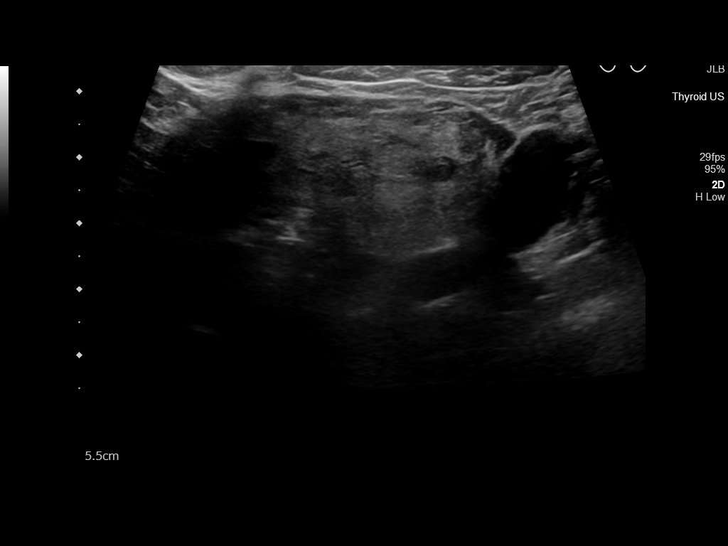
[im 49/52]
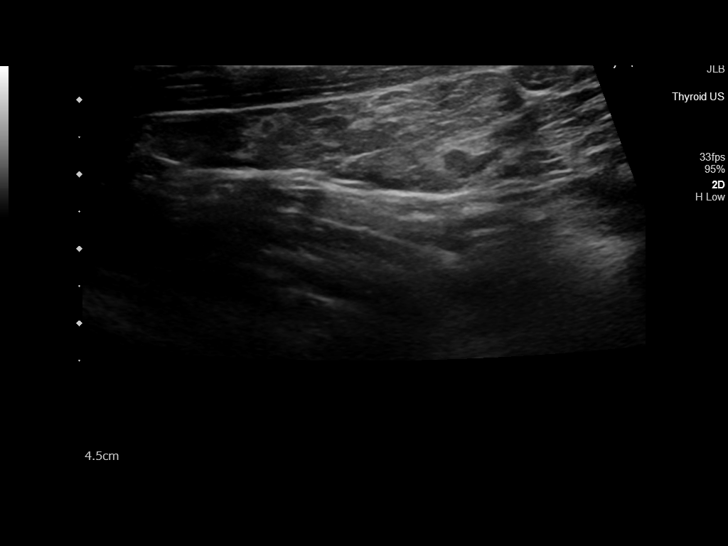

[12 of 25 positions shown; findings below may reference images not displayed]

FINDINGS: Parenchymal Echotexture: Mildly heterogeneous

Isthmus: 0.8 cm

Right lobe: 6.3 x 3.8 x 3.3 cm

Left lobe: 6.5 x 3.3 x 3.2 cm

_________________________________________________________

Estimated total number of nodules >/= 1 cm: 4

Number of spongiform nodules >/=  2 cm not described below (TR1): 0

Number of mixed cystic and solid nodules >/= 1.5 cm not described
below (TR2): 0

_________________________________________________________

Nodule # 1: 1.3 x 0.7 x 1.2 cm predominantly cystic nodule in the
superior right thyroid lobe does not meet criteria for FNA or
surveillance.

_________________________________________________________

Nodule # 2:

Prior biopsy: No

Location: Right; inferior

Maximum size: 4.1 cm; Other 2 dimensions: 3.1 x 2.9 cm, previously,
3.4 x 2.9 x 2.5 cm on 12/18/2016 and 3.4 x 2.4 x 2.5 cm on
04/27/2014.

Composition: solid/almost completely solid (2)

Echogenicity: isoechoic (1)

Shape: taller-than-wide (3)

Margins: ill-defined (0)

Echogenic foci: none (0)

ACR TI-RADS total points: 6.

ACR TI-RADS risk category:  TR4 (4-6 points).

Significant change in size (>/= 20% in two dimensions and minimal
increase of 2 mm): No

Change in features: No

Change in ACR TI-RADS risk category: No

ACR TI-RADS recommendations:

**Given size (>/= 1.5 cm) and appearance, fine needle aspiration of
this moderately suspicious nodule should be considered based on
TI-RADS criteria.

_________________________________________________________

Nodule # 3:

Prior biopsy: No

Location: Right; mid

Maximum size: 1.3 cm; Other 2 dimensions: 0.9 x 1.2 cm, previously,
1.6 x 1.5 x 0.6 cm

Composition: solid/almost completely solid (2)

Echogenicity: isoechoic (1)

Shape: not taller-than-wide (0)

Margins: ill-defined (0)

Echogenic foci: none (0)

ACR TI-RADS total points: 3.

ACR TI-RADS risk category:  TR3 (3 points).

Significant change in size (>/= 20% in two dimensions and minimal
increase of 2 mm): No

Change in features: No

Change in ACR TI-RADS risk category: No

ACR TI-RADS recommendations:

Given size (<1.4 cm) and appearance, this nodule does NOT meet
TI-RADS criteria for biopsy or dedicated follow-up.

_________________________________________________________

Nodule # 4:

Prior biopsy: No

Location: Left; inferior

Maximum size: 3.8 cm; Other 2 dimensions: 2.9 x 2.6 cm, previously,
3.9 x 2.9 x 3.4 cm on 12/18/2016 and 3.6 x 2.5 x 2.7 cm on
04/27/2014.

Composition: solid/almost completely solid (2)

Echogenicity: isoechoic (1)

Shape: taller-than-wide (3)

Margins: smooth (0)

Echogenic foci: none (0)

ACR TI-RADS total points: 6.

ACR TI-RADS risk category:  TR4 (4-6 points).

Significant change in size (>/= 20% in two dimensions and minimal
increase of 2 mm): No

Change in features: No

Change in ACR TI-RADS risk category: No

ACR TI-RADS recommendations:

**Given size (>/= 1.5 cm) and appearance, fine needle aspiration of
this moderately suspicious nodule should be considered based on
TI-RADS criteria.

_________________________________________________________
IMPRESSION: Nodule 2 in the inferior right thyroid lobe and nodule 4 in the
inferior left thyroid lobe both meet criteria for FNA.

Although given that these nodules demonstrate minimal growth since
0161, they are likely benign.

The above is in keeping with the ACR TI-RADS recommendations - [HOSPITAL] 7379;[DATE].

## 2022-06-18 NOTE — Pre-Procedure Instructions (Signed)
Attempted to call patient regarding procedure instructions.  Left voicemail on the following items: Arrival time 1200 Nothing to eat or drink after midnight No meds AM of procedure Responsible person to drive you home and stay with you for 24 hrs  Have you missed any doses of anti-coagulant If you have missed any doses of Eliquis-please let office know right away

## 2022-06-19 ENCOUNTER — Other Ambulatory Visit: Payer: Self-pay

## 2022-06-19 ENCOUNTER — Ambulatory Visit (HOSPITAL_COMMUNITY)
Admission: RE | Admit: 2022-06-19 | Discharge: 2022-06-19 | Disposition: A | Discharge status: Home / Self Care | Payer: BC Managed Care – PPO | Attending: Cardiology | Admitting: Cardiology

## 2022-06-19 ENCOUNTER — Ambulatory Visit (HOSPITAL_COMMUNITY): Payer: BC Managed Care – PPO | Admitting: Anesthesiology

## 2022-06-19 ENCOUNTER — Encounter (HOSPITAL_COMMUNITY)
Admission: RE | Disposition: A | Discharge status: Home / Self Care | Payer: Self-pay | Source: Home / Self Care | Attending: Cardiology

## 2022-06-19 ENCOUNTER — Ambulatory Visit: Payer: BC Managed Care – PPO | Attending: Cardiology

## 2022-06-19 ENCOUNTER — Other Ambulatory Visit (HOSPITAL_COMMUNITY): Payer: Self-pay

## 2022-06-19 DIAGNOSIS — Z01818 Encounter for other preprocedural examination: Secondary | ICD-10-CM

## 2022-06-19 DIAGNOSIS — G4730 Sleep apnea, unspecified: Secondary | ICD-10-CM | POA: Diagnosis not present

## 2022-06-19 DIAGNOSIS — I4819 Other persistent atrial fibrillation: Secondary | ICD-10-CM | POA: Insufficient documentation

## 2022-06-19 DIAGNOSIS — I4891 Unspecified atrial fibrillation: Secondary | ICD-10-CM

## 2022-06-19 DIAGNOSIS — I10 Essential (primary) hypertension: Secondary | ICD-10-CM

## 2022-06-19 DIAGNOSIS — Z7901 Long term (current) use of anticoagulants: Secondary | ICD-10-CM | POA: Diagnosis not present

## 2022-06-19 HISTORY — PX: CARDIOVERSION: SHX1299

## 2022-06-19 LAB — BASIC METABOLIC PANEL
Anion gap: 7 (ref 5–15)
BUN: 8 mg/dL (ref 6–20)
CO2: 25 mmol/L (ref 22–32)
Calcium: 9.6 mg/dL (ref 8.9–10.3)
Chloride: 107 mmol/L (ref 98–111)
Creatinine, Ser: 1.18 mg/dL (ref 0.61–1.24)
GFR, Estimated: >60 mL/min (ref >60)
Glucose, Bld: 101 mg/dL — ABNORMAL HIGH (ref 70–99)
Potassium: 3.9 mmol/L (ref 3.5–5.1)
Sodium: 139 mmol/L (ref 135–145)

## 2022-06-19 LAB — CBC WITH DIFFERENTIAL/PLATELET
Abs Immature Granulocytes: 0.01 10*3/uL (ref 0.00–0.07)
Basophils Absolute: 0.1 10*3/uL (ref 0.0–0.1)
Basophils Relative: 1 %
Eosinophils Absolute: 0.2 10*3/uL (ref 0.0–0.5)
Eosinophils Relative: 3 %
HCT: 44.1 % (ref 39.0–52.0)
Hemoglobin: 15.2 g/dL (ref 13.0–17.0)
Immature Granulocytes: 0 %
Lymphocytes Relative: 26 %
Lymphs Abs: 1.6 10*3/uL (ref 0.7–4.0)
MCH: 29.3 pg (ref 26.0–34.0)
MCHC: 34.5 g/dL (ref 30.0–36.0)
MCV: 85.1 fL (ref 80.0–100.0)
Monocytes Absolute: 0.6 10*3/uL (ref 0.1–1.0)
Monocytes Relative: 9 %
Neutro Abs: 3.7 10*3/uL (ref 1.7–7.7)
Neutrophils Relative %: 61 %
Platelets: 276 10*3/uL (ref 150–400)
RBC: 5.18 MIL/uL (ref 4.22–5.81)
RDW: 12.7 % (ref 11.5–15.5)
WBC: 6.1 10*3/uL (ref 4.0–10.5)
nRBC: 0 % (ref 0.0–0.2)

## 2022-06-19 SURGERY — CARDIOVERSION
Anesthesia: General

## 2022-06-19 MED ORDER — ACETAMINOPHEN 325 MG PO TABS: 650.0000 mg | ORAL_TABLET | ORAL | Status: DC | PRN

## 2022-06-19 MED ORDER — ONDANSETRON HCL 4 MG/2ML IJ SOLN: 4.0000 mg | Freq: Once | INTRAMUSCULAR | Status: DC | PRN

## 2022-06-19 MED ORDER — PROPOFOL 10 MG/ML IV BOLUS
INTRAVENOUS | Status: DC | PRN
  Administered 2022-06-19: 100 mg via INTRAVENOUS

## 2022-06-19 MED ORDER — APIXABAN 5 MG PO TABS
5.0000 mg | ORAL_TABLET | Freq: Two times a day (BID) | ORAL | 2 refills | Status: DC
  Filled 2022-06-19: qty 60, 30d supply, fill #0

## 2022-06-19 MED ORDER — SODIUM CHLORIDE 0.9% FLUSH: 3.0000 mL | Freq: Two times a day (BID) | INTRAVENOUS | Status: DC

## 2022-06-19 MED ORDER — SODIUM CHLORIDE 0.9 % IV SOLN: INTRAVENOUS | Status: DC

## 2022-06-19 MED ORDER — SODIUM CHLORIDE 0.9% FLUSH: 3.0000 mL | INTRAVENOUS | Status: DC | PRN

## 2022-06-19 MED ORDER — SODIUM CHLORIDE 0.9 % IV SOLN: 250.0000 mL | INTRAVENOUS | Status: DC | PRN

## 2022-06-19 MED ORDER — LIDOCAINE 2% (20 MG/ML) 5 ML SYRINGE
INTRAMUSCULAR | Status: DC | PRN
  Administered 2022-06-19: 100 mg via INTRAVENOUS

## 2022-06-19 MED ORDER — ONDANSETRON HCL 4 MG/2ML IJ SOLN: 4.0000 mg | Freq: Four times a day (QID) | INTRAMUSCULAR | Status: DC | PRN

## 2022-06-19 SURGICAL SUPPLY — 1 items: PAD DEFIB RADIO PHYSIO CONN (PAD) IMPLANT

## 2022-06-19 NOTE — Transfer of Care (Signed)
Immediate Anesthesia Transfer of Care Note  Patient: Samuel Elliott.  Procedure(s) Performed: CARDIOVERSION  Patient Location: Cath Lab  Anesthesia Type:General  Level of Consciousness: awake, alert , oriented, patient cooperative, and responds to stimulation  Airway & Oxygen Therapy: Patient Spontanous Breathing  Post-op Assessment: Report given to RN and Post -op Vital signs reviewed and stable  Post vital signs: Reviewed and stable  Last Vitals:  Vitals Value Taken Time  BP    Temp    Pulse    Resp    SpO2      Last Pain:  Vitals:   06/19/22 1253  TempSrc:   PainSc: 0-No pain         Complications: No notable events documented.

## 2022-06-19 NOTE — Anesthesia Postprocedure Evaluation (Signed)
Anesthesia Post Note  Patient: Samuel Elliott.  Procedure(s) Performed: CARDIOVERSION     Patient location during evaluation: PACU Anesthesia Type: General Level of consciousness: awake and alert Pain management: pain level controlled Vital Signs Assessment: post-procedure vital signs reviewed and stable Respiratory status: spontaneous breathing, nonlabored ventilation, respiratory function stable and patient connected to nasal cannula oxygen Cardiovascular status: blood pressure returned to baseline and stable Postop Assessment: no apparent nausea or vomiting Anesthetic complications: no  No notable events documented.  Last Vitals:  Vitals:   06/19/22 1341 06/19/22 1345  BP: 135/89   Pulse: 76 86  Resp: 20 (!) 21  Temp:    SpO2: 92% 93%    Last Pain:  Vitals:   06/19/22 1340  TempSrc: Temporal  PainSc: 0-No pain                 Jailan Trimm S

## 2022-06-19 NOTE — Anesthesia Preprocedure Evaluation (Signed)
Anesthesia Evaluation  Patient identified by MRN, date of birth, ID band Patient awake    Reviewed: Allergy & Precautions, H&P , NPO status , Patient's Chart, lab work & pertinent test results  Airway Mallampati: II  TM Distance: >3 FB Neck ROM: Full    Dental no notable dental hx.    Pulmonary neg pulmonary ROS   Pulmonary exam normal breath sounds clear to auscultation       Cardiovascular hypertension, Pt. on medications Normal cardiovascular exam+ dysrhythmias Atrial Fibrillation  Rhythm:Irregular Rate:Normal     Neuro/Psych negative neurological ROS  negative psych ROS   GI/Hepatic negative GI ROS, Neg liver ROS,,,  Endo/Other  negative endocrine ROS    Renal/GU negative Renal ROS  negative genitourinary   Musculoskeletal negative musculoskeletal ROS (+)    Abdominal   Peds negative pediatric ROS (+)  Hematology negative hematology ROS (+)   Anesthesia Other Findings   Reproductive/Obstetrics negative OB ROS                             Anesthesia Physical Anesthesia Plan  ASA: 3  Anesthesia Plan: General   Post-op Pain Management: Minimal or no pain anticipated   Induction: Intravenous  PONV Risk Score and Plan: 2 and Treatment may vary due to age or medical condition  Airway Management Planned: Mask  Additional Equipment:   Intra-op Plan:   Post-operative Plan: Extubation in OR  Informed Consent: I have reviewed the patients History and Physical, chart, labs and discussed the procedure including the risks, benefits and alternatives for the proposed anesthesia with the patient or authorized representative who has indicated his/her understanding and acceptance.     Dental advisory given  Plan Discussed with: CRNA and Surgeon  Anesthesia Plan Comments:        Anesthesia Quick Evaluation

## 2022-06-19 NOTE — Interval H&P Note (Signed)
History and Physical Interval Note:  06/19/2022 12:41 PM  Samuel Elliott.  has presented today for surgery, with the diagnosis of afib.  The various methods of treatment have been discussed with the patient and family. After consideration of risks, benefits and other options for treatment, the patient has consented to  Procedure(s): CARDIOVERSION (N/A) as a surgical intervention.  The patient's history has been reviewed, patient examined, no change in status, stable for surgery.  I have reviewed the patient's chart and labs.  Questions were answered to the patient's satisfaction.     Makia Bossi T Lexey Fletes

## 2022-06-19 NOTE — Procedures (Signed)
SLEEP STUDY REPORT Patient Information Study Date: 06/17/2022 Patient Name: Samuel Elliott Patient ID: [id number] Birth Date: July 19, 1965 Age: 57 Gender:  BMI: 34.0 (W=229 lb, H=5' 9'') Referring Physician: Lars Mage, MD  TEST DESCRIPTION: Home sleep apnea testing was completed using the WatchPat, a Type 1 device, utilizing peripheral arterial tonometry (PAT), chest movement, actigraphy, pulse oximetry, pulse rate, body position and snore. AHI was calculated with apnea and hypopnea using valid sleep time as the denominator. RDI includes apneas, hypopneas, and RERAs. The data acquired and the scoring of sleep and all associated events were performed in accordance with the recommended standards and specifications as outlined in the AASM Manual for the Scoring of Sleep and Associated Events 2.2.0 (2015).   FINDINGS:   1. Mild Obstructive Sleep Apnea with AHI 12/hr.   2. No Central Sleep Apnea with pAHIc 0.9/hr.   3. Oxygen desaturations as low as 86%.   4. Mild to moderate snoring was present. O2 sats were < 88% for 0.5 min.   5. Total sleep time was 6 hrs and 51 min.   6. 19.8% of total sleep time was spent in REM sleep.   7. Normal sleep onset latency at 18 min.   8. Shortened REM sleep onset latency at 76 min.   9. Total awakenings were 11.  10. Arrhythmia detection:  Suggestive of possible brief atrial fibrillation lasting 6 hr 51 min and 51seconds.  This is not diagnostic and further testing with outpatient telemetry monitoring is recommended.  DIAGNOSIS: Mild Obstructive Sleep Apnea (G47.33) Possible Atrial Fibrillation  RECOMMENDATIONS:   1.  Clinical correlation of these findings is necessary.  The decision to treat obstructive sleep apnea (OSA) is usually based on the presence of apnea symptoms or the presence of associated medical conditions such as Hypertension, Congestive Heart Failure, Atrial Fibrillation or Obesity.  The most common symptoms of OSA are  snoring, gasping for breath while sleeping, daytime sleepiness and fatigue.   2.  Initiating apnea therapy is recommended given the presence of symptoms and/or associated conditions. Recommend proceeding with one of the following:     a.  Auto-CPAP therapy with a pressure range of 5-20cm H2O.     b.  An oral appliance (OA) that can be obtained from certain dentists with expertise in sleep medicine.  These are primarily of use in non-obese patients with mild and moderate disease.     c.  An ENT consultation which may be useful to look for specific causes of obstruction and possible treatment options.     d.  If patient is intolerant to PAP therapy, consider referral to ENT for evaluation for hypoglossal nerve stimulator.   3.  Close follow-up is necessary to ensure success with CPAP or oral appliance therapy for maximum benefit.  4.  A follow-up oximetry study on CPAP is recommended to assess the adequacy of therapy and determine the need for supplemental oxygen or the potential need for Bi-level therapy.  An arterial blood gas to determine the adequacy of baseline ventilation and oxygenation should also be considered.  5.  Healthy sleep recommendations include:  adequate nightly sleep (normal 7-9 hrs/night), avoidance of caffeine after noon and alcohol near bedtime, and maintaining a sleep environment that is cool, dark and quiet.  6.  Weight loss for overweight patients is recommended.  Even modest amounts of weight loss can significantly improve the severity of sleep apnea.  7.  Snoring recommendations include:  weight loss where appropriate,  side sleeping, and avoidance of alcohol before bed.  8.  Operation of motor vehicle should be avoided when sleepy.  Signature:   Fransico Him, MD; [facility]; [place], [place] Board of Sleep Medicine Electronically Signed: 06/19/2022

## 2022-06-20 ENCOUNTER — Encounter (HOSPITAL_COMMUNITY): Payer: Self-pay | Admitting: Cardiology

## 2022-06-20 ENCOUNTER — Encounter: Payer: Self-pay | Admitting: Internal Medicine

## 2022-06-20 DIAGNOSIS — G4733 Obstructive sleep apnea (adult) (pediatric): Secondary | ICD-10-CM | POA: Insufficient documentation

## 2022-06-21 ENCOUNTER — Telehealth: Payer: Self-pay | Admitting: *Deleted

## 2022-06-21 ENCOUNTER — Ambulatory Visit: Payer: BC Managed Care – PPO | Attending: Cardiology

## 2022-06-21 DIAGNOSIS — I4819 Other persistent atrial fibrillation: Secondary | ICD-10-CM

## 2022-06-21 DIAGNOSIS — G4733 Obstructive sleep apnea (adult) (pediatric): Secondary | ICD-10-CM

## 2022-06-21 DIAGNOSIS — I4891 Unspecified atrial fibrillation: Secondary | ICD-10-CM

## 2022-06-21 LAB — ECHOCARDIOGRAM COMPLETE
AR max vel: 3.35 cm2
AV Area VTI: 3.42 cm2
AV Area mean vel: 3.26 cm2
AV Mean grad: 4 mmHg
AV Peak grad: 7.6 mmHg
Ao pk vel: 1.38 m/s
Area-P 1/2: 4.44 cm2
Calc EF: 55.3 %
S' Lateral: 2.6 cm
Single Plane A2C EF: 54.9 %
Single Plane A4C EF: 55.3 %

## 2022-06-21 NOTE — Addendum Note (Signed)
Addended by: Freada Bergeron on: 06/21/2022 05:47 PM   Modules accepted: Orders

## 2022-06-21 NOTE — Telephone Encounter (Signed)
The patient has been notified of the result. Left detailed message on voicemail and informed patient to call back..Tina Temme Green, CMA   

## 2022-06-21 NOTE — Telephone Encounter (Signed)
-----   Message from Lauralee Evener, [place] sent at 06/19/2022  2:30 PM EST -----  ----- Message ----- From: Sueanne Margarita, MD Sent: 06/19/2022   1:41 PM EST To: Cv Div Sleep Studies  Please let patient know that they have sleep apnea and recommend treating with CPAP.  Please order an auto CPAP from 4-15cm H2O with heated humidity and mask of choice.  Order overnight pulse ox on CPAP.  Followup with me in 6 weeks.

## 2022-06-22 ENCOUNTER — Ambulatory Visit: Payer: BC Managed Care – PPO | Admitting: Cardiology

## 2022-06-22 NOTE — Telephone Encounter (Signed)
The patient has been notified of the result and verbalized understanding.  All questions (if any) were answered. Marolyn Hammock, [place] 06/22/2022 11:24 AM    Patient has declined the cpap because he states he rolls around all night in the bed and the cpap is not going to stay on.

## 2022-07-10 ENCOUNTER — Telehealth: Payer: Self-pay | Admitting: Cardiology

## 2022-07-10 MED ORDER — APIXABAN 5 MG PO TABS: 5.0000 mg | ORAL_TABLET | Freq: Two times a day (BID) | ORAL | 3 refills | Status: DC

## 2022-07-10 NOTE — Telephone Encounter (Signed)
Please assist p/t with medication refill.  Thank you.

## 2022-07-10 NOTE — Telephone Encounter (Signed)
Pt last saw Dr Quentin Ore 06/13/22, last labs 06/19/22 Creat 1.18, age 57, weight 102.1kg, based on specified criteria pt is on appropriate dosage of Eliquis '5mg'$  BID for afib.  Will refill rx.

## 2022-07-10 NOTE — Telephone Encounter (Signed)
*  STAT* If patient is at the pharmacy, call can be transferred to refill team.   1. Which medications need to be refilled? (please list name of each medication and dose if known)  new prescription for Eliquis- changing pharmacy  2. Which pharmacy/location (including street and city if local pharmacy) is medication to be sent to?[facility]- please fax to [phone]  3. Do they need a 30 day or 90 day supply? 90 days and refills

## 2022-07-26 ENCOUNTER — Ambulatory Visit: Payer: BC Managed Care – PPO | Admitting: Internal Medicine

## 2022-07-26 ENCOUNTER — Encounter: Payer: Self-pay | Admitting: Internal Medicine

## 2022-07-26 VITALS — BP 128/80 | HR 67 | Temp 98.0°F | Resp 16 | Ht 69.0 in | Wt 226.0 lb

## 2022-07-26 DIAGNOSIS — E7800 Pure hypercholesterolemia, unspecified: Secondary | ICD-10-CM

## 2022-07-26 DIAGNOSIS — I10 Essential (primary) hypertension: Secondary | ICD-10-CM | POA: Diagnosis not present

## 2022-07-26 DIAGNOSIS — M6740 Ganglion, unspecified site: Secondary | ICD-10-CM

## 2022-07-26 DIAGNOSIS — I4891 Unspecified atrial fibrillation: Secondary | ICD-10-CM | POA: Diagnosis not present

## 2022-07-26 DIAGNOSIS — G4733 Obstructive sleep apnea (adult) (pediatric): Secondary | ICD-10-CM

## 2022-07-26 DIAGNOSIS — E041 Nontoxic single thyroid nodule: Secondary | ICD-10-CM

## 2022-07-26 LAB — HEPATIC FUNCTION PANEL
ALT: 17 U/L (ref 0–53)
AST: 16 U/L (ref 0–37)
Albumin: 4.1 g/dL (ref 3.5–5.2)
Alkaline Phosphatase: 86 U/L (ref 39–117)
Bilirubin, Direct: 0.1 mg/dL (ref 0.0–0.3)
Total Bilirubin: 0.7 mg/dL (ref 0.2–1.2)
Total Protein: 6.8 g/dL (ref 6.0–8.3)

## 2022-07-26 LAB — LIPID PANEL
Cholesterol: 147 mg/dL (ref 0–200)
HDL: 48.1 mg/dL (ref >39.00)
LDL Cholesterol: 82 mg/dL (ref 0–99)
NonHDL: 99.15
Total CHOL/HDL Ratio: 3
Triglycerides: 85 mg/dL (ref 0.0–149.0)
VLDL: 17 mg/dL (ref 0.0–40.0)

## 2022-07-26 LAB — BASIC METABOLIC PANEL
BUN: 13 mg/dL (ref 6–23)
CO2: 30 mEq/L (ref 19–32)
Calcium: 10.6 mg/dL — ABNORMAL HIGH (ref 8.4–10.5)
Chloride: 105 mEq/L (ref 96–112)
Creatinine, Ser: 1.09 mg/dL (ref 0.40–1.50)
GFR: 75.01 mL/min (ref >60.00)
Glucose, Bld: 86 mg/dL (ref 70–99)
Potassium: 4.3 mEq/L (ref 3.5–5.1)
Sodium: 140 mEq/L (ref 135–145)

## 2022-07-26 MED ORDER — CARVEDILOL 12.5 MG PO TABS: 12.5000 mg | ORAL_TABLET | Freq: Two times a day (BID) | ORAL | 3 refills | Status: DC

## 2022-07-26 MED ORDER — ROSUVASTATIN CALCIUM 20 MG PO TABS: 20.0000 mg | ORAL_TABLET | Freq: Every day | ORAL | 3 refills | Status: DC

## 2022-07-26 MED ORDER — LISINOPRIL 40 MG PO TABS: 40.0000 mg | ORAL_TABLET | Freq: Every day | ORAL | 3 refills | Status: DC

## 2022-07-26 NOTE — Progress Notes (Signed)
Subjective:    Patient ID: Samuel Last., male    DOB: [birth year], 57 y.o.   MRN: [id number]  Patient here for  Chief Complaint  Patient presents with   Medical Management of Chronic Issues    HPI Here to follow up regarding hypercholesterolemia and hypertension. Recently diagnosed with afib.  On eliquis and cardizem.  Presented for cardioversion 04/2022 and was shocked multiple times unsuccessfully. Saw Dr Quentin Ore 06/13/22.  Recommended HST - revealed mild obstructive sleep apnea.  Recommended auto CPAP.  Also recommended repeat cardioversion - 06/19/22 - converted to SR.  No chest pain or sob reported.  No increased cough or congestion.  No increased heart rate or palpitations.  Does reports since starting diltiazem (after he takes the medication) - notices being a little light headed and headache.  No problems throughout the day.  Relates to the medication.  Blood pressure running higher as well.  Has used cardio mobile - remaining in SR.  No syncope or near syncope.  Eating.  No nausea or vomiting.     Past Medical History:  Diagnosis Date   History of chicken pox    Hypercholesterolemia    Hypertension    Past Surgical History:  Procedure Laterality Date   CARDIOVERSION N/A 05/23/2022   Procedure: CARDIOVERSION;  Surgeon: Kate Sable, MD;  Location: [facility] ORS;  Service: Cardiovascular;  Laterality: N/A;   CARDIOVERSION N/A 06/19/2022   Procedure: CARDIOVERSION;  Surgeon: Vickie Epley, MD;  Location: [place] [facility];  Service: Cardiovascular;  Laterality: N/A;   TUMOR REMOVAL  7/08 & 5/14   left shoulder, lipoma   Family History  Problem Relation Age of Onset   Hyperlipidemia Mother    Hypertension Mother    Hyperlipidemia Father    Kidney cancer Father    Heart Problems Father        Afib   Hyperlipidemia Paternal Aunt    Colon cancer Paternal Uncle    Prostate cancer Paternal Uncle    Hyperlipidemia Paternal Uncle    Hyperlipidemia Paternal  Grandmother    Hyperlipidemia Paternal Grandfather    Social History   Socioeconomic History   Marital status: Married    Spouse name: Not on file   Number of children: Not on file   Years of education: Not on file   Highest education level: Bachelor's degree (e.g., BA, AB, BS)  Occupational History   Not on file  Tobacco Use   Smoking status: Never    Passive exposure: Never   Smokeless tobacco: Never  Vaping Use   Vaping Use: Never used  Substance and Sexual Activity   Alcohol use: Yes    Alcohol/week: 0.0 standard drinks of alcohol    Comment: rarley   Drug use: No   Sexual activity: Not on file  Other Topics Concern   Not on file  Social History Narrative   Lives with Butch Penny, wife, and pets (cat/dog).   Social Determinants of Health   Financial Resource Strain: Low Risk  (07/22/2022)   Overall Financial Resource Strain (CARDIA)    Difficulty of Paying Living Expenses: Not very hard  Food Insecurity: No Food Insecurity (07/22/2022)   Hunger Vital Sign    Worried About Running Out of Food in the Last Year: Never true    Ran Out of Food in the Last Year: Never true  Transportation Needs: No Transportation Needs (07/22/2022)   PRAPARE - Hydrologist (Medical): No  Lack of Transportation (Non-Medical): No  Physical Activity: Insufficiently Active (07/22/2022)   Exercise Vital Sign    Days of Exercise per Week: 4 days    Minutes of Exercise per Session: 20 min  Stress: No Stress Concern Present (07/22/2022)   [place]    Feeling of Stress : Only a little  Social Connections: Socially Integrated (07/22/2022)   Social Connection and Isolation Panel [NHANES]    Frequency of Communication with Friends and Family: More than three times a week    Frequency of Social Gatherings with Friends and Family: More than three times a week    Attends Religious Services: More than 4 times per  year    Active Member of [employer] or Organizations: Yes    Attends Archivist Meetings: Not on file    Marital Status: Married     Review of Systems  Constitutional:  Negative for appetite change and unexpected weight change.  HENT:  Negative for congestion and sinus pressure.   Respiratory:  Negative for cough, chest tightness and shortness of breath.   Cardiovascular:  Negative for chest pain and palpitations.  Gastrointestinal:  Negative for abdominal pain, diarrhea, nausea and vomiting.  Genitourinary:  Negative for difficulty urinating and dysuria.  Musculoskeletal:  Negative for joint swelling and myalgias.  Skin:  Negative for color change and rash.  Neurological:        Intermittent dizziness/light headedness as outlined.  Relates to diltiazem.   Psychiatric/Behavioral:  Negative for agitation and dysphoric mood.        Objective:     BP 128/80   Pulse 67   Temp 98 F (36.7 C)   Resp 16   Ht 5\' 9"  (1.753 m)   Wt 226 lb (102.5 kg)   SpO2 98%   BMI 33.37 kg/m  Wt Readings from Last 3 Encounters:  07/26/22 226 lb (102.5 kg)  06/19/22 225 lb (102.1 kg)  06/13/22 229 lb (103.9 kg)    Physical Exam Vitals reviewed.  Constitutional:      General: He is not in acute distress.    Appearance: Normal appearance. He is well-developed.  HENT:     Head: Normocephalic and atraumatic.     Right Ear: External ear normal.     Left Ear: External ear normal.  Eyes:     General: No scleral icterus.       Right eye: No discharge.        Left eye: No discharge.     Conjunctiva/sclera: Conjunctivae normal.  Cardiovascular:     Rate and Rhythm: Normal rate and regular rhythm.  Pulmonary:     Effort: Pulmonary effort is normal. No respiratory distress.     Breath sounds: Normal breath sounds.  Abdominal:     General: Bowel sounds are normal.     Palpations: Abdomen is soft.     Tenderness: There is no abdominal tenderness.  Musculoskeletal:        General: No  swelling or tenderness.     Cervical back: Neck supple. No tenderness.  Lymphadenopathy:     Cervical: No cervical adenopathy.  Skin:    Findings: No erythema or rash.  Neurological:     Mental Status: He is alert.  Psychiatric:        Mood and Affect: Mood normal.        Behavior: Behavior normal.      Outpatient Encounter Medications as of 07/26/2022  Medication Sig  carvedilol (COREG) 12.5 MG tablet Take 1 tablet (12.5 mg total) by mouth 2 (two) times daily with a meal.   apixaban (ELIQUIS) 5 MG TABS tablet Take 1 tablet (5 mg total) by mouth 2 (two) times daily.   lisinopril (ZESTRIL) 40 MG tablet Take 1 tablet (40 mg total) by mouth daily.   rosuvastatin (CRESTOR) 20 MG tablet Take 1 tablet (20 mg total) by mouth daily.   [DISCONTINUED] diltiazem (CARDIZEM CD) 120 MG 24 hr capsule Take 1 capsule (120 mg total) by mouth daily.   [DISCONTINUED] lisinopril (ZESTRIL) 40 MG tablet Take 1 tablet (40 mg total) by mouth daily.   [DISCONTINUED] rosuvastatin (CRESTOR) 20 MG tablet Take 1 tablet (20 mg total) by mouth daily.   No facility-administered encounter medications on file as of 07/26/2022.     Lab Results  Component Value Date   WBC 6.1 06/19/2022   HGB 15.2 06/19/2022   HCT 44.1 06/19/2022   PLT 276 06/19/2022   GLUCOSE 86 07/26/2022   CHOL 147 07/26/2022   TRIG 85.0 07/26/2022   HDL 48.10 07/26/2022   LDLDIRECT 166.3 05/08/2013   LDLCALC 82 07/26/2022   ALT 17 07/26/2022   AST 16 07/26/2022   NA 140 07/26/2022   K 4.3 07/26/2022   CL 105 07/26/2022   CREATININE 1.09 07/26/2022   BUN 13 07/26/2022   CO2 30 07/26/2022   TSH 0.74 03/21/2022   PSA 0.60 03/21/2022   HGBA1C 5.6 12/05/2016    EP STUDY  Result Date: 06/20/2022 Conclusions: 1.  Successful cardioversion of atrial fibrillation to sinus rhythm 2.  No early apparent complications.       Assessment & Plan:  Hypercholesterolemia Assessment & Plan: Continue crestor.  Low cholesterol diet and  exercise.  Follow lipid panel and liver function tests.    Orders: -     Lipid panel -     Hepatic function panel -     Basic metabolic panel  Ganglion cyst  Atrial fibrillation, unspecified type [facility]) Assessment & Plan: Recently diagnosed with afib.  On eliquis and cardizem.  Presented for cardioversion 04/2022 and was shocked multiple times unsuccessfully. Saw Dr Quentin Ore 06/13/22.  Recommended HST - revealed mild obstructive sleep apnea.  Recommended auto CPAP.  Also recommended repeat cardioversion - 06/19/22 - converted to SR. Reports intolerance to diltiazem.  In SR.  Discussed with pt regarding changing medication.  Will stop diltiazem and start coreg.  See if better blood pressure control.     Essential hypertension, benign Assessment & Plan: Blood pressure as outlined.  Continue lisinopril 40mg  q day.  Not as well controlled now on diltiazem as when he was on amlodipine.  Discussed changes given intolerance to diltiazem and need for better blood pressure control.  Will stop diltiazem and start coreg 12.5mg  bid.   Follow pressures.  Follow metabolic panel.    OSA (obstructive sleep apnea) Assessment & Plan:  Saw Dr Quentin Ore 06/13/22.  Recommended HST - revealed mild obstructive sleep apnea.  Recommended auto CPAP.    Thyroid nodule Assessment & Plan: Saw Dr Gabriel Carina.  S/p biopsies - ok.  Felt no further w/up warranted.  Follow.  TSH 02/2022 - wnl.    Other orders -     Lisinopril; Take 1 tablet (40 mg total) by mouth daily.  Dispense: 90 tablet; Refill: 3 -     Rosuvastatin Calcium; Take 1 tablet (20 mg total) by mouth daily.  Dispense: 90 tablet; Refill: 3 -     Carvedilol;  Take 1 tablet (12.5 mg total) by mouth 2 (two) times daily with a meal.  Dispense: 60 tablet; Refill: 3     Einar Pheasant, MD

## 2022-07-28 ENCOUNTER — Encounter: Payer: Self-pay | Admitting: Internal Medicine

## 2022-07-28 NOTE — Assessment & Plan Note (Signed)
Recently diagnosed with afib.  On eliquis and cardizem.  Presented for cardioversion 04/2022 and was shocked multiple times unsuccessfully. Saw Dr Quentin Ore 06/13/22.  Recommended HST - revealed mild obstructive sleep apnea.  Recommended auto CPAP.  Also recommended repeat cardioversion - 06/19/22 - converted to SR. Reports intolerance to diltiazem.  In SR.  Discussed with pt regarding changing medication.  Will stop diltiazem and start coreg.  See if better blood pressure control.

## 2022-07-28 NOTE — Assessment & Plan Note (Signed)
Saw Dr Quentin Ore 06/13/22.  Recommended HST - revealed mild obstructive sleep apnea.  Recommended auto CPAP.

## 2022-07-28 NOTE — Assessment & Plan Note (Signed)
Continue crestor.  Low cholesterol diet and exercise.  Follow lipid panel and liver function tests.  

## 2022-07-28 NOTE — Assessment & Plan Note (Signed)
Saw Dr Gabriel Carina.  S/p biopsies - ok.  Felt no further w/up warranted.  Follow.  TSH 02/2022 - wnl.

## 2022-07-28 NOTE — Assessment & Plan Note (Signed)
Blood pressure as outlined.  Continue lisinopril 40mg  q day.  Not as well controlled now on diltiazem as when he was on amlodipine.  Discussed changes given intolerance to diltiazem and need for better blood pressure control.  Will stop diltiazem and start coreg 12.5mg  bid.   Follow pressures.  Follow metabolic panel.

## 2022-07-31 ENCOUNTER — Telehealth: Payer: Self-pay

## 2022-07-31 ENCOUNTER — Other Ambulatory Visit: Payer: Self-pay | Admitting: Internal Medicine

## 2022-07-31 NOTE — Telephone Encounter (Signed)
LMTCB. Ok to give results. Calcium lab ordered. Needs non fasting lab in 2-3 weeks

## 2022-07-31 NOTE — Telephone Encounter (Signed)
-----   Message from Einar Pheasant, MD sent at 07/28/2022  2:43 PM EDT ----- Notify - cholesterol level is ok.  Bad cholesterol increased some from last check but relatively stable from checks prior.  Continue crestor, diet and exercise.  We will follow.  Calcium level is just slightly elevated.  May just be a variant.  Continue to stay hydrated.  We will follow.  Recheck calcium in 2-3 weeks.  Kidney function tests and liver function tests - ok.

## 2022-07-31 NOTE — Telephone Encounter (Signed)
Pt called back and I read the message to him and he understood. Pt is scheduled for a non fasting lab on 4/3

## 2022-07-31 NOTE — Progress Notes (Signed)
Order placed for f/u calcium.  

## 2022-07-31 NOTE — Telephone Encounter (Signed)
Notedf.

## 2022-08-07 NOTE — Progress Notes (Unsigned)
  Electrophysiology Office Follow up Visit Note:    Date:  08/07/2022   ID:  Samuel Obey., DOB 05-Sep-1965, MRN [id number]  PCP:  Dale Sula, MD  [facility] Cardiologist:  Debbe Odea, MD  [facility] [place] [facility] Electrophysiologist:  Lanier Prude, MD    Interval History:    Samuel Manwiller. is a 57 y.o. male who presents for a follow up visit.   He had a DCCV 06/19/2022 in the EP lab using 360J.  I last saw the patient in clinic 06/13/2022.      Past medical, surgical, social and family history were reviewed.  ROS:   Please see the history of present illness.    All other systems reviewed and are negative.  EKGs/Labs/Other Studies Reviewed:    The following studies were reviewed today:  EKG:  The ekg ordered today demonstrates ***   Physical Exam:    VS:  There were no vitals taken for this visit.    Wt Readings from Last 3 Encounters:  07/26/22 226 lb (102.5 kg)  06/19/22 225 lb (102.1 kg)  06/13/22 229 lb (103.9 kg)     GEN: *** Well nourished, well developed in no acute distress CARDIAC: ***RRR, no murmurs, rubs, gallops RESPIRATORY:  Clear to auscultation without rales, wheezing or rhonchi       ASSESSMENT:    No diagnosis found. PLAN:    In order of problems listed above:   #Persistent AF S/p DCCV 06/19/2022 using a 360J defib. On eliquis for stroke ppx.   #Hypertension *** goal today.  Recommend checking blood pressures 1-2 times per week at home and recording the values.  Recommend bringing these recordings to the primary care physician.     Signed, Steffanie Dunn, MD, [facility], [facility] 08/07/2022 6:28 PM    Electrophysiology [facility]

## 2022-08-08 ENCOUNTER — Encounter: Payer: Self-pay | Admitting: Cardiology

## 2022-08-08 ENCOUNTER — Ambulatory Visit: Payer: BC Managed Care – PPO | Attending: Cardiology | Admitting: Cardiology

## 2022-08-08 VITALS — BP 160/94 | HR 59 | Ht 69.0 in | Wt 232.0 lb

## 2022-08-08 DIAGNOSIS — R931 Abnormal findings on diagnostic imaging of heart and coronary circulation: Secondary | ICD-10-CM | POA: Diagnosis not present

## 2022-08-08 DIAGNOSIS — I4819 Other persistent atrial fibrillation: Secondary | ICD-10-CM | POA: Diagnosis not present

## 2022-08-08 DIAGNOSIS — I10 Essential (primary) hypertension: Secondary | ICD-10-CM | POA: Diagnosis not present

## 2022-08-08 MED ORDER — HYDROCHLOROTHIAZIDE 12.5 MG PO CAPS: 12.5000 mg | ORAL_CAPSULE | Freq: Every day | ORAL | 3 refills | Status: DC

## 2022-08-08 NOTE — Patient Instructions (Signed)
Medication Instructions:  Your physician has recommended you make the following change in your medication:  1) START taking hydrochlorothiazide (HCTZ) 12.5 mg daily    *If you need a refill on your cardiac medications before your next appointment, please call your pharmacy*  Follow-Up: At [facility], you and your health needs are our priority.  As part of our continuing mission to provide you with exceptional heart care, we have created designated Provider Care Teams.  These Care Teams include your primary Cardiologist (physician) and Advanced Practice Providers (APPs -  Physician Assistants and Nurse Practitioners) who all work together to provide you with the care you need, when you need it.  Your next appointment:   6 month(s)  Provider:   You will see one of the following Advanced Practice Providers on your designated Care Team:   Francis Dowse, Charlott Holler "Mardelle Matte" [place], [place] Sherie Don, NP  Other Instructions Give [place] a call if you would like to schedule an ablation.

## 2022-08-14 ENCOUNTER — Encounter: Payer: Self-pay | Admitting: Internal Medicine

## 2022-08-14 DIAGNOSIS — I10 Essential (primary) hypertension: Secondary | ICD-10-CM

## 2022-08-15 ENCOUNTER — Telehealth: Payer: Self-pay | Admitting: Cardiology

## 2022-08-15 DIAGNOSIS — I4819 Other persistent atrial fibrillation: Secondary | ICD-10-CM

## 2022-08-15 NOTE — Telephone Encounter (Signed)
Patient called in to say that he is ready to schedule his ablation. Please advise

## 2022-08-16 ENCOUNTER — Encounter: Payer: Self-pay | Admitting: Cardiology

## 2022-08-17 NOTE — Telephone Encounter (Signed)
Called and left message for Samuel Elliott to discuss.

## 2022-08-18 NOTE — Telephone Encounter (Signed)
Called Mr Loeffelholz again and left a message to discussed his questions.  Left a message for him to call office with a good number and time to call back.

## 2022-08-20 NOTE — Telephone Encounter (Signed)
Spoke with wife. Patient can be reached 2-6pm at [phone] (ask for [name]) and after 7:30 on his home phone at [phone]. Advised that number may show up as blocked when you call. Wife is going to let him know.

## 2022-08-20 NOTE — Telephone Encounter (Signed)
I spoke to Samuel Elliott and discussed ablation.  Also, he mentioned that Dr Lalla Brothers needed labs and since he was coming in to have labs drawn here, he could just get drawn here.  In reviewing note, Dr Lalla Brothers started him on hctz and recommended labs.  I assume this is a met b.  I have placed the order. Please notify lab just to draw the met b (this has the calcium in the met b).  Do not need separate calcium.

## 2022-08-21 ENCOUNTER — Other Ambulatory Visit (INDEPENDENT_AMBULATORY_CARE_PROVIDER_SITE_OTHER): Payer: BC Managed Care – PPO

## 2022-08-21 ENCOUNTER — Encounter: Payer: Self-pay | Admitting: Internal Medicine

## 2022-08-21 DIAGNOSIS — I4819 Other persistent atrial fibrillation: Secondary | ICD-10-CM

## 2022-08-21 DIAGNOSIS — I10 Essential (primary) hypertension: Secondary | ICD-10-CM

## 2022-08-21 LAB — BASIC METABOLIC PANEL
BUN: 13 mg/dL (ref 6–23)
CO2: 32 mEq/L (ref 19–32)
Calcium: 10.6 mg/dL — ABNORMAL HIGH (ref 8.4–10.5)
Chloride: 104 mEq/L (ref 96–112)
Creatinine, Ser: 1.21 mg/dL (ref 0.40–1.50)
GFR: 66.14 mL/min (ref >60.00)
Glucose, Bld: 100 mg/dL — ABNORMAL HIGH (ref 70–99)
Potassium: 4.1 mEq/L (ref 3.5–5.1)
Sodium: 142 mEq/L (ref 135–145)

## 2022-08-21 LAB — CALCIUM: Calcium: 10.6 mg/dL — ABNORMAL HIGH (ref 8.4–10.5)

## 2022-08-21 NOTE — Addendum Note (Signed)
Addended by: Frutoso Schatz on: 08/21/2022 07:33 AM   Modules accepted: Orders

## 2022-08-21 NOTE — Telephone Encounter (Signed)
Orders placed for labs and cardiac CT

## 2022-08-21 NOTE — Telephone Encounter (Signed)
Noted on schedule to draw BMP only.

## 2022-08-21 NOTE — Telephone Encounter (Signed)
See MyChart message. Patient scheduled for 8/22

## 2022-08-22 ENCOUNTER — Telehealth: Payer: Self-pay

## 2022-08-22 ENCOUNTER — Other Ambulatory Visit: Payer: Self-pay | Admitting: Internal Medicine

## 2022-08-22 LAB — CALCIUM, IONIZED: Calcium, Ion: 5.7 mg/dL — ABNORMAL HIGH (ref 4.7–5.5)

## 2022-08-22 NOTE — Telephone Encounter (Signed)
-----   Message from Dale [place], MD sent at 08/22/2022  8:34 AM EDT ----- Ok.

## 2022-08-22 NOTE — Progress Notes (Signed)
Orders placed for f/u labs.  

## 2022-08-22 NOTE — Telephone Encounter (Signed)
LM to let patient know that Dr Lorin Picket is ok with him waiting until 5/30 to repeat his labs at his appointment

## 2022-08-22 NOTE — Telephone Encounter (Signed)
See result note.  

## 2022-09-27 ENCOUNTER — Encounter: Payer: Self-pay | Admitting: Internal Medicine

## 2022-09-27 ENCOUNTER — Ambulatory Visit: Payer: BC Managed Care – PPO | Admitting: Internal Medicine

## 2022-09-27 VITALS — BP 128/78 | HR 68 | Temp 98.1°F | Resp 16 | Ht 69.0 in | Wt 222.0 lb

## 2022-09-27 DIAGNOSIS — I4891 Unspecified atrial fibrillation: Secondary | ICD-10-CM

## 2022-09-27 DIAGNOSIS — G4733 Obstructive sleep apnea (adult) (pediatric): Secondary | ICD-10-CM

## 2022-09-27 DIAGNOSIS — R0789 Other chest pain: Secondary | ICD-10-CM | POA: Insufficient documentation

## 2022-09-27 DIAGNOSIS — I10 Essential (primary) hypertension: Secondary | ICD-10-CM

## 2022-09-27 DIAGNOSIS — E7800 Pure hypercholesterolemia, unspecified: Secondary | ICD-10-CM

## 2022-09-27 DIAGNOSIS — E041 Nontoxic single thyroid nodule: Secondary | ICD-10-CM

## 2022-09-27 LAB — BASIC METABOLIC PANEL
BUN: 14 mg/dL (ref 6–23)
CO2: 29 mEq/L (ref 19–32)
Calcium: 10.3 mg/dL (ref 8.4–10.5)
Chloride: 104 mEq/L (ref 96–112)
Creatinine, Ser: 1.17 mg/dL (ref 0.40–1.50)
GFR: 68.81 mL/min (ref >60.00)
Glucose, Bld: 92 mg/dL (ref 70–99)
Potassium: 4.2 mEq/L (ref 3.5–5.1)
Sodium: 140 mEq/L (ref 135–145)

## 2022-09-27 LAB — HEPATIC FUNCTION PANEL
ALT: 19 U/L (ref 0–53)
AST: 18 U/L (ref 0–37)
Albumin: 4.2 g/dL (ref 3.5–5.2)
Alkaline Phosphatase: 76 U/L (ref 39–117)
Bilirubin, Direct: 0.1 mg/dL (ref 0.0–0.3)
Total Bilirubin: 0.8 mg/dL (ref 0.2–1.2)
Total Protein: 7.4 g/dL (ref 6.0–8.3)

## 2022-09-27 LAB — VITAMIN D 25 HYDROXY (VIT D DEFICIENCY, FRACTURES): VITD: 23.84 ng/mL — ABNORMAL LOW (ref 30.00–100.00)

## 2022-09-27 MED ORDER — CARVEDILOL 6.25 MG PO TABS: 6.2500 mg | ORAL_TABLET | Freq: Two times a day (BID) | ORAL | 3 refills | Status: DC

## 2022-09-27 NOTE — Patient Instructions (Addendum)
Decrease carvedilol to 6.25mg  twice a day.  Pepcid 20mg  - take 30 minutes before breakfast

## 2022-09-27 NOTE — Assessment & Plan Note (Addendum)
Recently diagnosed with afib.  Was on eliquis and cardizem.  Presented for cardioversion 04/2022 and was shocked multiple times unsuccessfully. Saw Dr Lalla Brothers 06/13/22.  Recommended HST - revealed mild obstructive sleep apnea.  Recommended auto CPAP.  Also recommended repeat cardioversion - 06/19/22 - converted to SR. Reported intolerance to diltiazem.  In SR.  Diltiazem was changed to coreg last visit.  Reports increased fatigue with coreg.  Reviewed outside blood pressures and pulse rate.  Blood pressures have improved with most recent readings 118-126/60-70s. Pulse rate 50s.  Given symptoms, EKG - SR/SB with ventricular rate 57.  Will decrease carvedilol to 6.25mg  bid.  Follow.

## 2022-09-27 NOTE — Progress Notes (Signed)
Subjective:    Patient ID: Samuel Elliott., male    DOB: [birth year], 57 y.o.   MRN: [id number]  Patient here for  Chief Complaint  Patient presents with   Medical Management of Chronic Issues    HPI Here to follow up regarding hypercholesterolemia, afib, hypertension and hypercalcemia.  Had been on eliquis and cardizem.   Presented for cardioversion 04/2022 and was shocked multiple times unsuccessfully. Saw Dr Lalla Brothers 06/13/22.  Recommended HST - revealed mild obstructive sleep apnea.  Recommended auto CPAP.  Also recommended repeat cardioversion - 06/19/22 - converted to SR. Last visit, was having intolerance to diltiazem.  Changed to coreg.  Reports since being on coreg, he has noticed increased fatigue.  Did not notice prior to starting.  Also has noticed some discomfort in his chest (he referred to as "like indigestion").  Notices while at work.  Saw Dr Lalla Brothers in f/u.  Maintaining SR.  Was started on hctz 12.5mg  for better blood pressure control. Is scheduled for ablation 12/20/22. CT prior to the ablation.    Past Medical History:  Diagnosis Date   History of chicken pox    Hypercholesterolemia    Hypertension    Past Surgical History:  Procedure Laterality Date   CARDIOVERSION N/A 05/23/2022   Procedure: CARDIOVERSION;  Surgeon: Debbe Odea, MD;  Location: [facility] ORS;  Service: Cardiovascular;  Laterality: N/A;   CARDIOVERSION N/A 06/19/2022   Procedure: CARDIOVERSION;  Surgeon: Lanier Prude, MD;  Location: [facility] INVASIVE CV LAB;  Service: Cardiovascular;  Laterality: N/A;   TUMOR REMOVAL  7/08 & 5/14   left shoulder, lipoma   Family History  Problem Relation Age of Onset   Hyperlipidemia Mother    Hypertension Mother    Hyperlipidemia Father    Kidney cancer Father    Heart Problems Father        Afib   Hyperlipidemia Paternal Aunt    Colon cancer Paternal Uncle    Prostate cancer Paternal Uncle    Hyperlipidemia Paternal Uncle    Hyperlipidemia Paternal  Grandmother    Hyperlipidemia Paternal Grandfather    Social History   Socioeconomic History   Marital status: Married    Spouse name: Not on file   Number of children: Not on file   Years of education: Not on file   Highest education level: Bachelor's degree (e.g., BA, AB, BS)  Occupational History   Not on file  Tobacco Use   Smoking status: Never    Passive exposure: Never   Smokeless tobacco: Never  Vaping Use   Vaping Use: Never used  Substance and Sexual Activity   Alcohol use: Yes    Alcohol/week: 0.0 standard drinks of alcohol    Comment: rarley   Drug use: No   Sexual activity: Not on file  Other Topics Concern   Not on file  Social History Narrative   Lives with Lupita Leash, wife, and pets (cat/dog).   Social Determinants of Health   Financial Resource Strain: Low Risk  (07/22/2022)   Overall Financial Resource Strain (CARDIA)    Difficulty of Paying Living Expenses: Not very hard  Food Insecurity: No Food Insecurity (07/22/2022)   Hunger Vital Sign    Worried About Running Out of Food in the Last Year: Never true    Ran Out of Food in the Last Year: Never true  Transportation Needs: No Transportation Needs (07/22/2022)   PRAPARE - Transportation    Lack of Transportation (Medical): No    Lack  of Transportation (Non-Medical): No  Physical Activity: Insufficiently Active (07/22/2022)   Exercise Vital Sign    Days of Exercise per Week: 4 days    Minutes of Exercise per Session: 20 min  Stress: No Stress Concern Present (07/22/2022)   [employer] of Occupational Health - Occupational Stress Questionnaire    Feeling of Stress : Only a little  Social Connections: Socially Integrated (07/22/2022)   Social Connection and Isolation Panel [NHANES]    Frequency of Communication with Friends and Family: More than three times a week    Frequency of Social Gatherings with Friends and Family: More than three times a week    Attends Religious Services: More than 4 times per  year    Active Member of [employer] or Organizations: Yes    Attends Banker Meetings: Not on file    Marital Status: Married     Review of Systems  Constitutional:  Positive for fatigue. Negative for appetite change and unexpected weight change.  HENT:  Positive for sinus pressure. Negative for congestion.   Respiratory:  Negative for cough, chest tightness and shortness of breath.   Cardiovascular:  Negative for palpitations and leg swelling.       Reports "indigestion" symptoms as outlined.   Gastrointestinal:  Negative for abdominal pain, diarrhea, nausea and vomiting.  Genitourinary:  Negative for difficulty urinating and dysuria.  Musculoskeletal:  Negative for joint swelling and myalgias.  Skin:  Negative for color change and rash.  Neurological:  Negative for dizziness and headaches.  Psychiatric/Behavioral:  Negative for agitation and dysphoric mood.        Objective:     BP 128/78   Pulse 68   Temp 98.1 F (36.7 C)   Resp 16   Ht 5\' 9"  (1.753 m)   Wt 222 lb (100.7 kg)   SpO2 98%   BMI 32.78 kg/m  Wt Readings from Last 3 Encounters:  09/27/22 222 lb (100.7 kg)  08/08/22 232 lb (105.2 kg)  07/26/22 226 lb (102.5 kg)    Physical Exam Vitals reviewed.  Constitutional:      General: He is not in acute distress.    Appearance: Normal appearance. He is well-developed.  HENT:     Head: Normocephalic and atraumatic.     Right Ear: External ear normal.     Left Ear: External ear normal.  Eyes:     General: No scleral icterus.       Right eye: No discharge.        Left eye: No discharge.     Conjunctiva/sclera: Conjunctivae normal.  Cardiovascular:     Rate and Rhythm: Normal rate and regular rhythm.  Pulmonary:     Effort: Pulmonary effort is normal. No respiratory distress.     Breath sounds: Normal breath sounds.  Abdominal:     General: Bowel sounds are normal.     Palpations: Abdomen is soft.     Tenderness: There is no abdominal tenderness.   Musculoskeletal:        General: No swelling or tenderness.     Cervical back: Neck supple. No tenderness.  Lymphadenopathy:     Cervical: No cervical adenopathy.  Skin:    Findings: No erythema or rash.  Neurological:     Mental Status: He is alert.  Psychiatric:        Mood and Affect: Mood normal.        Behavior: Behavior normal.      Outpatient Encounter Medications as of  09/27/2022  Medication Sig   carvedilol (COREG) 6.25 MG tablet Take 1 tablet (6.25 mg total) by mouth 2 (two) times daily with a meal.   apixaban (ELIQUIS) 5 MG TABS tablet Take 1 tablet (5 mg total) by mouth 2 (two) times daily.   hydrochlorothiazide (MICROZIDE) 12.5 MG capsule Take 1 capsule (12.5 mg total) by mouth daily.   lisinopril (ZESTRIL) 40 MG tablet Take 1 tablet (40 mg total) by mouth daily.   rosuvastatin (CRESTOR) 20 MG tablet Take 1 tablet (20 mg total) by mouth daily.   [DISCONTINUED] carvedilol (COREG) 12.5 MG tablet Take 1 tablet (12.5 mg total) by mouth 2 (two) times daily with a meal.   No facility-administered encounter medications on file as of 09/27/2022.     Lab Results  Component Value Date   WBC 6.1 06/19/2022   HGB 15.2 06/19/2022   HCT 44.1 06/19/2022   PLT 276 06/19/2022   GLUCOSE 92 09/27/2022   CHOL 147 07/26/2022   TRIG 85.0 07/26/2022   HDL 48.10 07/26/2022   LDLDIRECT 166.3 05/08/2013   LDLCALC 82 07/26/2022   ALT 19 09/27/2022   AST 18 09/27/2022   NA 140 09/27/2022   K 4.2 09/27/2022   CL 104 09/27/2022   CREATININE 1.17 09/27/2022   BUN 14 09/27/2022   CO2 29 09/27/2022   TSH 0.74 03/21/2022   PSA 0.60 03/21/2022   HGBA1C 5.6 12/05/2016    EP STUDY  Result Date: 06/20/2022 Conclusions: 1.  Successful cardioversion of atrial fibrillation to sinus rhythm 2.  No early apparent complications.       Assessment & Plan:  Chest discomfort Assessment & Plan: Describes the fatigue and indigestion symptom as outlined.  EKG - SR/SB ventricular rate 57. Will  decrease dose of coreg as outlined to see if this helps with fatigue.  Pepcid as directed. Previously saw general cardiology.  ECHO 05/2022 - EF 60-65%, mildly dilated LA, moderate LVH, no wall motion abnormalities.    Planning for ablation in 11/2022.  CT prior to ablation.    Orders: -     EKG 12-Lead -     EKG 12-Lead  Atrial fibrillation, unspecified type [facility]) Assessment & Plan: Recently diagnosed with afib.  Was on eliquis and cardizem.  Presented for cardioversion 04/2022 and was shocked multiple times unsuccessfully. Saw Dr Lalla Brothers 06/13/22.  Recommended HST - revealed mild obstructive sleep apnea.  Recommended auto CPAP.  Also recommended repeat cardioversion - 06/19/22 - converted to SR. Reported intolerance to diltiazem.  In SR.  Diltiazem was changed to coreg last visit.  Reports increased fatigue with coreg.  Reviewed outside blood pressures and pulse rate.  Blood pressures have improved with most recent readings 118-126/60-70s. Pulse rate 50s.  Given symptoms, EKG - SR/SB with ventricular rate 57.  Will decrease carvedilol to 6.25mg  bid.  Follow.     Orders: -     EKG 12-Lead  Hypercalcemia -     Basic metabolic panel -     VITAMIN D 25 Hydroxy (Vit-D Deficiency, Fractures) -     Hepatic function panel -     Parathyroid hormone, intact (no Ca) -     Calcium, ionized  Essential hypertension, benign Assessment & Plan: Blood pressures reviewed - recently averaging - 118/126/60-70s. Continue lisinopril 40mg  q day. On carvedilol.  Will decrease to 6.25mg  bid as outlined.  Follow pressures.  Follow metabolic panel.    Hypercholesterolemia Assessment & Plan: Continue crestor.  Low cholesterol diet and exercise.  Follow  lipid panel and liver function tests.     OSA (obstructive sleep apnea) Assessment & Plan: Saw Dr Lalla Brothers 06/13/22.  Recommended HST - revealed mild obstructive sleep apnea.  Recommended auto CPAP.    Thyroid nodule Assessment & Plan: Saw Dr Tedd Sias.  S/p biopsies -  ok.  Felt no further w/up warranted.  Follow.  TSH 02/2022 - wnl.    Other orders -     Carvedilol; Take 1 tablet (6.25 mg total) by mouth 2 (two) times daily with a meal.  Dispense: 60 tablet; Refill: 3     Dale Gracey, MD

## 2022-09-28 LAB — CALCIUM, IONIZED: Calcium, Ion: 5.5 mg/dL (ref 4.7–5.5)

## 2022-09-28 LAB — PARATHYROID HORMONE, INTACT (NO CA): PTH: 57 pg/mL (ref 16–77)

## 2022-09-29 ENCOUNTER — Encounter: Payer: Self-pay | Admitting: Internal Medicine

## 2022-09-29 NOTE — Assessment & Plan Note (Signed)
Saw Dr Solum.  S/p biopsies - ok.  Felt no further w/up warranted.  Follow.  TSH 02/2022 - wnl.  

## 2022-09-29 NOTE — Assessment & Plan Note (Signed)
Saw Dr Lambert 06/13/22.  Recommended HST - revealed mild obstructive sleep apnea.  Recommended auto CPAP.  

## 2022-09-29 NOTE — Assessment & Plan Note (Signed)
Blood pressures reviewed - recently averaging - 118/126/60-70s. Continue lisinopril 40mg  q day. On carvedilol.  Will decrease to 6.25mg  bid as outlined.  Follow pressures.  Follow metabolic panel.

## 2022-09-29 NOTE — Assessment & Plan Note (Addendum)
Describes the fatigue and indigestion symptom as outlined.  EKG - SR/SB ventricular rate 57. Will decrease dose of coreg as outlined to see if this helps with fatigue.  Pepcid as directed. Previously saw general cardiology.  ECHO 05/2022 - EF 60-65%, mildly dilated LA, moderate LVH, no wall motion abnormalities.    Planning for ablation in 11/2022.  CT prior to ablation.

## 2022-09-29 NOTE — Assessment & Plan Note (Signed)
Continue crestor.  Low cholesterol diet and exercise.  Follow lipid panel and liver function tests.  

## 2022-10-30 ENCOUNTER — Encounter: Payer: Self-pay | Admitting: Internal Medicine

## 2022-10-31 ENCOUNTER — Other Ambulatory Visit: Payer: Self-pay

## 2022-10-31 DIAGNOSIS — I4819 Other persistent atrial fibrillation: Secondary | ICD-10-CM

## 2022-11-06 ENCOUNTER — Telehealth: Payer: Self-pay | Admitting: Cardiology

## 2022-11-06 NOTE — Telephone Encounter (Signed)
Patient is returning a call in regards to his out of pocket cost for his upcoming ablation.

## 2022-11-29 ENCOUNTER — Encounter: Payer: Self-pay | Admitting: Internal Medicine

## 2022-11-29 ENCOUNTER — Ambulatory Visit: Payer: BC Managed Care – PPO | Admitting: Internal Medicine

## 2022-11-29 VITALS — BP 128/74 | HR 60 | Temp 97.9°F | Resp 16 | Ht 69.0 in | Wt 222.0 lb

## 2022-11-29 DIAGNOSIS — E7800 Pure hypercholesterolemia, unspecified: Secondary | ICD-10-CM | POA: Diagnosis not present

## 2022-11-29 DIAGNOSIS — G4733 Obstructive sleep apnea (adult) (pediatric): Secondary | ICD-10-CM

## 2022-11-29 DIAGNOSIS — I4891 Unspecified atrial fibrillation: Secondary | ICD-10-CM | POA: Diagnosis not present

## 2022-11-29 DIAGNOSIS — I10 Essential (primary) hypertension: Secondary | ICD-10-CM | POA: Diagnosis not present

## 2022-11-29 DIAGNOSIS — E041 Nontoxic single thyroid nodule: Secondary | ICD-10-CM

## 2022-11-29 LAB — LIPID PANEL
Cholesterol: 135 mg/dL (ref 0–200)
HDL: 46 mg/dL (ref >39.00)
LDL Cholesterol: 73 mg/dL (ref 0–99)
NonHDL: 88.98
Total CHOL/HDL Ratio: 3
Triglycerides: 80 mg/dL (ref 0.0–149.0)
VLDL: 16 mg/dL (ref 0.0–40.0)

## 2022-11-29 LAB — HEPATIC FUNCTION PANEL
ALT: 19 U/L (ref 0–53)
AST: 17 U/L (ref 0–37)
Albumin: 4.1 g/dL (ref 3.5–5.2)
Alkaline Phosphatase: 74 U/L (ref 39–117)
Bilirubin, Direct: 0.2 mg/dL (ref 0.0–0.3)
Total Bilirubin: 0.7 mg/dL (ref 0.2–1.2)
Total Protein: 7.2 g/dL (ref 6.0–8.3)

## 2022-11-29 LAB — BASIC METABOLIC PANEL
BUN: 12 mg/dL (ref 6–23)
CO2: 32 mEq/L (ref 19–32)
Calcium: 10.4 mg/dL (ref 8.4–10.5)
Chloride: 104 mEq/L (ref 96–112)
Creatinine, Ser: 1.2 mg/dL (ref 0.40–1.50)
GFR: 66.67 mL/min (ref >60.00)
Glucose, Bld: 97 mg/dL (ref 70–99)
Potassium: 4.2 mEq/L (ref 3.5–5.1)
Sodium: 141 mEq/L (ref 135–145)

## 2022-11-29 MED ORDER — CARVEDILOL 6.25 MG PO TABS: 6.2500 mg | ORAL_TABLET | Freq: Two times a day (BID) | ORAL | 3 refills | Status: DC

## 2022-11-29 NOTE — Assessment & Plan Note (Signed)
Saw Dr Solum.  S/p biopsies - ok.  Felt no further w/up warranted.  Follow.  TSH 02/2022 - wnl.  

## 2022-11-29 NOTE — Assessment & Plan Note (Signed)
Continue crestor.  Low cholesterol diet and exercise.  Follow lipid panel and liver function tests.  

## 2022-11-29 NOTE — Progress Notes (Signed)
Subjective:    Patient ID: Samuel Obey., male    DOB: 1966-03-23, 57 y.o.   MRN: [id number]  Patient here for  Chief Complaint  Patient presents with   Medical Management of Chronic Issues    HPI Here for follow up - follow up regarding hypercholesterolemia, afib, hypertension and hypercalcemia. Had been on eliquis and cardizem. Presented for cardioversion 04/2022 and was shocked multiple times unsuccessfully. Saw Dr Lalla Brothers 06/13/22. Recommended HST - revealed mild obstructive sleep apnea. Recommended auto CPAP. Also recommended repeat cardioversion - 06/19/22 - converted to SR.  Was having intolerance to diltiazem.  Changed to coreg.  Reported fatigue with coreg.  Saw Dr Lalla Brothers in f/u.  Maintaining SR.  Was started on hctz 12.5mg  for better blood pressure control. Is scheduled for ablation 12/20/22. CT prior to the ablation. Coreg was decreased to 6.25mg  bid last visit. Feeling better.  Energy is better since changing.  No chest pain or sob reported.  No increased heart rate or palpitations noted.  No abdominal pain or bowel change.  Overall he feels he is doing well.    Past Medical History:  Diagnosis Date   History of chicken pox    Hypercholesterolemia    Hypertension    Past Surgical History:  Procedure Laterality Date   CARDIOVERSION N/A 05/23/2022   Procedure: CARDIOVERSION;  Surgeon: Debbe Odea, MD;  Location: [facility] ORS;  Service: Cardiovascular;  Laterality: N/A;   CARDIOVERSION N/A 06/19/2022   Procedure: CARDIOVERSION;  Surgeon: Lanier Prude, MD;  Location: [facility] INVASIVE CV LAB;  Service: Cardiovascular;  Laterality: N/A;   TUMOR REMOVAL  7/08 & 5/14   left shoulder, lipoma   Family History  Problem Relation Age of Onset   Hyperlipidemia Mother    Hypertension Mother    Hyperlipidemia Father    Kidney cancer Father    Heart Problems Father        Afib   Hyperlipidemia Paternal Aunt    Colon cancer Paternal Uncle    Prostate cancer Paternal Uncle     Hyperlipidemia Paternal Uncle    Hyperlipidemia Paternal Grandmother    Hyperlipidemia Paternal Grandfather    Social History   Socioeconomic History   Marital status: Married    Spouse name: Not on file   Number of children: Not on file   Years of education: Not on file   Highest education level: Bachelor's degree (e.g., BA, AB, BS)  Occupational History   Not on file  Tobacco Use   Smoking status: Never    Passive exposure: Never   Smokeless tobacco: Never  Vaping Use   Vaping status: Never Used  Substance and Sexual Activity   Alcohol use: Yes    Alcohol/week: 0.0 standard drinks of alcohol    Comment: rarley   Drug use: No   Sexual activity: Not on file  Other Topics Concern   Not on file  Social History Narrative   Lives with Lupita Leash, wife, and pets (cat/dog).   Social Determinants of Health   Financial Resource Strain: Low Risk  (07/22/2022)   Overall Financial Resource Strain (CARDIA)    Difficulty of Paying Living Expenses: Not very hard  Food Insecurity: No Food Insecurity (07/22/2022)   Hunger Vital Sign    Worried About Running Out of Food in the Last Year: Never true    Ran Out of Food in the Last Year: Never true  Transportation Needs: No Transportation Needs (07/22/2022)   PRAPARE - Transportation  Lack of Transportation (Medical): No    Lack of Transportation (Non-Medical): No  Physical Activity: Insufficiently Active (07/22/2022)   Exercise Vital Sign    Days of Exercise per Week: 4 days    Minutes of Exercise per Session: 20 min  Stress: No Stress Concern Present (07/22/2022)   [employer] of Occupational Health - Occupational Stress Questionnaire    Feeling of Stress : Only a little  Social Connections: Socially Integrated (07/22/2022)   Social Connection and Isolation Panel [NHANES]    Frequency of Communication with Friends and Family: More than three times a week    Frequency of Social Gatherings with Friends and Family: More than three  times a week    Attends Religious Services: More than 4 times per year    Active Member of [employer] or Organizations: Yes    Attends Banker Meetings: Not on file    Marital Status: Married     Review of Systems  Constitutional:  Negative for appetite change and unexpected weight change.  HENT:  Negative for congestion and sinus pressure.   Respiratory:  Negative for cough, chest tightness and shortness of breath.   Cardiovascular:  Negative for chest pain, palpitations and leg swelling.  Gastrointestinal:  Negative for abdominal pain, diarrhea, nausea and vomiting.  Genitourinary:  Negative for difficulty urinating and dysuria.  Musculoskeletal:  Negative for joint swelling and myalgias.  Skin:  Negative for color change and rash.  Neurological:  Negative for dizziness and headaches.  Psychiatric/Behavioral:  Negative for agitation and dysphoric mood.        Objective:     BP 128/74   Pulse 60   Temp 97.9 F (36.6 C)   Resp 16   Ht 5\' 9"  (1.753 m)   Wt 222 lb (100.7 kg)   SpO2 97%   BMI 32.78 kg/m  Wt Readings from Last 3 Encounters:  11/29/22 222 lb (100.7 kg)  09/27/22 222 lb (100.7 kg)  08/08/22 232 lb (105.2 kg)    Physical Exam Vitals reviewed.  Constitutional:      General: He is not in acute distress.    Appearance: Normal appearance. He is well-developed.  HENT:     Head: Normocephalic and atraumatic.     Right Ear: External ear normal.     Left Ear: External ear normal.  Eyes:     General: No scleral icterus.       Right eye: No discharge.        Left eye: No discharge.     Conjunctiva/sclera: Conjunctivae normal.  Cardiovascular:     Rate and Rhythm: Normal rate and regular rhythm.  Pulmonary:     Effort: Pulmonary effort is normal. No respiratory distress.     Breath sounds: Normal breath sounds.  Abdominal:     General: Bowel sounds are normal.     Palpations: Abdomen is soft.     Tenderness: There is no abdominal tenderness.   Musculoskeletal:        General: No swelling or tenderness.     Cervical back: Neck supple. No tenderness.  Lymphadenopathy:     Cervical: No cervical adenopathy.  Skin:    Findings: No erythema or rash.  Neurological:     Mental Status: He is alert.  Psychiatric:        Mood and Affect: Mood normal.        Behavior: Behavior normal.      Outpatient Encounter Medications as of 11/29/2022  Medication Sig  apixaban (ELIQUIS) 5 MG TABS tablet Take 1 tablet (5 mg total) by mouth 2 (two) times daily.   carvedilol (COREG) 6.25 MG tablet Take 1 tablet (6.25 mg total) by mouth 2 (two) times daily with a meal.   hydrochlorothiazide (MICROZIDE) 12.5 MG capsule Take 1 capsule (12.5 mg total) by mouth daily.   lisinopril (ZESTRIL) 40 MG tablet Take 1 tablet (40 mg total) by mouth daily.   rosuvastatin (CRESTOR) 20 MG tablet Take 1 tablet (20 mg total) by mouth daily.   [DISCONTINUED] carvedilol (COREG) 6.25 MG tablet Take 1 tablet (6.25 mg total) by mouth 2 (two) times daily with a meal.   No facility-administered encounter medications on file as of 11/29/2022.     Lab Results  Component Value Date   WBC 6.1 06/19/2022   HGB 15.2 06/19/2022   HCT 44.1 06/19/2022   PLT 276 06/19/2022   GLUCOSE 92 09/27/2022   CHOL 147 07/26/2022   TRIG 85.0 07/26/2022   HDL 48.10 07/26/2022   LDLDIRECT 166.3 05/08/2013   LDLCALC 82 07/26/2022   ALT 19 09/27/2022   AST 18 09/27/2022   NA 140 09/27/2022   K 4.2 09/27/2022   CL 104 09/27/2022   CREATININE 1.17 09/27/2022   BUN 14 09/27/2022   CO2 29 09/27/2022   TSH 0.74 03/21/2022   PSA 0.60 03/21/2022   HGBA1C 5.6 12/05/2016    EP STUDY  Result Date: 06/20/2022 Conclusions: 1.  Successful cardioversion of atrial fibrillation to sinus rhythm 2.  No early apparent complications.       Assessment & Plan:  Hypercholesterolemia Assessment & Plan: Continue crestor.  Low cholesterol diet and exercise.  Follow lipid panel and liver function  tests.    Orders: -     Lipid panel -     Hepatic function panel -     Basic metabolic panel  Atrial fibrillation, unspecified type [facility]) Assessment & Plan: Recently diagnosed with afib.  Was on eliquis and cardizem.  Presented for cardioversion 04/2022 and was shocked multiple times unsuccessfully. Saw Dr Lalla Brothers 06/13/22.  Recommended HST - revealed mild obstructive sleep apnea.  Recommended auto CPAP.  Also recommended repeat cardioversion - 06/19/22 - converted to SR. Reported intolerance to diltiazem.  Was changed to coreg. In SR.  Reviewed outside blood pressures and pulse rate.  Blood pressures averaging -  117-120s/60-70s. Pulse rate 50s.  Carvedilol decreased to 6.25mg  bid last visit.   Energy better.  Planning for ablation 12/20/22.    Essential hypertension, benign Assessment & Plan: Continues on coreg and hydrochlorothiazide.  Blood pressures doing well. No changes.  Check metabolic panel.    OSA (obstructive sleep apnea) Assessment & Plan: Saw Dr Lalla Brothers 06/13/22.  Recommended HST - revealed mild obstructive sleep apnea.  Recommended auto CPAP.     Thyroid nodule Assessment & Plan: Saw Dr Tedd Sias.  S/p biopsies - ok.  Felt no further w/up warranted.  Follow.  TSH 02/2022 - wnl.    Other orders -     Carvedilol; Take 1 tablet (6.25 mg total) by mouth 2 (two) times daily with a meal.  Dispense: 180 tablet; Refill: 3     Dale [place], MD

## 2022-11-29 NOTE — Assessment & Plan Note (Signed)
Saw Dr Quentin Ore 06/13/22.  Recommended HST - revealed mild obstructive sleep apnea.  Recommended auto CPAP.

## 2022-11-29 NOTE — Assessment & Plan Note (Signed)
Continues on coreg and hydrochlorothiazide.  Blood pressures doing well. No changes.  Check metabolic panel.

## 2022-11-29 NOTE — Assessment & Plan Note (Signed)
Recently diagnosed with afib.  Was on eliquis and cardizem.  Presented for cardioversion 04/2022 and was shocked multiple times unsuccessfully. Saw Dr Lalla Brothers 06/13/22.  Recommended HST - revealed mild obstructive sleep apnea.  Recommended auto CPAP.  Also recommended repeat cardioversion - 06/19/22 - converted to SR. Reported intolerance to diltiazem.  Was changed to coreg. In SR.  Reviewed outside blood pressures and pulse rate.  Blood pressures averaging -  117-120s/60-70s. Pulse rate 50s.  Carvedilol decreased to 6.25mg  bid last visit.   Energy better.  Planning for ablation 12/20/22.

## 2022-12-11 ENCOUNTER — Other Ambulatory Visit
Admission: RE | Admit: 2022-12-11 | Discharge: 2022-12-11 | Disposition: A | Discharge status: Home / Self Care | Payer: BC Managed Care – PPO | Attending: Cardiology | Admitting: Cardiology

## 2022-12-11 DIAGNOSIS — I4819 Other persistent atrial fibrillation: Secondary | ICD-10-CM | POA: Insufficient documentation

## 2022-12-11 LAB — CBC WITH DIFFERENTIAL/PLATELET
Abs Immature Granulocytes: 0.01 10*3/uL (ref 0.00–0.07)
Basophils Absolute: 0.1 10*3/uL (ref 0.0–0.1)
Basophils Relative: 1 %
Eosinophils Absolute: 0.4 10*3/uL (ref 0.0–0.5)
Eosinophils Relative: 5 %
HCT: 43.5 % (ref 39.0–52.0)
Hemoglobin: 15 g/dL (ref 13.0–17.0)
Immature Granulocytes: 0 %
Lymphocytes Relative: 27 %
Lymphs Abs: 1.7 10*3/uL (ref 0.7–4.0)
MCH: 29.4 pg (ref 26.0–34.0)
MCHC: 34.5 g/dL (ref 30.0–36.0)
MCV: 85.1 fL (ref 80.0–100.0)
Monocytes Absolute: 0.6 10*3/uL (ref 0.1–1.0)
Monocytes Relative: 10 %
Neutro Abs: 3.7 10*3/uL (ref 1.7–7.7)
Neutrophils Relative %: 57 %
Platelets: 280 10*3/uL (ref 150–400)
RBC: 5.11 MIL/uL (ref 4.22–5.81)
RDW: 12.2 % (ref 11.5–15.5)
WBC: 6.4 10*3/uL (ref 4.0–10.5)
nRBC: 0 % (ref 0.0–0.2)

## 2022-12-14 ENCOUNTER — Other Ambulatory Visit: Payer: BC Managed Care – PPO

## 2022-12-17 ENCOUNTER — Encounter: Payer: Self-pay | Admitting: Cardiology

## 2022-12-18 ENCOUNTER — Ambulatory Visit
Admission: RE | Admit: 2022-12-18 | Discharge: 2022-12-18 | Disposition: A | Discharge status: Home / Self Care | Payer: BC Managed Care – PPO | Source: Ambulatory Visit | Attending: Cardiology | Admitting: Cardiology

## 2022-12-18 DIAGNOSIS — I4819 Other persistent atrial fibrillation: Secondary | ICD-10-CM | POA: Diagnosis present

## 2022-12-18 MED ORDER — IOHEXOL 350 MG/ML SOLN
80.0000 mL | Freq: Once | INTRAVENOUS | Status: AC | PRN
  Administered 2022-12-18: 80 mL via INTRAVENOUS

## 2022-12-19 NOTE — Pre-Procedure Instructions (Signed)
Spoke with patient's wife Lupita Leash,  Reviewed the following items: Arrival time 0515 Nothing to eat or drink after midnight No meds AM of procedure Responsible person to drive you home and stay with you for 24 hrs  Have you missed any doses of anti-coagulant Eliquis- takes twice a day, hasn't missed any doses.  Don't take dose in the morning.

## 2022-12-20 ENCOUNTER — Ambulatory Visit (HOSPITAL_COMMUNITY): Payer: Self-pay | Admitting: Anesthesiology

## 2022-12-20 ENCOUNTER — Ambulatory Visit (HOSPITAL_COMMUNITY)
Admission: RE | Disposition: A | Discharge status: Home / Self Care | Payer: Self-pay | Source: Home / Self Care | Attending: Cardiology

## 2022-12-20 ENCOUNTER — Ambulatory Visit (HOSPITAL_COMMUNITY)
Admission: RE | Admit: 2022-12-20 | Discharge: 2022-12-20 | Disposition: A | Discharge status: Home / Self Care | Payer: BC Managed Care – PPO | Attending: Cardiology | Admitting: Cardiology

## 2022-12-20 ENCOUNTER — Other Ambulatory Visit (HOSPITAL_COMMUNITY): Payer: Self-pay

## 2022-12-20 ENCOUNTER — Ambulatory Visit (HOSPITAL_COMMUNITY): Payer: BC Managed Care – PPO | Admitting: Anesthesiology

## 2022-12-20 ENCOUNTER — Other Ambulatory Visit: Payer: Self-pay

## 2022-12-20 DIAGNOSIS — Z7901 Long term (current) use of anticoagulants: Secondary | ICD-10-CM | POA: Diagnosis not present

## 2022-12-20 DIAGNOSIS — I10 Essential (primary) hypertension: Secondary | ICD-10-CM | POA: Insufficient documentation

## 2022-12-20 DIAGNOSIS — I4819 Other persistent atrial fibrillation: Secondary | ICD-10-CM | POA: Diagnosis present

## 2022-12-20 HISTORY — PX: ATRIAL FIBRILLATION ABLATION: EP1191

## 2022-12-20 SURGERY — ATRIAL FIBRILLATION ABLATION
Anesthesia: General

## 2022-12-20 MED ORDER — ATROPINE SULFATE 1 MG/ML IV SOLN
INTRAVENOUS | Status: DC | PRN
  Administered 2022-12-20: 1 mg via INTRAVENOUS

## 2022-12-20 MED ORDER — HEPARIN SODIUM (PORCINE) 1000 UNIT/ML IJ SOLN
INTRAMUSCULAR | Status: AC
  Filled 2022-12-20: qty 20

## 2022-12-20 MED ORDER — PANTOPRAZOLE SODIUM 40 MG PO TBEC
40.0000 mg | DELAYED_RELEASE_TABLET | Freq: Every day | ORAL | Status: DC
  Administered 2022-12-20: 40 mg via ORAL
  Filled 2022-12-20 (×2): qty 1

## 2022-12-20 MED ORDER — ATROPINE SULFATE 1 MG/10ML IJ SOSY
PREFILLED_SYRINGE | INTRAMUSCULAR | Status: AC
  Filled 2022-12-20: qty 10

## 2022-12-20 MED ORDER — MIDAZOLAM HCL 2 MG/2ML IJ SOLN
INTRAMUSCULAR | Status: DC | PRN
  Administered 2022-12-20: 2 mg via INTRAVENOUS

## 2022-12-20 MED ORDER — APIXABAN 5 MG PO TABS
5.0000 mg | ORAL_TABLET | Freq: Two times a day (BID) | ORAL | Status: DC
  Administered 2022-12-20: 5 mg via ORAL
  Filled 2022-12-20 (×2): qty 1

## 2022-12-20 MED ORDER — PANTOPRAZOLE SODIUM 40 MG PO TBEC
40.0000 mg | DELAYED_RELEASE_TABLET | Freq: Every day | ORAL | 0 refills | Status: DC
  Filled 2022-12-20: qty 45, 45d supply, fill #0

## 2022-12-20 MED ORDER — COLCHICINE 0.6 MG PO TABS
0.6000 mg | ORAL_TABLET | Freq: Two times a day (BID) | ORAL | Status: DC
  Administered 2022-12-20: 0.6 mg via ORAL
  Filled 2022-12-20 (×2): qty 1

## 2022-12-20 MED ORDER — ONDANSETRON HCL 4 MG/2ML IJ SOLN: 4.0000 mg | Freq: Four times a day (QID) | INTRAMUSCULAR | Status: DC | PRN

## 2022-12-20 MED ORDER — PROPOFOL 10 MG/ML IV BOLUS
INTRAVENOUS | Status: DC | PRN
  Administered 2022-12-20: 30 mg via INTRAVENOUS
  Administered 2022-12-20: 170 mg via INTRAVENOUS

## 2022-12-20 MED ORDER — HEPARIN SODIUM (PORCINE) 1000 UNIT/ML IJ SOLN
INTRAMUSCULAR | Status: DC | PRN
  Administered 2022-12-20: 2000 [IU] via INTRAVENOUS
  Administered 2022-12-20: 15000 [IU] via INTRAVENOUS

## 2022-12-20 MED ORDER — HEPARIN (PORCINE) IN NACL 1000-0.9 UT/500ML-% IV SOLN
INTRAVENOUS | Status: DC | PRN
  Administered 2022-12-20 (×4): 500 mL

## 2022-12-20 MED ORDER — SUGAMMADEX SODIUM 200 MG/2ML IV SOLN
INTRAVENOUS | Status: DC | PRN
  Administered 2022-12-20: 200 mg via INTRAVENOUS

## 2022-12-20 MED ORDER — FENTANYL CITRATE (PF) 250 MCG/5ML IJ SOLN
INTRAMUSCULAR | Status: DC | PRN
  Administered 2022-12-20: 50 ug via INTRAVENOUS
  Administered 2022-12-20: 100 ug via INTRAVENOUS

## 2022-12-20 MED ORDER — DEXAMETHASONE SODIUM PHOSPHATE 10 MG/ML IJ SOLN
INTRAMUSCULAR | Status: DC | PRN
  Administered 2022-12-20: 10 mg via INTRAVENOUS

## 2022-12-20 MED ORDER — SODIUM CHLORIDE 0.9% FLUSH: 3.0000 mL | INTRAVENOUS | Status: DC | PRN

## 2022-12-20 MED ORDER — FENTANYL CITRATE (PF) 100 MCG/2ML IJ SOLN
INTRAMUSCULAR | Status: AC
  Filled 2022-12-20: qty 2

## 2022-12-20 MED ORDER — SODIUM CHLORIDE 0.9% FLUSH: 3.0000 mL | Freq: Two times a day (BID) | INTRAVENOUS | Status: DC

## 2022-12-20 MED ORDER — PROTAMINE SULFATE 10 MG/ML IV SOLN
INTRAVENOUS | Status: DC | PRN
  Administered 2022-12-20: 40 mg via INTRAVENOUS

## 2022-12-20 MED ORDER — ONDANSETRON HCL 4 MG/2ML IJ SOLN
INTRAMUSCULAR | Status: DC | PRN
Start: 2022-12-20 — End: 2022-12-20
  Administered 2022-12-20: 4 mg via INTRAVENOUS

## 2022-12-20 MED ORDER — LIDOCAINE 2% (20 MG/ML) 5 ML SYRINGE
INTRAMUSCULAR | Status: DC | PRN
  Administered 2022-12-20: 60 mg via INTRAVENOUS

## 2022-12-20 MED ORDER — COLCHICINE 0.6 MG PO TABS
0.6000 mg | ORAL_TABLET | Freq: Two times a day (BID) | ORAL | 0 refills | Status: DC
  Filled 2022-12-20: qty 10, 5d supply, fill #0

## 2022-12-20 MED ORDER — PHENYLEPHRINE HCL-NACL 20-0.9 MG/250ML-% IV SOLN
INTRAVENOUS | Status: DC | PRN
  Administered 2022-12-20: 20 ug/min via INTRAVENOUS

## 2022-12-20 MED ORDER — PHENYLEPHRINE 80 MCG/ML (10ML) SYRINGE FOR IV PUSH (FOR BLOOD PRESSURE SUPPORT)
PREFILLED_SYRINGE | INTRAVENOUS | Status: DC | PRN
  Administered 2022-12-20 (×2): 40 ug via INTRAVENOUS

## 2022-12-20 MED ORDER — SODIUM CHLORIDE 0.9 % IV SOLN: INTRAVENOUS | Status: DC

## 2022-12-20 MED ORDER — ROCURONIUM BROMIDE 10 MG/ML (PF) SYRINGE
PREFILLED_SYRINGE | INTRAVENOUS | Status: DC | PRN
  Administered 2022-12-20: 50 mg via INTRAVENOUS

## 2022-12-20 MED ORDER — ACETAMINOPHEN 325 MG PO TABS: 650.0000 mg | ORAL_TABLET | ORAL | Status: DC | PRN

## 2022-12-20 MED ORDER — MIDAZOLAM HCL 5 MG/5ML IJ SOLN
INTRAMUSCULAR | Status: AC
  Filled 2022-12-20: qty 5

## 2022-12-20 MED ORDER — SODIUM CHLORIDE 0.9 % IV SOLN: 250.0000 mL | INTRAVENOUS | Status: DC | PRN

## 2022-12-20 SURGICAL SUPPLY — 21 items
BAG SNAP BAND KOVER 36X36 (MISCELLANEOUS) IMPLANT
BLANKET WARM UNDERBOD FULL ACC (MISCELLANEOUS) ×1 IMPLANT
CABLE PFA RX CATH CONN (CABLE) IMPLANT
CATH FARAWAVE ABLATION 31 (CATHETERS) IMPLANT
CATH OCTARAY 2.0 F 3-3-3-3-3 (CATHETERS) IMPLANT
CATH SOUNDSTAR ECO 8FR (CATHETERS) IMPLANT
CATH WEBSTER BI DIR CS D-F CRV (CATHETERS) IMPLANT
CLOSURE PERCLOSE PROSTYLE (VASCULAR PRODUCTS) IMPLANT
COVER SWIFTLINK CONNECTOR (BAG) ×1 IMPLANT
DILATOR VESSEL 38 20CM 12FR (INTRODUCER) IMPLANT
GUIDEWIRE INQWIRE 1.5J.035X260 (WIRE) IMPLANT
INQWIRE 1.5J .035X260CM (WIRE) ×1
PACK EP LATEX FREE (CUSTOM PROCEDURE TRAY) ×1
PACK EP LF (CUSTOM PROCEDURE TRAY) ×1 IMPLANT
PAD DEFIB RADIO PHYSIO CONN (PAD) ×1 IMPLANT
SHEATH FARADRIVE STEERABLE (SHEATH) IMPLANT
SHEATH PINNACLE 7F 10CM (SHEATH) IMPLANT
SHEATH PINNACLE 8F 10CM (SHEATH) IMPLANT
SHEATH PINNACLE 9F 10CM (SHEATH) IMPLANT
SHEATH PROBE COVER 6X72 (BAG) IMPLANT
SHEATH WIRE KIT BAYLIS SL1 (KITS) IMPLANT

## 2022-12-20 NOTE — Anesthesia Postprocedure Evaluation (Signed)
Anesthesia Post Note  Patient: Samuel Elliott.  Procedure(s) Performed: ATRIAL FIBRILLATION ABLATION     Patient location during evaluation: Cath Lab Anesthesia Type: General Level of consciousness: awake and alert Pain management: pain level controlled Vital Signs Assessment: post-procedure vital signs reviewed and stable Respiratory status: spontaneous breathing, nonlabored ventilation, respiratory function stable and patient connected to nasal cannula oxygen Cardiovascular status: blood pressure returned to baseline and stable Postop Assessment: no apparent nausea or vomiting Anesthetic complications: no   No notable events documented.  Last Vitals:  Vitals:   12/20/22 1130 12/20/22 1200  BP: 110/69 126/80  Pulse: 66 71  Resp: (!) 0 17  Temp:    SpO2: 93% 92%    Last Pain:  Vitals:   12/20/22 1030  TempSrc: Temporal  PainSc: 0-No pain                 Savon Cobbs S

## 2022-12-20 NOTE — Transfer of Care (Signed)
Immediate Anesthesia Transfer of Care Note  Patient: Samuel Elliott.  Procedure(s) Performed: ATRIAL FIBRILLATION ABLATION  Patient Location: PACU  Anesthesia Type:General  Level of Consciousness: awake and alert   Airway & Oxygen Therapy: Patient Spontanous Breathing and Patient connected to nasal cannula oxygen  Post-op Assessment: Report given to RN and Post -op Vital signs reviewed and stable  Post vital signs: Reviewed and stable  Last Vitals:  Vitals Value Taken Time  BP 109/75 12/20/22 1001  Temp    Pulse 72 12/20/22 1002  Resp 12 12/20/22 1002  SpO2 87 % 12/20/22 1002  Vitals shown include unfiled device data.  Last Pain:  Vitals:   12/20/22 0541  TempSrc:   PainSc: 0-No pain         Complications: No notable events documented.

## 2022-12-20 NOTE — Anesthesia Procedure Notes (Signed)
Procedure Name: Intubation Date/Time: 12/20/2022 8:00 AM  Performed by: Lannie Fields, DOPre-anesthesia Checklist: Patient identified, Emergency Drugs available, Suction available and Patient being monitored Patient Re-evaluated:Patient Re-evaluated prior to induction Oxygen Delivery Method: Circle system utilized Preoxygenation: Pre-oxygenation with 100% oxygen Induction Type: IV induction Ventilation: Mask ventilation without difficulty Laryngoscope Size: Mac and 3 Grade View: Grade I Tube type: Oral Tube size: 8.0 mm Number of attempts: 1 Airway Equipment and Method: Stylet and Oral airway Placement Confirmation: ETT inserted through vocal cords under direct vision, positive ETCO2 and breath sounds checked- equal and bilateral Secured at: 22 cm Tube secured with: Tape Dental Injury: Teeth and Oropharynx as per pre-operative assessment

## 2022-12-20 NOTE — Anesthesia Preprocedure Evaluation (Signed)
Anesthesia Evaluation  Patient identified by MRN, date of birth, ID band Patient awake    Reviewed: Allergy & Precautions, H&P , NPO status , Patient's Chart, lab work & pertinent test results  Airway Mallampati: II   Neck ROM: full    Dental   Pulmonary sleep apnea    breath sounds clear to auscultation       Cardiovascular hypertension, + dysrhythmias Atrial Fibrillation  Rhythm:irregular Rate:Normal     Neuro/Psych    GI/Hepatic   Endo/Other    Renal/GU      Musculoskeletal   Abdominal   Peds  Hematology   Anesthesia Other Findings   Reproductive/Obstetrics                             Anesthesia Physical Anesthesia Plan  ASA: 3  Anesthesia Plan: General   Post-op Pain Management:    Induction: Intravenous  PONV Risk Score and Plan: 2 and Ondansetron, Dexamethasone, Midazolam and Treatment may vary due to age or medical condition  Airway Management Planned: Oral ETT  Additional Equipment:   Intra-op Plan:   Post-operative Plan: Extubation in OR  Informed Consent: I have reviewed the patients History and Physical, chart, labs and discussed the procedure including the risks, benefits and alternatives for the proposed anesthesia with the patient or authorized representative who has indicated his/her understanding and acceptance.     Dental advisory given  Plan Discussed with: CRNA, Anesthesiologist and Surgeon  Anesthesia Plan Comments:        Anesthesia Quick Evaluation

## 2022-12-20 NOTE — Addendum Note (Signed)
Addendum  created 12/20/22 1335 by Achille Rich, MD   Flowsheet accepted

## 2022-12-20 NOTE — H&P (Signed)
Electrophysiology Office Follow up Visit Note:     Date:  12/20/2022    ID:  Samuel Elliott., DOB February 15, 1966, MRN [id number]   PCP:  Dale Fosston, MD      [facility] Cardiologist:  Debbe Odea, MD  [facility] Electrophysiologist:  Lanier Prude, MD      Interval History:     Samuel Elliott. is a 57 y.o. male who presents for a follow up visit.    He had a DCCV 06/19/2022 in the EP lab using 360J.  I last saw the patient in clinic 06/13/2022.   Since I last saw him he has been maintaining normal rhythm.  His blood pressures have been elevated on his home checks despite treatment with carvedilol and lisinopril.  Presents for PVI today.      Objective Past medical, surgical, social and family history were reviewed.   ROS:   Please see the history of present illness.    All other systems reviewed and are negative.   EKGs/Labs/Other Studies Reviewed:     The following studies were reviewed today:   EKG:  The ekg ordered today demonstrates sinus rhythm     Physical Exam:     VS:  BP 144/83   Pulse 80   Ht 5\' 9"  (1.753 m)   Wt 232 lb (105.2 kg)   SpO2 96%   BMI 34.26 kg/m         Wt Readings from Last 3 Encounters:  08/08/22 232 lb (105.2 kg)  07/26/22 226 lb (102.5 kg)  06/19/22 225 lb (102.1 kg)      GEN:  Well nourished, well developed in no acute distress CARDIAC: RRR, no murmurs, rubs, gallops RESPIRATORY:  Clear to auscultation without rales, wheezing or rhonchi          Assessment ASSESSMENT:     1. Persistent atrial fibrillation   2. Primary hypertension     PLAN:     In order of problems listed above:     #Persistent AF S/p DCCV 06/19/2022 using a 360J defib. On eliquis for stroke ppx. Maintaining normal rhythm.  The patient has a long-term goal of avoiding medications including his anticoagulant.  We discussed the possibility of using a loop recorder for 24/7 rhythm surveillance and stopping his  anticoagulant.  I would wait to do this until at least 6 months from now to make sure he maintains normal rhythm.  This could also be discussed for after an ablation if he decides to go that route.   We discussed options for maintaining normal rhythm today including continue with a conservative/watchful waiting approach, adding an antiarrhythmic or proceeding with catheter ablation.  He is most interested in catheter ablation and will think about this further after he leaves today and let us know how he would like to proceed.   Discussed treatment options today for AF including antiarrhythmic drug therapy and ablation. Discussed risks, recovery and likelihood of success with each treatment strategy. Risk, benefits, and alternatives to EP study and ablation for afib were discussed. These risks include but are not limited to stroke, bleeding, vascular damage, tamponade, perforation, damage to the esophagus, lungs, phrenic nerve and other structures, pulmonary vein stenosis, worsening renal function, coronary vasospasm and death.  Discussed potential need for repeat ablation procedures and antiarrhythmic drugs after an initial ablation. The patient understands these risk and wishes to proceed.  We will therefore proceed with catheter ablation at the next available time.  Carto,  ICE, anesthesia are requested for the procedure.  Will also obtain CT PV protocol prior to the procedure to exclude LAA thrombus and further evaluate atrial anatomy.     Presents for PVI today. Procedure reviewed.   Signed, Steffanie Dunn, MD, [facility], [facility] 12/20/2022 Electrophysiology [facility]

## 2022-12-20 NOTE — Discharge Instructions (Signed)

## 2022-12-20 NOTE — Progress Notes (Signed)
Up and walked and tolerated well; bilat groins stable, no bleeding or hematoma 

## 2022-12-21 ENCOUNTER — Encounter (HOSPITAL_COMMUNITY): Payer: Self-pay | Admitting: Cardiology

## 2022-12-21 LAB — POCT ACTIVATED CLOTTING TIME
Activated Clotting Time: 311 s
Activated Clotting Time: 317 s

## 2022-12-21 MED FILL — Midazolam HCl Inj 5 MG/5ML (Base Equivalent): INTRAMUSCULAR | Qty: 2 | Status: AC

## 2022-12-21 MED FILL — Fentanyl Citrate Preservative Free (PF) Inj 100 MCG/2ML: INTRAMUSCULAR | Qty: 3 | Status: AC

## 2023-01-17 ENCOUNTER — Ambulatory Visit (HOSPITAL_COMMUNITY)
Admit: 2023-01-17 | Discharge: 2023-01-17 | Disposition: A | Discharge status: Home / Self Care | Payer: BC Managed Care – PPO | Source: Ambulatory Visit | Attending: Internal Medicine | Admitting: Internal Medicine

## 2023-01-17 VITALS — BP 128/80 | HR 57 | Ht 69.0 in | Wt 226.0 lb

## 2023-01-17 DIAGNOSIS — R001 Bradycardia, unspecified: Secondary | ICD-10-CM | POA: Diagnosis not present

## 2023-01-17 DIAGNOSIS — G4733 Obstructive sleep apnea (adult) (pediatric): Secondary | ICD-10-CM | POA: Diagnosis not present

## 2023-01-17 DIAGNOSIS — I4891 Unspecified atrial fibrillation: Secondary | ICD-10-CM

## 2023-01-17 DIAGNOSIS — I10 Essential (primary) hypertension: Secondary | ICD-10-CM | POA: Diagnosis not present

## 2023-01-17 DIAGNOSIS — I4819 Other persistent atrial fibrillation: Secondary | ICD-10-CM | POA: Diagnosis present

## 2023-01-17 DIAGNOSIS — Z7901 Long term (current) use of anticoagulants: Secondary | ICD-10-CM | POA: Diagnosis not present

## 2023-01-17 NOTE — Progress Notes (Signed)
Primary Care Physician: Dale Bolivar, MD Primary Cardiologist: Debbe Odea, MD Electrophysiologist: Lanier Prude, MD     Referring Physician: Dr. Geralynn Rile Tanav Pettijohn. is a 57 y.o. male with a history of OSA, HTN, HLD, and persistent atrial fibrillation who presents for consultation in the [facility].  He had a DCCV 06/19/22 in the EP lab using 360 J defibrillator. S/p Afib ablation on 12/20/22 by Dr. Lalla Brothers. Coronary calcium score of zero. Patient is on Eliquis for a CHADS2VASC score of 1.  On evaluation today, he is currently in NSR. No episodes of Afib since ablation. No chest pain, SOB, or trouble swallowing. Leg sites healed without issue. No missed doses of anticoagulant.  Today, he denies symptoms of orthopnea, PND, lower extremity edema, dizziness, presyncope, syncope, snoring, daytime somnolence, bleeding, or neurologic sequela. The patient is tolerating medications without difficulties and is otherwise without complaint today.    he has a BMI of Body mass index is 33.37 kg/m.Marland Kitchen Filed Weights   01/17/23 1107  Weight: 102.5 kg    Current Outpatient Medications  Medication Sig Dispense Refill   apixaban (ELIQUIS) 5 MG TABS tablet Take 1 tablet (5 mg total) by mouth 2 (two) times daily. 180 tablet 3   carvedilol (COREG) 6.25 MG tablet Take 1 tablet (6.25 mg total) by mouth 2 (two) times daily with a meal. 180 tablet 3   hydrochlorothiazide (MICROZIDE) 12.5 MG capsule Take 1 capsule (12.5 mg total) by mouth daily. 90 capsule 3   lisinopril (ZESTRIL) 40 MG tablet Take 1 tablet (40 mg total) by mouth daily. 90 tablet 3   pantoprazole (PROTONIX) 40 MG tablet Take 1 tablet (40 mg total) by mouth daily. 45 tablet 0   rosuvastatin (CRESTOR) 20 MG tablet Take 1 tablet (20 mg total) by mouth daily. 90 tablet 3   No current facility-administered medications for this encounter.    Atrial Fibrillation Management  history:  Previous antiarrhythmic drugs: None Previous cardioversions: 06/19/22 Previous ablations: 12/20/22 Anticoagulation history: Eliquis   ROS- All systems are reviewed and negative except as per the HPI above.  Physical Exam: BP 128/80   Pulse (!) 57   Ht 5\' 9"  (1.753 m)   Wt 102.5 kg   BMI 33.37 kg/m   GEN: Well nourished, well developed in no acute distress NECK: No JVD; No carotid bruits CARDIAC: Regular rate and rhythm, no murmurs, rubs, gallops RESPIRATORY:  Clear to auscultation without rales, wheezing or rhonchi  ABDOMEN: Soft, non-tender, non-distended EXTREMITIES:  No edema; No deformity   EKG today demonstrates  Vent. rate 57 BPM PR interval 168 ms QRS duration 86 ms QT/QTcB 410/399 ms P-R-T axes 41 39 56 Sinus bradycardia Otherwise normal ECG When compared with ECG of 20-Dec-2022 10:08, PREVIOUS ECG IS PRESENT  Echo 06/21/22 demonstrated  1. Left ventricular ejection fraction, by estimation, is 60 to 65%. The  left ventricle has normal function. The left ventricle has no regional  wall motion abnormalities. There is moderate asymmetric left ventricular  hypertrophy of the basal-septal  segment. Left ventricular diastolic parameters were normal. The average  left ventricular global longitudinal strain is -19.9 %.   2. Right ventricular systolic function is normal. The right ventricular  size is normal. There is normal pulmonary artery systolic pressure. The  estimated right ventricular systolic pressure is 33.3 mmHg.   3. Left atrial size was mildly dilated.   4. The mitral valve is normal in structure.  Mild mitral valve  regurgitation. No evidence of mitral stenosis.   5. The aortic valve has an indeterminant number of cusps. Aortic valve  regurgitation is not visualized. No aortic stenosis is present.   6. The inferior vena cava is normal in size with greater than 50%  respiratory variability, suggesting right atrial pressure of 3 mmHg.    ASSESSMENT & PLAN CHA2DS2-VASc Score = 1  The patient's score is based upon: CHF History: 0 HTN History: 1 Diabetes History: 0 Stroke History: 0 Vascular Disease History: 0 Age Score: 0 Gender Score: 0       ASSESSMENT AND PLAN: Persistent Atrial Fibrillation (ICD10:  I48.19) The patient's CHA2DS2-VASc score is 1, indicating a 0.6% annual risk of stroke.   S/p Afib ablation on 12/20/22 by Dr. Lalla Brothers.   He is currently in NSR. Continue Coreg 6.25 mg BID. Continue Eliquis 5 mg BID without interruption.     Follow up as scheduled with Dr. Lalla Brothers.    Lake Bells, PA-C  Afib Clinic [facility] [address] [place], [place] [address] [phone]

## 2023-03-07 ENCOUNTER — Other Ambulatory Visit: Payer: Self-pay

## 2023-03-26 NOTE — Progress Notes (Unsigned)
Electrophysiology Office Follow up Visit Note:    Date:  03/27/2023   ID:  Samuel Obey., DOB [birth year], MRN [id number]  PCP:  Samuel Weldona, MD  [facility] Cardiologist:  Samuel Odea, MD  [facility] Electrophysiologist:  Samuel Prude, MD    Interval History:     Samuel Rybicki. is a 57 y.o. male who presents for a follow up visit.   The patient had an A-fib ablation on December 20, 2022 during which the veins and posterior wall were isolated.  The patient was seen in the A-fib clinic on January 17, 2023 and was maintaining sinus rhythm.  Discussed the use of AI scribe software for clinical note transcription with the patient, who gave verbal consent to proceed.  History of Present Illness   The patient, with a history of atrial fibrillation, presents for a post-ablation follow-up. He reports feeling well and has been monitoring his heart rhythm at home, with no episodes of atrial fibrillation noted. He has been active, raking leaves and picking up sticks, but notes that he starts to feel tired and thirsty after about two hours of this activity. He has not experienced any issues with the groin sites post-procedure. The patient also mentions that his prescription for Eliquis is about to run out. He has concerns about receiving the flu and COVID-19 vaccines due to advice received from acquaintances.            Past medical, surgical, social and family history were reviewed.  ROS:   Please see the history of present illness.    All other systems reviewed and are negative.  EKGs/Labs/Other Studies Reviewed:    The following studies were reviewed today:     EKG Interpretation Date/Time:  Wednesday March 27 2023 09:38:19 EST Ventricular Rate:  61 PR Interval:  142 QRS Duration:  86 QT Interval:  410 QTC Calculation: 412 R Axis:   41  Text Interpretation: Normal sinus rhythm Confirmed by Samuel Elliott [phone]) on 03/27/2023  10:18:18 AM    Physical Exam:    VS:  BP 118/68 (BP Location: Left Arm, Patient Position: Sitting, Cuff Size: Large)   Pulse 61   Ht 5\' 9"  (1.753 m)   Wt 224 lb 12.8 oz (102 kg)   SpO2 96%   BMI 33.20 kg/m     Wt Readings from Last 3 Encounters:  03/27/23 224 lb 12.8 oz (102 kg)  01/17/23 226 lb (102.5 kg)  12/20/22 220 lb (99.8 kg)    GEN: well appearing, no distress CARD: RRR, no MRG RESP: No IWOB. CTAB.      ASSESSMENT:    1. Persistent atrial fibrillation (HCC)   2. Primary hypertension    PLAN:    In order of problems listed above:  Assessment and Plan    Atrial Fibrillation Post-ablation, no recurrence of AFib noted on patient's home monitoring. Patient is currently on Eliquis with no reported side effects. -Continue Eliquis until 37-month mark post-ablation (June 22, 2023). -After June 22, 2023, stop Eliquis and start Aspirin 81mg  daily. -Continue home monitoring of heart rhythm a few times a week. -Refill Eliquis prescription to last until June 22, 2023.  Hypertension Well controlled with current regimen. -Continue current antihypertensive regimen.  General Health Maintenance -Endorsed receiving COVID and flu vaccines. Discussed potential for transient arrhythmias post-vaccination but emphasized the overall benefit of vaccination. -Follow-up in 1 year.          Follow up 1 year with APP.  Signed, Samuel Dunn, MD, [facility], [facility] 03/27/2023 10:26 AM    Electrophysiology [facility]

## 2023-03-27 ENCOUNTER — Encounter: Payer: Self-pay | Admitting: Cardiology

## 2023-03-27 ENCOUNTER — Ambulatory Visit: Payer: BC Managed Care – PPO | Admitting: Cardiology

## 2023-03-27 ENCOUNTER — Ambulatory Visit: Payer: BC Managed Care – PPO | Attending: Cardiology | Admitting: Cardiology

## 2023-03-27 VITALS — BP 118/68 | HR 61 | Ht 69.0 in | Wt 224.8 lb

## 2023-03-27 DIAGNOSIS — I10 Essential (primary) hypertension: Secondary | ICD-10-CM

## 2023-03-27 DIAGNOSIS — I4819 Other persistent atrial fibrillation: Secondary | ICD-10-CM

## 2023-03-27 MED ORDER — APIXABAN 5 MG PO TABS: 5.0000 mg | ORAL_TABLET | Freq: Two times a day (BID) | ORAL | 0 refills | Status: DC

## 2023-03-27 NOTE — Patient Instructions (Signed)
Medication Instructions:  Your physician has recommended you make the following change in your medication:  1) On February 22nd, 2025 STOP taking Eliquis and START taking aspirin 81 mg daily  *If you need a refill on  your cardiac medications before your next appointment, please call your pharmacy*  Follow-Up: At [facility], you and your health needs are our priority.  As part of our continuing mission to provide you with exceptional heart care, we have created designated Provider Care Teams.  These Care Teams include your primary Cardiologist (physician) and Advanced Practice Providers (APPs -  Physician Assistants and Nurse Practitioners) who all work together to provide you with the care you need, when you need it.   Your next appointment:   1 year  Provider:   You will see one of the following Advanced Practice Providers on your designated Care Team:   Francis Dowse, Charlott Holler [address] [place], [place] Sherie Don, NP Canary Brim, NP

## 2023-04-05 ENCOUNTER — Ambulatory Visit: Payer: BC Managed Care – PPO | Admitting: Internal Medicine

## 2023-04-05 ENCOUNTER — Encounter: Payer: Self-pay | Admitting: Internal Medicine

## 2023-04-05 VITALS — BP 114/72 | HR 62 | Temp 98.0°F | Resp 16 | Ht 69.0 in | Wt 224.8 lb

## 2023-04-05 DIAGNOSIS — G4733 Obstructive sleep apnea (adult) (pediatric): Secondary | ICD-10-CM

## 2023-04-05 DIAGNOSIS — E7800 Pure hypercholesterolemia, unspecified: Secondary | ICD-10-CM

## 2023-04-05 DIAGNOSIS — I4891 Unspecified atrial fibrillation: Secondary | ICD-10-CM

## 2023-04-05 DIAGNOSIS — I10 Essential (primary) hypertension: Secondary | ICD-10-CM

## 2023-04-05 DIAGNOSIS — Z0000 Encounter for general adult medical examination without abnormal findings: Secondary | ICD-10-CM

## 2023-04-05 DIAGNOSIS — Z125 Encounter for screening for malignant neoplasm of prostate: Secondary | ICD-10-CM

## 2023-04-05 DIAGNOSIS — Z1211 Encounter for screening for malignant neoplasm of colon: Secondary | ICD-10-CM

## 2023-04-05 DIAGNOSIS — E041 Nontoxic single thyroid nodule: Secondary | ICD-10-CM | POA: Diagnosis not present

## 2023-04-05 DIAGNOSIS — E559 Vitamin D deficiency, unspecified: Secondary | ICD-10-CM

## 2023-04-05 LAB — LIPID PANEL
Cholesterol: 152 mg/dL (ref 0–200)
HDL: 39.6 mg/dL (ref >39.00)
LDL Cholesterol: 93 mg/dL (ref 0–99)
NonHDL: 112.09
Total CHOL/HDL Ratio: 4
Triglycerides: 94 mg/dL (ref 0.0–149.0)
VLDL: 18.8 mg/dL (ref 0.0–40.0)

## 2023-04-05 LAB — BASIC METABOLIC PANEL
BUN: 12 mg/dL (ref 6–23)
CO2: 33 meq/L — ABNORMAL HIGH (ref 19–32)
Calcium: 10.5 mg/dL (ref 8.4–10.5)
Chloride: 103 meq/L (ref 96–112)
Creatinine, Ser: 1.14 mg/dL (ref 0.40–1.50)
GFR: 70.73 mL/min (ref >60.00)
Glucose, Bld: 100 mg/dL — ABNORMAL HIGH (ref 70–99)
Potassium: 4.4 meq/L (ref 3.5–5.1)
Sodium: 142 meq/L (ref 135–145)

## 2023-04-05 LAB — HEPATIC FUNCTION PANEL
ALT: 21 U/L (ref 0–53)
AST: 18 U/L (ref 0–37)
Albumin: 4.1 g/dL (ref 3.5–5.2)
Alkaline Phosphatase: 86 U/L (ref 39–117)
Bilirubin, Direct: 0.1 mg/dL (ref 0.0–0.3)
Total Bilirubin: 0.6 mg/dL (ref 0.2–1.2)
Total Protein: 6.9 g/dL (ref 6.0–8.3)

## 2023-04-05 LAB — VITAMIN D 25 HYDROXY (VIT D DEFICIENCY, FRACTURES): VITD: 26.61 ng/mL — ABNORMAL LOW (ref 30.00–100.00)

## 2023-04-05 LAB — PSA: PSA: 0.63 ng/mL (ref 0.10–4.00)

## 2023-04-05 LAB — TSH: TSH: 0.7 u[IU]/mL (ref 0.35–5.50)

## 2023-04-05 NOTE — Progress Notes (Signed)
Subjective:    Patient ID: Samuel Elliott., male    DOB: May 17, 1965, 57 y.o.   MRN: [id number]  Patient here for  Chief Complaint  Patient presents with   Annual Exam    HPI Here for a physical exam. Is s/p afib ablation 12/20/22. Had f/u with Dr Lalla Brothers 03/27/23. No recurrence of afib noted. Remains on eliquis. Recommended continuing eliquis until 6 months post ablation (06/2023). Also found to have sleep apnea.  Recommended auto cpap. Wants to hold on cpap. Feels sleeping well. Remains on coreg and hydrochlorothiazide for his blood pressure. Reviewed outside blood pressures. No chest pain or sob reported. No increased heart rate or palpitations.  No abdominal pain or bowel change noted.    Past Medical History:  Diagnosis Date   History of chicken pox    Hypercholesterolemia    Hypertension    Past Surgical History:  Procedure Laterality Date   ATRIAL FIBRILLATION ABLATION N/A 12/20/2022   Procedure: ATRIAL FIBRILLATION ABLATION;  Surgeon: Lanier Prude, MD;  Location: [facility];  Service: Cardiovascular;  Laterality: N/A;   CARDIOVERSION N/A 05/23/2022   Procedure: CARDIOVERSION;  Surgeon: Debbe Odea, MD;  Location: [facility] ORS;  Service: Cardiovascular;  Laterality: N/A;   CARDIOVERSION N/A 06/19/2022   Procedure: CARDIOVERSION;  Surgeon: Lanier Prude, MD;  Location: [facility] INVASIVE CV LAB;  Service: Cardiovascular;  Laterality: N/A;   TUMOR REMOVAL  7/08 & 5/14   left shoulder, lipoma   Family History  Problem Relation Age of Onset   Hyperlipidemia Mother    Hypertension Mother    Hyperlipidemia Father    Kidney cancer Father    Heart Problems Father        Afib   Hyperlipidemia Paternal Aunt    Colon cancer Paternal Uncle    Prostate cancer Paternal Uncle    Hyperlipidemia Paternal Uncle    Hyperlipidemia Paternal Grandmother    Hyperlipidemia Paternal Grandfather    Social History   Socioeconomic History   Marital status: Married     Spouse name: Not on file   Number of children: Not on file   Years of education: Not on file   Highest education level: Bachelor's degree (e.g., BA, AB, BS)  Occupational History   Not on file  Tobacco Use   Smoking status: Never    Passive exposure: Never   Smokeless tobacco: Never  Vaping Use   Vaping status: Never Used  Substance and Sexual Activity   Alcohol use: Yes    Alcohol/week: 0.0 standard drinks of alcohol    Comment: rarley   Drug use: No   Sexual activity: Not on file  Other Topics Concern   Not on file  Social History Narrative   Lives with Lupita Leash, wife, and pets (cat/dog).   Social Determinants of Health   Financial Resource Strain: Low Risk  (04/04/2023)   Overall Financial Resource Strain (CARDIA)    Difficulty of Paying Living Expenses: Not hard at all  Food Insecurity: No Food Insecurity (04/04/2023)   Hunger Vital Sign    Worried About Running Out of Food in the Last Year: Never true    Ran Out of Food in the Last Year: Never true  Transportation Needs: No Transportation Needs (04/04/2023)   PRAPARE - Administrator, Civil Service (Medical): No    Lack of Transportation (Non-Medical): No  Physical Activity: Insufficiently Active (04/04/2023)   Exercise Vital Sign    Days of Exercise per Week:  4 days    Minutes of Exercise per Session: 30 min  Stress: No Stress Concern Present (04/04/2023)   [employer] of Occupational Health - Occupational Stress Questionnaire    Feeling of Stress : Not at all  Social Connections: Socially Integrated (04/04/2023)   Social Connection and Isolation Panel [NHANES]    Frequency of Communication with Friends and Family: More than three times a week    Frequency of Social Gatherings with Friends and Family: More than three times a week    Attends Religious Services: More than 4 times per year    Active Member of [employer] or Organizations: Yes    Attends Banker Meetings: 1 to 4 times per year     Marital Status: Married     Review of Systems  Constitutional:  Negative for appetite change and unexpected weight change.  HENT:  Negative for congestion, sinus pressure and sore throat.   Eyes:  Negative for pain and visual disturbance.  Respiratory:  Negative for cough, chest tightness and shortness of breath.   Cardiovascular:  Negative for chest pain, palpitations and leg swelling.  Gastrointestinal:  Negative for abdominal pain, diarrhea, nausea and vomiting.  Genitourinary:  Negative for difficulty urinating and dysuria.  Musculoskeletal:  Negative for joint swelling and myalgias.  Skin:  Negative for color change and rash.  Neurological:  Negative for dizziness and headaches.  Hematological:  Negative for adenopathy. Does not bruise/bleed easily.  Psychiatric/Behavioral:  Negative for agitation and dysphoric mood.        Objective:     BP 114/72   Pulse 62   Temp 98 F (36.7 C)   Resp 16   Ht 5\' 9"  (1.753 m)   Wt 224 lb 12.8 oz (102 kg)   SpO2 98%   BMI 33.20 kg/m  Wt Readings from Last 3 Encounters:  04/05/23 224 lb 12.8 oz (102 kg)  03/27/23 224 lb 12.8 oz (102 kg)  01/17/23 226 lb (102.5 kg)    Physical Exam Constitutional:      General: He is not in acute distress.    Appearance: Normal appearance. He is well-developed.  HENT:     Head: Normocephalic and atraumatic.     Right Ear: External ear normal.     Left Ear: External ear normal.  Eyes:     General: No scleral icterus.       Right eye: No discharge.        Left eye: No discharge.     Conjunctiva/sclera: Conjunctivae normal.  Neck:     Thyroid: No thyromegaly.  Cardiovascular:     Rate and Rhythm: Normal rate and regular rhythm.  Pulmonary:     Effort: No respiratory distress.     Breath sounds: Normal breath sounds. No wheezing.  Abdominal:     General: Bowel sounds are normal.     Palpations: Abdomen is soft.     Tenderness: There is no abdominal tenderness.  Musculoskeletal:         General: No swelling or tenderness.     Cervical back: Neck supple. No tenderness.  Lymphadenopathy:     Cervical: No cervical adenopathy.  Skin:    Findings: No erythema or rash.  Neurological:     Mental Status: He is alert and oriented to person, place, and time.  Psychiatric:        Mood and Affect: Mood normal.        Behavior: Behavior normal.      Outpatient  Encounter Medications as of 04/05/2023  Medication Sig   apixaban (ELIQUIS) 5 MG TABS tablet Take 1 tablet (5 mg total) by mouth 2 (two) times daily.   carvedilol (COREG) 6.25 MG tablet Take 1 tablet (6.25 mg total) by mouth 2 (two) times daily with a meal.   hydrochlorothiazide (MICROZIDE) 12.5 MG capsule Take 1 capsule (12.5 mg total) by mouth daily.   lisinopril (ZESTRIL) 40 MG tablet Take 1 tablet (40 mg total) by mouth daily.   rosuvastatin (CRESTOR) 20 MG tablet Take 1 tablet (20 mg total) by mouth daily.   [DISCONTINUED] pantoprazole (PROTONIX) 40 MG tablet Take 1 tablet (40 mg total) by mouth daily.   No facility-administered encounter medications on file as of 04/05/2023.     Lab Results  Component Value Date   WBC 6.4 12/11/2022   HGB 15.0 12/11/2022   HCT 43.5 12/11/2022   PLT 280 12/11/2022   GLUCOSE 97 11/29/2022   CHOL 135 11/29/2022   TRIG 80.0 11/29/2022   HDL 46.00 11/29/2022   LDLDIRECT 166.3 05/08/2013   LDLCALC 73 11/29/2022   ALT 19 11/29/2022   AST 17 11/29/2022   NA 141 11/29/2022   K 4.2 11/29/2022   CL 104 11/29/2022   CREATININE 1.20 11/29/2022   BUN 12 11/29/2022   CO2 32 11/29/2022   TSH 0.74 03/21/2022   PSA 0.60 03/21/2022   HGBA1C 5.6 12/05/2016    No results found.     Assessment & Plan:  Routine general medical examination at a health care facility  Hypercholesterolemia Assessment & Plan: Continue crestor.  Low cholesterol diet and exercise.  Follow lipid panel and liver function tests.    Orders: -     Hepatic function panel -     Lipid panel  Atrial  fibrillation, unspecified type [facility]) Assessment & Plan: Recently diagnosed with afib. S/p ablation 12/20/22. Doing well.  Just saw Dr Lalla Brothers.  On eliquis.  Plans to continue through February. Will then start aspirin.  Follow.    Thyroid nodule Assessment & Plan: Saw Dr Tedd Sias.  S/p biopsies - ok.  Felt no further w/up warranted.  Follow.  TSH 02/2022 - wnl.   Orders: -     TSH  Essential hypertension, benign Assessment & Plan: Continues on coreg and hydrochlorothiazide.  Blood pressures doing well. No changes.  Check metabolic panel.   Orders: -     Basic metabolic panel  Health care maintenance Assessment & Plan: Physical today 04/05/23.   Colonoscopy 03/2015. - diverticulosis.  IFOB 07/19/21 - negative. IFOB today. Check psa.    Vitamin D deficiency Assessment & Plan: Check vitamin D level today.   Orders: -     VITAMIN D 25 Hydroxy (Vit-D Deficiency, Fractures)  Prostate cancer screening -     PSA  Colon cancer screening -     Fecal occult blood, imunochemical; Future  OSA (obstructive sleep apnea) Assessment & Plan: 06/17/22 - mild OSA (Dr Mayford Knife). Prefers to hold on cpap.  Feels sleeping well.  Follow.       Dale Cullman, MD

## 2023-04-05 NOTE — Assessment & Plan Note (Signed)
Continue crestor.  Low cholesterol diet and exercise.  Follow lipid panel and liver function tests.  

## 2023-04-05 NOTE — Assessment & Plan Note (Signed)
Check vitamin D level today 

## 2023-04-05 NOTE — Assessment & Plan Note (Signed)
Recently diagnosed with afib. S/p ablation 12/20/22. Doing well.  Just saw Dr Lalla Brothers.  On eliquis.  Plans to continue through February. Will then start aspirin.  Follow.

## 2023-04-05 NOTE — Assessment & Plan Note (Addendum)
Physical today 04/05/23.   Colonoscopy 03/2015. - diverticulosis.  IFOB 07/19/21 - negative. IFOB today. Check psa.

## 2023-04-05 NOTE — Assessment & Plan Note (Signed)
Saw Dr Solum.  S/p biopsies - ok.  Felt no further w/up warranted.  Follow.  TSH 02/2022 - wnl.  

## 2023-04-05 NOTE — Assessment & Plan Note (Signed)
06/17/22 - mild OSA (Dr Mayford Knife). Prefers to hold on cpap.  Feels sleeping well.  Follow.

## 2023-04-05 NOTE — Assessment & Plan Note (Signed)
Continues on coreg and hydrochlorothiazide.  Blood pressures doing well. No changes.  Check metabolic panel.

## 2023-04-08 ENCOUNTER — Encounter: Payer: Self-pay | Admitting: Internal Medicine

## 2023-04-09 NOTE — Telephone Encounter (Signed)
LM for patients wife to schedule new patient appt per Dr Lorin Picket,.

## 2023-04-10 NOTE — Telephone Encounter (Signed)
See note in wife's chart.

## 2023-04-18 ENCOUNTER — Other Ambulatory Visit: Payer: Self-pay | Admitting: *Deleted

## 2023-04-18 DIAGNOSIS — Z1211 Encounter for screening for malignant neoplasm of colon: Secondary | ICD-10-CM

## 2023-04-19 LAB — FECAL OCCULT BLOOD, IMMUNOCHEMICAL: Fecal Occult Bld: NEGATIVE

## 2023-06-21 MED ORDER — ASPIRIN 81 MG PO TBEC: 81.0000 mg | DELAYED_RELEASE_TABLET | Freq: Every day | ORAL | Status: AC

## 2023-08-06 ENCOUNTER — Other Ambulatory Visit: Payer: Self-pay | Admitting: Cardiology

## 2023-09-18 ENCOUNTER — Ambulatory Visit: Payer: Self-pay | Admitting: Cardiology

## 2023-10-01 ENCOUNTER — Other Ambulatory Visit: Payer: Self-pay

## 2023-10-01 DIAGNOSIS — E7800 Pure hypercholesterolemia, unspecified: Secondary | ICD-10-CM

## 2023-10-01 DIAGNOSIS — I10 Essential (primary) hypertension: Secondary | ICD-10-CM

## 2023-10-02 ENCOUNTER — Other Ambulatory Visit (INDEPENDENT_AMBULATORY_CARE_PROVIDER_SITE_OTHER): Payer: BC Managed Care – PPO

## 2023-10-02 DIAGNOSIS — E7800 Pure hypercholesterolemia, unspecified: Secondary | ICD-10-CM

## 2023-10-02 DIAGNOSIS — I10 Essential (primary) hypertension: Secondary | ICD-10-CM | POA: Diagnosis not present

## 2023-10-02 LAB — HEPATIC FUNCTION PANEL
ALT: 18 U/L (ref 0–53)
AST: 16 U/L (ref 0–37)
Albumin: 4.2 g/dL (ref 3.5–5.2)
Alkaline Phosphatase: 68 U/L (ref 39–117)
Bilirubin, Direct: 0.2 mg/dL (ref 0.0–0.3)
Total Bilirubin: 0.7 mg/dL (ref 0.2–1.2)
Total Protein: 7.1 g/dL (ref 6.0–8.3)

## 2023-10-02 LAB — CBC WITH DIFFERENTIAL/PLATELET
Basophils Absolute: 0.1 10*3/uL (ref 0.0–0.1)
Basophils Relative: 1.1 % (ref 0.0–3.0)
Eosinophils Absolute: 0.3 10*3/uL (ref 0.0–0.7)
Eosinophils Relative: 5.4 % — ABNORMAL HIGH (ref 0.0–5.0)
HCT: 43.5 % (ref 39.0–52.0)
Hemoglobin: 14.8 g/dL (ref 13.0–17.0)
Lymphocytes Relative: 23.5 % (ref 12.0–46.0)
Lymphs Abs: 1.4 10*3/uL (ref 0.7–4.0)
MCHC: 34 g/dL (ref 30.0–36.0)
MCV: 86.4 fl (ref 78.0–100.0)
Monocytes Absolute: 0.5 10*3/uL (ref 0.1–1.0)
Monocytes Relative: 8.3 % (ref 3.0–12.0)
Neutro Abs: 3.7 10*3/uL (ref 1.4–7.7)
Neutrophils Relative %: 61.7 % (ref 43.0–77.0)
Platelets: 285 10*3/uL (ref 150.0–400.0)
RBC: 5.04 Mil/uL (ref 4.22–5.81)
RDW: 13.3 % (ref 11.5–15.5)
WBC: 6 10*3/uL (ref 4.0–10.5)

## 2023-10-02 LAB — LIPID PANEL
Cholesterol: 138 mg/dL (ref 0–200)
HDL: 39.8 mg/dL (ref >39.00)
LDL Cholesterol: 78 mg/dL (ref 0–99)
NonHDL: 98.65
Total CHOL/HDL Ratio: 3
Triglycerides: 102 mg/dL (ref 0.0–149.0)
VLDL: 20.4 mg/dL (ref 0.0–40.0)

## 2023-10-02 LAB — BASIC METABOLIC PANEL WITH GFR
BUN: 21 mg/dL (ref 6–23)
CO2: 31 meq/L (ref 19–32)
Calcium: 10.6 mg/dL — ABNORMAL HIGH (ref 8.4–10.5)
Chloride: 105 meq/L (ref 96–112)
Creatinine, Ser: 1.24 mg/dL (ref 0.40–1.50)
GFR: 63.72 mL/min (ref >60.00)
Glucose, Bld: 106 mg/dL — ABNORMAL HIGH (ref 70–99)
Potassium: 4 meq/L (ref 3.5–5.1)
Sodium: 141 meq/L (ref 135–145)

## 2023-10-03 ENCOUNTER — Ambulatory Visit: Payer: Self-pay | Admitting: Internal Medicine

## 2023-10-04 ENCOUNTER — Encounter: Payer: Self-pay | Admitting: Internal Medicine

## 2023-10-04 ENCOUNTER — Ambulatory Visit: Payer: BC Managed Care – PPO | Admitting: Internal Medicine

## 2023-10-04 VITALS — BP 118/70 | HR 67 | Temp 97.9°F | Resp 16 | Ht 69.0 in | Wt 217.6 lb

## 2023-10-04 DIAGNOSIS — E7800 Pure hypercholesterolemia, unspecified: Secondary | ICD-10-CM

## 2023-10-04 DIAGNOSIS — I4891 Unspecified atrial fibrillation: Secondary | ICD-10-CM | POA: Diagnosis not present

## 2023-10-04 DIAGNOSIS — I10 Essential (primary) hypertension: Secondary | ICD-10-CM | POA: Diagnosis not present

## 2023-10-04 DIAGNOSIS — G4733 Obstructive sleep apnea (adult) (pediatric): Secondary | ICD-10-CM

## 2023-10-04 MED ORDER — CARVEDILOL 6.25 MG PO TABS: 6.2500 mg | ORAL_TABLET | Freq: Two times a day (BID) | ORAL | 3 refills | Status: AC

## 2023-10-04 MED ORDER — LISINOPRIL 40 MG PO TABS: 40.0000 mg | ORAL_TABLET | Freq: Every day | ORAL | 3 refills | Status: AC

## 2023-10-04 MED ORDER — ROSUVASTATIN CALCIUM 20 MG PO TABS: 20.0000 mg | ORAL_TABLET | Freq: Every day | ORAL | 3 refills | Status: AC

## 2023-10-04 NOTE — Progress Notes (Signed)
 Subjective:    Patient ID: Samuel Elliott., male    DOB: [birth year], 58 y.o.   MRN: [id number]  Patient here for  Chief Complaint  Patient presents with   Medical Management of Chronic Issues    HPI Here for a scheduled follow up - Is s/p afib ablation 12/20/22. Had f/u with Dr Marven Slimmer 03/27/23. No recurrence of afib noted. Remained on eliquis  until February. Started baby aspirin  after stopping eliquis . Recommended continuing eliquis  until 6 months post ablation (06/2023). Also found to have sleep apnea. Recommended auto cpap. Preferred to hold on starting cpap. Has not been using. Does not tolerate. Discussed the need to treat sleep apnea. Reports increased fatigue. Also intermittent dizziness. Remains on coreg  and hydrochlorothiazide  for his blood pressure. Reports feels worse after taking coreg . Fatigue. Decreased energy. Also reports some change in breathing for a couple of hours after taking coreg . When he is active, denies chest pain or sob. Denies increased heart rate or palpitations. Has fallen. No head injury. No LOC. No syncope or near syncope.    Past Medical History:  Diagnosis Date   History of chicken pox    Hypercholesterolemia    Hypertension    Past Surgical History:  Procedure Laterality Date   ATRIAL FIBRILLATION ABLATION N/A 12/20/2022   Procedure: ATRIAL FIBRILLATION ABLATION;  Surgeon: Boyce Byes, MD;  Location: [facility] INVASIVE CV LAB;  Service: Cardiovascular;  Laterality: N/A;   CARDIOVERSION N/A 05/23/2022   Procedure: CARDIOVERSION;  Surgeon: Constancia Delton, MD;  Location: [facility] ORS;  Service: Cardiovascular;  Laterality: N/A;   CARDIOVERSION N/A 06/19/2022   Procedure: CARDIOVERSION;  Surgeon: Boyce Byes, MD;  Location: [facility] INVASIVE CV LAB;  Service: Cardiovascular;  Laterality: N/A;   TUMOR REMOVAL  7/08 & 5/14   left shoulder, lipoma   Family History  Problem Relation Age of Onset   Hyperlipidemia Mother    Hypertension Mother     Hyperlipidemia Father    Kidney cancer Father    Heart Problems Father        Afib   Hyperlipidemia Paternal Aunt    Colon cancer Paternal Uncle    Prostate cancer Paternal Uncle    Hyperlipidemia Paternal Uncle    Hyperlipidemia Paternal Grandmother    Hyperlipidemia Paternal Grandfather    Social History   Socioeconomic History   Marital status: Married    Spouse name: Not on file   Number of children: Not on file   Years of education: Not on file   Highest education level: Bachelor's degree (e.g., BA, AB, BS)  Occupational History   Not on file  Tobacco Use   Smoking status: Never    Passive exposure: Never   Smokeless tobacco: Never  Vaping Use   Vaping status: Never Used  Substance and Sexual Activity   Alcohol use: Yes    Alcohol/week: 0.0 standard drinks of alcohol    Comment: rarley   Drug use: No   Sexual activity: Not on file  Other Topics Concern   Not on file  Social History Narrative   Lives with Abe Abed, wife, and pets (cat/dog).   Social Drivers of Corporate investment banker Strain: Low Risk  (09/30/2023)   Overall Financial Resource Strain (CARDIA)    Difficulty of Paying Living Expenses: Not hard at all  Food Insecurity: No Food Insecurity (09/30/2023)   Hunger Vital Sign    Worried About Running Out of Food in the Last Year: Never true    Ran  Out of Food in the Last Year: Never true  Transportation Needs: No Transportation Needs (09/30/2023)   PRAPARE - Administrator, Civil Service (Medical): No    Lack of Transportation (Non-Medical): No  Physical Activity: Sufficiently Active (09/30/2023)   Exercise Vital Sign    Days of Exercise per Week: 7 days    Minutes of Exercise per Session: 150+ min  Stress: No Stress Concern Present (09/30/2023)   [employer] of Occupational Health - Occupational Stress Questionnaire    Feeling of Stress : Not at all  Social Connections: Socially Integrated (09/30/2023)   Social Connection and Isolation  Panel [NHANES]    Frequency of Communication with Friends and Family: More than three times a week    Frequency of Social Gatherings with Friends and Family: More than three times a week    Attends Religious Services: More than 4 times per year    Active Member of [employer] or Organizations: Yes    Attends Engineer, structural: More than 4 times per year    Marital Status: Married     Review of Systems  Constitutional:  Positive for fatigue. Negative for appetite change and unexpected weight change.  HENT:  Negative for congestion and sinus pressure.   Respiratory:  Negative for cough and chest tightness.   Cardiovascular:  Negative for chest pain, palpitations and leg swelling.  Gastrointestinal:  Negative for abdominal pain, diarrhea, nausea and vomiting.  Genitourinary:  Negative for difficulty urinating and dysuria.  Musculoskeletal:  Negative for joint swelling and myalgias.  Skin:  Negative for color change and rash.  Neurological:  Negative for dizziness and headaches.  Psychiatric/Behavioral:  Negative for agitation and dysphoric mood.        Objective:     BP 118/70   Pulse 67   Temp 97.9 F (36.6 C)   Resp 16   Ht 5\' 9"  (1.753 m)   Wt 217 lb 9.6 oz (98.7 kg)   SpO2 98%   BMI 32.13 kg/m  Wt Readings from Last 3 Encounters:  10/04/23 217 lb 9.6 oz (98.7 kg)  04/05/23 224 lb 12.8 oz (102 kg)  03/27/23 224 lb 12.8 oz (102 kg)    Physical Exam Vitals reviewed.  Constitutional:      General: He is not in acute distress.    Appearance: Normal appearance. He is well-developed.  HENT:     Head: Normocephalic and atraumatic.     Right Ear: External ear normal.     Left Ear: External ear normal.     Mouth/Throat:     Pharynx: No oropharyngeal exudate or posterior oropharyngeal erythema.  Eyes:     General: No scleral icterus.       Right eye: No discharge.        Left eye: No discharge.     Conjunctiva/sclera: Conjunctivae normal.  Cardiovascular:      Rate and Rhythm: Normal rate and regular rhythm.  Pulmonary:     Effort: Pulmonary effort is normal. No respiratory distress.     Breath sounds: Normal breath sounds.  Abdominal:     General: Bowel sounds are normal.     Palpations: Abdomen is soft.     Tenderness: There is no abdominal tenderness.  Musculoskeletal:        General: No swelling or tenderness.     Cervical back: Neck supple. No tenderness.  Lymphadenopathy:     Cervical: No cervical adenopathy.  Skin:    Findings: No erythema  or rash.  Neurological:     Mental Status: He is alert.  Psychiatric:        Mood and Affect: Mood normal.        Behavior: Behavior normal.         Outpatient Encounter Medications as of 10/04/2023  Medication Sig   aspirin  EC 81 MG tablet Take 1 tablet (81 mg total) by mouth daily. Swallow whole.   [DISCONTINUED] carvedilol  (COREG ) 6.25 MG tablet Take 1 tablet (6.25 mg total) by mouth 2 (two) times daily with a meal.   [DISCONTINUED] hydrochlorothiazide  (HYDRODIURIL ) 12.5 MG tablet Take 1 TABLET (12.5 mg total) by mouth daily.   [DISCONTINUED] lisinopril  (ZESTRIL ) 40 MG tablet Take 1 tablet (40 mg total) by mouth daily.   [DISCONTINUED] rosuvastatin  (CRESTOR ) 20 MG tablet Take 1 tablet (20 mg total) by mouth daily.   carvedilol  (COREG ) 6.25 MG tablet Take 1 tablet (6.25 mg total) by mouth 2 (two) times daily with a meal.   lisinopril  (ZESTRIL ) 40 MG tablet Take 1 tablet (40 mg total) by mouth daily.   rosuvastatin  (CRESTOR ) 20 MG tablet Take 1 tablet (20 mg total) by mouth daily.   No facility-administered encounter medications on file as of 10/04/2023.     Lab Results  Component Value Date   WBC 6.0 10/02/2023   HGB 14.8 10/02/2023   HCT 43.5 10/02/2023   PLT 285.0 10/02/2023   GLUCOSE 106 (H) 10/02/2023   CHOL 138 10/02/2023   TRIG 102.0 10/02/2023   HDL 39.80 10/02/2023   LDLDIRECT 166.3 05/08/2013   LDLCALC 78 10/02/2023   ALT 18 10/02/2023   AST 16 10/02/2023   NA 141  10/02/2023   K 4.0 10/02/2023   CL 105 10/02/2023   CREATININE 1.24 10/02/2023   BUN 21 10/02/2023   CO2 31 10/02/2023   TSH 0.70 04/05/2023   PSA 0.63 04/05/2023   HGBA1C 5.6 12/05/2016       Assessment & Plan:  Atrial fibrillation, unspecified type [facility]) Assessment & Plan: Recently diagnosed with afib. S/p ablation 12/20/22. Followed by Dr Marven Slimmer.  Previously on eliquis .  Now on aspirn daily. Denies increased heart rate or palpitations.    Essential hypertension, benign Assessment & Plan: Currently on coreg  and hydrochlorothiazide . Not orthostatic on exam. Feels his symptoms are related to his medication - specifically after taking coreg . (Feels better when not taking coreg ). No chest pain or sob reported. Will hold on further testing and adjust medication to see if symptoms improve. Discussed recent labs.  GFR decreasing. Calcium  increased. Will hold hydrochlorothiazide . Decrease carvedilol  - 1/2 tablet bid. Follow pressures. Call with update. Schedule soon follow up.   Orders: -     Basic metabolic panel with GFR; Future  Hypercholesterolemia Assessment & Plan: Continue crestor . Low cholesterol diet and exercise. Discussed recent labs.   Lab Results  Component Value Date   CHOL 138 10/02/2023   HDL 39.80 10/02/2023   LDLCALC 78 10/02/2023   LDLDIRECT 166.3 05/08/2013   TRIG 102.0 10/02/2023   CHOLHDL 3 10/02/2023     Orders: -     Hepatic function panel; Future -     Lipid panel; Future  OSA (obstructive sleep apnea) Assessment & Plan: Not using cpap. Discussed importance of treating sleep apnea and risk of untreated sleep apnea. Disucssed untreated sleep apnea could be contributing to his increased fatigue.    Other orders -     Carvedilol ; Take 1 tablet (6.25 mg total) by mouth 2 (two) times daily  with a meal.  Dispense: 180 tablet; Refill: 3 -     Lisinopril ; Take 1 tablet (40 mg total) by mouth daily.  Dispense: 90 tablet; Refill: 3 -     Rosuvastatin   Calcium ; Take 1 tablet (20 mg total) by mouth daily.  Dispense: 90 tablet; Refill: 3     Dellar Fenton, MD

## 2023-10-04 NOTE — Patient Instructions (Signed)
 Stop the hydrochlorothiazide   Decrease carvedilol  to 1/2 tablet twice a day.

## 2023-10-05 ENCOUNTER — Encounter: Payer: Self-pay | Admitting: Internal Medicine

## 2023-10-05 NOTE — Assessment & Plan Note (Addendum)
 Not using cpap. Discussed importance of treating sleep apnea and risk of untreated sleep apnea. Disucssed untreated sleep apnea could be contributing to his increased fatigue.

## 2023-10-05 NOTE — Assessment & Plan Note (Signed)
 Continue crestor . Low cholesterol diet and exercise. Discussed recent labs.   Lab Results  Component Value Date   CHOL 138 10/02/2023   HDL 39.80 10/02/2023   LDLCALC 78 10/02/2023   LDLDIRECT 166.3 05/08/2013   TRIG 102.0 10/02/2023   CHOLHDL 3 10/02/2023

## 2023-10-05 NOTE — Assessment & Plan Note (Signed)
 Currently on coreg  and hydrochlorothiazide . Not orthostatic on exam. Feels his symptoms are related to his medication - specifically after taking coreg . (Feels better when not taking coreg ). No chest pain or sob reported. Will hold on further testing and adjust medication to see if symptoms improve. Discussed recent labs.  GFR decreasing. Calcium  increased. Will hold hydrochlorothiazide . Decrease carvedilol  - 1/2 tablet bid. Follow pressures. Call with update. Schedule soon follow up.

## 2023-10-05 NOTE — Assessment & Plan Note (Signed)
 Recently diagnosed with afib. S/p ablation 12/20/22. Followed by Dr Marven Slimmer.  Previously on eliquis .  Now on aspirn daily. Denies increased heart rate or palpitations.

## 2023-11-04 ENCOUNTER — Encounter: Payer: Self-pay | Admitting: Internal Medicine

## 2023-11-05 NOTE — Telephone Encounter (Signed)
 Given blood pressure increased, will probably need to adjust medication. I would like to reevaluate him. Please hold for work in appt.

## 2023-11-08 NOTE — Telephone Encounter (Signed)
 If blood pressure remains elevated, would like earlier work in. Keep us  posted on readings.

## 2023-11-08 NOTE — Telephone Encounter (Signed)
 Are you okay with pt waiting til August 1st to review bp and meds?

## 2023-11-18 NOTE — Telephone Encounter (Signed)
 Called patient to confirm ok. His blood pressure is still staying elevated and his heart rate is still wnl but higher than normal for him. Patient is going to continue to monitor until Wednesday. He is not having any new acute symptoms. If anything acute, will be evaluated more emergently.

## 2023-11-20 ENCOUNTER — Ambulatory Visit: Admitting: Internal Medicine

## 2023-11-20 ENCOUNTER — Encounter: Payer: Self-pay | Admitting: Internal Medicine

## 2023-11-20 VITALS — BP 112/68 | HR 63 | Resp 16 | Ht 69.0 in | Wt 222.0 lb

## 2023-11-20 DIAGNOSIS — E7800 Pure hypercholesterolemia, unspecified: Secondary | ICD-10-CM | POA: Diagnosis not present

## 2023-11-20 DIAGNOSIS — I4891 Unspecified atrial fibrillation: Secondary | ICD-10-CM

## 2023-11-20 DIAGNOSIS — I10 Essential (primary) hypertension: Secondary | ICD-10-CM | POA: Diagnosis not present

## 2023-11-20 MED ORDER — AMLODIPINE BESYLATE 2.5 MG PO TABS: 2.5000 mg | ORAL_TABLET | Freq: Every day | ORAL | 2 refills | Status: DC

## 2023-11-20 NOTE — Assessment & Plan Note (Signed)
 Continue crestor . Low cholesterol diet and exercise.  No changes today. Lab Results  Component Value Date   CHOL 138 10/02/2023   HDL 39.80 10/02/2023   LDLCALC 78 10/02/2023   LDLDIRECT 166.3 05/08/2013   TRIG 102.0 10/02/2023   CHOLHDL 3 10/02/2023

## 2023-11-20 NOTE — Assessment & Plan Note (Signed)
 Currently taking one half carvedilol  twice a day.  Continues on lisinopril  40 mg daily.  Given persistent elevated blood pressure around the 140s range we will add a low-dose of amlodipine  2.5 mg daily.  His pulse rate is remaining in the 50-60 range.  Will hold off on increasing carvedilol  dosing.  Monitor pressures.  Send in readings.

## 2023-11-20 NOTE — Assessment & Plan Note (Signed)
 Recently diagnosed with afib. S/p ablation 12/20/22. Followed by Dr Cindie.  Previously on eliquis .  Now on aspirn daily. Denies increased heart rate or palpitations.  Had the 1 episode documented on [facility] mobile.  Remainder of his tracings on cardia mobile he reports have reported normal rhythm.  He was having no symptoms.  This was just a routine check.  He checks regularly - several times a day.  Is overall feeling better.  Continue aspirin .  Continue carvedilol .  Has follow-up planned with cardiology.  States he was instructed by cardiology that if he has persistent problems to let them know.  Currently doing well.

## 2023-11-20 NOTE — Progress Notes (Signed)
 Subjective:    Patient ID: Samuel Elliott., male    DOB: 16-Mar-1966, 58 y.o.   MRN: [id number]  Patient here for  Chief Complaint  Patient presents with   Hypertension    HPI Here for a work in appt - work in with concerns regarding persistent elevated blood pressure.  Had f/u with Dr Cindie 03/27/23. No recurrence of afib noted. Remained on eliquis  until February. Started baby aspirin  after stopping eliquis . Also, previously found to have sleep apnea. Recommended auto cpap. Preferred to hold on cpap. Last visit, was on coreg  and hydrochlorothiazide  for his blood pressure. Reported decreased energy. Also, noted GFR decreasing and calcium  elevated.  Carvedilol  was decreased to 1/2 tablet bid. Hydrochlorothiazide  held. Update message sent post visit and he reported feeling better. Blood pressures were noted to be remaining slightly elevated - 140-150/70-80. He is feeling better over the hall.  No dizziness or lightheadedness now.  No headache.  No chest pain tightness or shortness of breath.  No increased heart rate or palpitations.  He had 1 episode recently when he was checking his heart rate on his cardia mobile.  It documented atrial fibrillation.  He was having no symptoms.  He states he just routinely checks it several times a day.  This is only occurred on 1 occasion.  Again no symptoms.  Currently feeling better.  Blood pressure is remaining elevated as outlined.  Discussed treatment options.  In reviewing his pulse rate is remaining around the 50-60 range.  Will hold on increasing carvedilol .   Past Medical History:  Diagnosis Date   History of chicken pox    Hypercholesterolemia    Hypertension    Past Surgical History:  Procedure Laterality Date   ATRIAL FIBRILLATION ABLATION N/A 12/20/2022   Procedure: ATRIAL FIBRILLATION ABLATION;  Surgeon: Cindie Ole DASEN, MD;  Location: [facility];  Service: Cardiovascular;  Laterality: N/A;   CARDIOVERSION N/A 05/23/2022    Procedure: CARDIOVERSION;  Surgeon: Darliss Rogue, MD;  Location: [facility] OR[facility];  Service: Cardiovascular;  Laterality: N/A;   CARDIOVERSION N/A 06/19/2022   Procedure: CARDIOVERSION;  Surgeon: Cindie Ole DASEN, MD;  Location: [facility] INVASIVE CV LAB;  Service: Cardiovascular;  Laterality: N/A;   TUMOR REMOVAL  7/08 & 5/14   left shoulder, lipoma   Family History  Problem Relation Age of Onset   Hyperlipidemia Mother    Hypertension Mother    Hyperlipidemia Father    Kidney cancer Father    Heart Problems Father        Afib   Hyperlipidemia Paternal Aunt    Colon cancer Paternal Uncle    Prostate cancer Paternal Uncle    Hyperlipidemia Paternal Uncle    Hyperlipidemia Paternal Grandmother    Hyperlipidemia Paternal Grandfather    Social History   Socioeconomic History   Marital status: Married    Spouse name: Not on file   Number of children: Not on file   Years of education: Not on file   Highest education level: Bachelor's degree (e.g., BA, AB, BS)  Occupational History   Not on file  Tobacco Use   Smoking status: Never    Passive exposure: Never   Smokeless tobacco: Never  Vaping Use   Vaping status: Never Used  Substance and Sexual Activity   Alcohol use: Yes    Alcohol/week: 0.0 standard drinks of alcohol    Comment: rarley   Drug use: No   Sexual activity: Not on file  Other Topics Concern  Not on file  Social History Narrative   Lives with Ar[place], wife, and pets (cat/dog).   Social Drivers of Corporate investment banker Strain: Low Risk  (11/18/2023)   Overall Financial Resource Strain (CARDIA)    Difficulty of Paying Living Expenses: Not hard at all  Food Insecurity: No Food Insecurity (11/18/2023)   Hunger Vital Sign    Worried About Running Out of Food in the Last Year: Never true    Ran Out of Food in the Last Year: Never true  Transportation Needs: No Transportation Needs (11/18/2023)   PRAPARE - Administrator, Civil Service (Medical): No     Lack of Transportation (Non-Medical): No  Physical Activity: Sufficiently Active (11/18/2023)   Exercise Vital Sign    Days of Exercise per Week: 7 days    Minutes of Exercise per Session: 30 min  Stress: No Stress Concern Present (11/18/2023)   [employer] of Occupational Health - Occupational Stress Questionnaire    Feeling of Stress: Not at all  Social Connections: Socially Integrated (11/18/2023)   Social Connection and Isolation Panel    Frequency of Communication with Friends and Family: More than three times a week    Frequency of Social Gatherings with Friends and Family: More than three times a week    Attends Religious Services: More than 4 times per year    Active Member of [employer] or Organizations: Yes    Attends Engineer, structural: More than 4 times per year    Marital Status: Married     Review of Systems  Constitutional:  Negative for appetite change and unexpected weight change.  HENT:  Negative for congestion and sinus pressure.   Respiratory:  Negative for cough, chest tightness and shortness of breath.   Cardiovascular:  Negative for chest pain, palpitations and leg swelling.  Gastrointestinal:  Negative for abdominal pain, diarrhea, nausea and vomiting.  Genitourinary:  Negative for difficulty urinating and dysuria.  Musculoskeletal:  Negative for joint swelling and myalgias.  Skin:  Negative for color change and rash.  Neurological:  Negative for dizziness and headaches.  Psychiatric/Behavioral:  Negative for agitation and dysphoric mood.        Objective:     BP 112/68   Pulse 63   Resp 16   Ht 5' 9 (1.753 m)   Wt 222 lb (100.7 kg)   SpO2 98%   BMI 32.78 kg/m  Wt Readings from Last 3 Encounters:  11/20/23 222 lb (100.7 kg)  10/04/23 217 lb 9.6 oz (98.7 kg)  04/05/23 224 lb 12.8 oz (102 kg)    Physical Exam Constitutional:      General: He is not in acute distress.    Appearance: Normal appearance. He is well-developed.  HENT:      Head: Normocephalic and atraumatic.     Right Ear: External ear normal.     Left Ear: External ear normal.     Mouth/Throat:     Pharynx: No oropharyngeal exudate or posterior oropharyngeal erythema.  Eyes:     General: No scleral icterus.       Right eye: No discharge.        Left eye: No discharge.  Cardiovascular:     Rate and Rhythm: Normal rate and regular rhythm.  Pulmonary:     Effort: Pulmonary effort is normal. No respiratory distress.     Breath sounds: Normal breath sounds.  Abdominal:     General: Bowel sounds are normal.  Palpations: Abdomen is soft.     Tenderness: There is no abdominal tenderness.  Musculoskeletal:        General: No swelling or tenderness.     Cervical back: Neck supple. No tenderness.  Lymphadenopathy:     Cervical: No cervical adenopathy.  Skin:    Findings: No erythema or rash.  Neurological:     Mental Status: He is alert.  Psychiatric:        Mood and Affect: Mood normal.        Behavior: Behavior normal.         Outpatient Encounter Medications as of 11/20/2023  Medication Sig   amLODipine  (NORVASC ) 2.5 MG tablet Take 1 tablet (2.5 mg total) by mouth daily.   aspirin  EC 81 MG tablet Take 1 tablet (81 mg total) by mouth daily. Swallow whole.   carvedilol  (COREG ) 6.25 MG tablet Take 1 tablet (6.25 mg total) by mouth 2 (two) times daily with a meal. (Patient taking differently: Take 6.25 mg by mouth 2 (two) times daily with a meal. 1/2 tablet twice daily.)   lisinopril  (ZESTRIL ) 40 MG tablet Take 1 tablet (40 mg total) by mouth daily.   rosuvastatin  (CRESTOR ) 20 MG tablet Take 1 tablet (20 mg total) by mouth daily.   No facility-administered encounter medications on file as of 11/20/2023.     Lab Results  Component Value Date   WBC 6.0 10/02/2023   HGB 14.8 10/02/2023   HCT 43.5 10/02/2023   PLT 285.0 10/02/2023   GLUCOSE 106 (H) 10/02/2023   CHOL 138 10/02/2023   TRIG 102.0 10/02/2023   HDL 39.80 10/02/2023    LDLDIRECT 166.3 05/08/2013   LDLCALC 78 10/02/2023   ALT 18 10/02/2023   AST 16 10/02/2023   NA 141 10/02/2023   K 4.0 10/02/2023   CL 105 10/02/2023   CREATININE 1.24 10/02/2023   BUN 21 10/02/2023   CO2 31 10/02/2023   TSH 0.70 04/05/2023   PSA 0.63 04/05/2023   HGBA1C 5.6 12/05/2016       Assessment & Plan:  Atrial fibrillation, unspecified type [facility]) Assessment & Plan: Recently diagnosed with afib. S/p ablation 12/20/22. Followed by Dr Cindie.  Previously on eliquis .  Now on aspirn daily. Denies increased heart rate or palpitations.  Had the 1 episode documented on Kardia mobile.  Remainder of his tracings on cardia mobile he reports have reported normal rhythm.  He was having no symptoms.  This was just a routine check.  He checks regularly - several times a day.  Is overall feeling better.  Continue aspirin .  Continue carvedilol .  Has follow-up planned with cardiology.  States he was instructed by cardiology that if he has persistent problems to let them know.  Currently doing well.   Essential hypertension, benign Assessment & Plan: Currently taking one half carvedilol  twice a day.  Continues on lisinopril  40 mg daily.  Given persistent elevated blood pressure around the 140s range we will add a low-dose of amlodipine  2.5 mg daily.  His pulse rate is remaining in the 50-60 range.  Will hold off on increasing carvedilol  dosing.  Monitor pressures.  Send in readings.   Hypercholesterolemia Assessment & Plan: Continue crestor . Low cholesterol diet and exercise.  No changes today. Lab Results  Component Value Date   CHOL 138 10/02/2023   HDL 39.80 10/02/2023   LDLCALC 78 10/02/2023   LDLDIRECT 166.3 05/08/2013   TRIG 102.0 10/02/2023   CHOLHDL 3 10/02/2023      Other orders -  amLODIPine  Besylate; Take 1 tablet (2.5 mg total) by mouth daily.  Dispense: 30 tablet; Refill: 2     Allena Hamilton, MD

## 2023-11-29 ENCOUNTER — Ambulatory Visit: Admitting: Internal Medicine

## 2023-12-08 ENCOUNTER — Encounter: Payer: Self-pay | Admitting: Internal Medicine

## 2024-01-20 ENCOUNTER — Ambulatory Visit: Payer: Self-pay | Admitting: Internal Medicine

## 2024-01-20 ENCOUNTER — Other Ambulatory Visit (INDEPENDENT_AMBULATORY_CARE_PROVIDER_SITE_OTHER)

## 2024-01-20 DIAGNOSIS — I10 Essential (primary) hypertension: Secondary | ICD-10-CM

## 2024-01-20 DIAGNOSIS — E7800 Pure hypercholesterolemia, unspecified: Secondary | ICD-10-CM

## 2024-01-20 LAB — BASIC METABOLIC PANEL WITH GFR
BUN: 13 mg/dL (ref 6–23)
CO2: 30 meq/L (ref 19–32)
Calcium: 10.2 mg/dL (ref 8.4–10.5)
Chloride: 105 meq/L (ref 96–112)
Creatinine, Ser: 1.08 mg/dL (ref 0.40–1.50)
GFR: 75.05 mL/min (ref >60.00)
Glucose, Bld: 102 mg/dL — ABNORMAL HIGH (ref 70–99)
Potassium: 4.4 meq/L (ref 3.5–5.1)
Sodium: 141 meq/L (ref 135–145)

## 2024-01-20 LAB — LIPID PANEL
Cholesterol: 138 mg/dL (ref 0–200)
HDL: 51 mg/dL (ref >39.00)
LDL Cholesterol: 71 mg/dL (ref 0–99)
NonHDL: 87.04
Total CHOL/HDL Ratio: 3
Triglycerides: 78 mg/dL (ref 0.0–149.0)
VLDL: 15.6 mg/dL (ref 0.0–40.0)

## 2024-01-20 LAB — HEPATIC FUNCTION PANEL
ALT: 18 U/L (ref 0–53)
AST: 17 U/L (ref 0–37)
Albumin: 4.3 g/dL (ref 3.5–5.2)
Alkaline Phosphatase: 75 U/L (ref 39–117)
Bilirubin, Direct: 0.1 mg/dL (ref 0.0–0.3)
Total Bilirubin: 0.7 mg/dL (ref 0.2–1.2)
Total Protein: 6.9 g/dL (ref 6.0–8.3)

## 2024-01-22 ENCOUNTER — Ambulatory Visit: Admitting: Internal Medicine

## 2024-01-22 VITALS — BP 138/84 | HR 66 | Resp 16 | Ht 69.0 in | Wt 219.0 lb

## 2024-01-22 DIAGNOSIS — E7800 Pure hypercholesterolemia, unspecified: Secondary | ICD-10-CM

## 2024-01-22 DIAGNOSIS — E559 Vitamin D deficiency, unspecified: Secondary | ICD-10-CM | POA: Diagnosis not present

## 2024-01-22 DIAGNOSIS — E041 Nontoxic single thyroid nodule: Secondary | ICD-10-CM

## 2024-01-22 DIAGNOSIS — I10 Essential (primary) hypertension: Secondary | ICD-10-CM

## 2024-01-22 DIAGNOSIS — Z125 Encounter for screening for malignant neoplasm of prostate: Secondary | ICD-10-CM

## 2024-01-22 DIAGNOSIS — I4891 Unspecified atrial fibrillation: Secondary | ICD-10-CM

## 2024-01-22 MED ORDER — AMLODIPINE BESYLATE 5 MG PO TABS: 5.0000 mg | ORAL_TABLET | Freq: Every day | ORAL | 1 refills | Status: DC

## 2024-01-22 NOTE — Progress Notes (Signed)
 Subjective:    Patient ID: Samuel Genette Rosabel Mickey., male    DOB: 28-Jul-1965, 58 y.o.   MRN: [id number]  Patient here for  Chief Complaint  Patient presents with   Medical Management of Chronic Issues    HPI Here for a scheduled follow up- follow up regarding hypertension, afib and hypercholesterolemia.  Had f/u with Dr Cindie 03/27/23. No recurrence of afib noted. Remained on eliquis  until February. Started baby aspirin  after stopping eliquis . Also, previously found to have sleep apnea. Recommended auto cpap. Preferred to hold on cpap. Last visit, he was started on low dose amlodpiine. He does feel better overall. Not as fatigued. Feels from a heart standpoint - stable. No chest pain or sob reported. Walking 2 miles per day. No abdominal pian or bowel change reported. Reviewed outside blood pressures - most averaging 130-140s/70-80s.    Past Medical History:  Diagnosis Date   History of chicken pox    Hypercholesterolemia    Hypertension    Past Surgical History:  Procedure Laterality Date   ATRIAL FIBRILLATION ABLATION N/A 12/20/2022   Procedure: ATRIAL FIBRILLATION ABLATION;  Surgeon: Cindie Ole DASEN, MD;  Location: [facility];  Service: Cardiovascular;  Laterality: N/A;   CARDIOVERSION N/A 05/23/2022   Procedure: CARDIOVERSION;  Surgeon: Darliss Rogue, MD;  Location: [facility] ORS;  Service: Cardiovascular;  Laterality: N/A;   CARDIOVERSION N/A 06/19/2022   Procedure: CARDIOVERSION;  Surgeon: Cindie Ole DASEN, MD;  Location: [facility] INVASIVE CV LAB;  Service: Cardiovascular;  Laterality: N/A;   TUMOR REMOVAL  7/08 & 5/14   left shoulder, lipoma   Family History  Problem Relation Age of Onset   Hyperlipidemia Mother    Hypertension Mother    Hyperlipidemia Father    Kidney cancer Father    Heart Problems Father        Afib   Hyperlipidemia Paternal Aunt    Colon cancer Paternal Uncle    Prostate cancer Paternal Uncle    Hyperlipidemia Paternal Uncle     Hyperlipidemia Paternal Grandmother    Hyperlipidemia Paternal Grandfather    Social History   Socioeconomic History   Marital status: Married    Spouse name: Not on file   Number of children: Not on file   Years of education: Not on file   Highest education level: Bachelor's degree (e.g., BA, AB, BS)  Occupational History   Not on file  Tobacco Use   Smoking status: Never    Passive exposure: Never   Smokeless tobacco: Never  Vaping Use   Vaping status: Never Used  Substance and Sexual Activity   Alcohol use: Yes    Alcohol/week: 0.0 standard drinks of alcohol    Comment: rarley   Drug use: No   Sexual activity: Not on file  Other Topics Concern   Not on file  Social History Narrative   Lives with [place], wife, and pets (cat/dog).   Social Drivers of Corporate investment banker Strain: Low Risk  (11/18/2023)   Overall Financial Resource Strain (CARDIA)    Difficulty of Paying Living Expenses: Not hard at all  Food Insecurity: No Food Insecurity (11/18/2023)   Hunger Vital Sign    Worried About Running Out of Food in the Last Year: Never true    Ran Out of Food in the Last Year: Never true  Transportation Needs: No Transportation Needs (11/18/2023)   PRAPARE - Administrator, Civil Service (Medical): No    Lack of Transportation (Non-Medical):  No  Physical Activity: Sufficiently Active (11/18/2023)   Exercise Vital Sign    Days of Exercise per Week: 7 days    Minutes of Exercise per Session: 30 min  Stress: No Stress Concern Present (11/18/2023)   [employer] of Occupational Health - Occupational Stress Questionnaire    Feeling of Stress: Not at all  Social Connections: Socially Integrated (11/18/2023)   Social Connection and Isolation Panel    Frequency of Communication with Friends and Family: More than three times a week    Frequency of Social Gatherings with Friends and Family: More than three times a week    Attends Religious Services: More than 4  times per year    Active Member of [employer] or Organizations: Yes    Attends Engineer, structural: More than 4 times per year    Marital Status: Married     Review of Systems  Constitutional:  Negative for appetite change and unexpected weight change.  HENT:  Negative for congestion and sinus pressure.   Respiratory:  Negative for cough, chest tightness and shortness of breath.   Cardiovascular:  Negative for chest pain, palpitations and leg swelling.  Gastrointestinal:  Negative for abdominal pain, diarrhea, nausea and vomiting.  Genitourinary:  Negative for difficulty urinating and dysuria.  Musculoskeletal:  Negative for joint swelling and myalgias.  Skin:  Negative for color change and rash.  Neurological:  Negative for dizziness and headaches.  Psychiatric/Behavioral:  Negative for agitation and dysphoric mood.        Objective:     BP 138/84   Pulse 66   Resp 16   Ht 5' 9 (1.753 m)   Wt 219 lb (99.3 kg)   SpO2 98%   BMI 32.34 kg/m  Wt Readings from Last 3 Encounters:  01/22/24 219 lb (99.3 kg)  11/20/23 222 lb (100.7 kg)  10/04/23 217 lb 9.6 oz (98.7 kg)    Physical Exam Vitals reviewed.  Constitutional:      General: He is not in acute distress.    Appearance: Normal appearance. He is well-developed.  HENT:     Head: Normocephalic and atraumatic.     Right Ear: External ear normal.     Left Ear: External ear normal.     Mouth/Throat:     Pharynx: No oropharyngeal exudate or posterior oropharyngeal erythema.  Eyes:     General: No scleral icterus.       Right eye: No discharge.        Left eye: No discharge.     Conjunctiva/sclera: Conjunctivae normal.  Cardiovascular:     Rate and Rhythm: Normal rate and regular rhythm.  Pulmonary:     Effort: Pulmonary effort is normal. No respiratory distress.     Breath sounds: Normal breath sounds.  Abdominal:     General: Bowel sounds are normal.     Palpations: Abdomen is soft.     Tenderness: There is  no abdominal tenderness.  Musculoskeletal:        General: No swelling or tenderness.     Cervical back: Neck supple. No tenderness.  Lymphadenopathy:     Cervical: No cervical adenopathy.  Skin:    Findings: No erythema or rash.  Neurological:     Mental Status: He is alert.  Psychiatric:        Mood and Affect: Mood normal.        Behavior: Behavior normal.         Outpatient Encounter Medications as of 01/22/2024  Medication Sig   amLODipine  (NORVASC ) 5 MG tablet Take 1 tablet (5 mg total) by mouth daily.   aspirin  EC 81 MG tablet Take 1 tablet (81 mg total) by mouth daily. Swallow whole.   carvedilol  (COREG ) 6.25 MG tablet Take 1 tablet (6.25 mg total) by mouth 2 (two) times daily with a meal. (Patient taking differently: Take 6.25 mg by mouth 2 (two) times daily with a meal. 1/2 tablet twice daily.)   lisinopril  (ZESTRIL ) 40 MG tablet Take 1 tablet (40 mg total) by mouth daily.   rosuvastatin  (CRESTOR ) 20 MG tablet Take 1 tablet (20 mg total) by mouth daily.   [DISCONTINUED] amLODipine  (NORVASC ) 2.5 MG tablet Take 1 tablet (2.5 mg total) by mouth daily.   No facility-administered encounter medications on file as of 01/22/2024.     Lab Results  Component Value Date   WBC 6.0 10/02/2023   HGB 14.8 10/02/2023   HCT 43.5 10/02/2023   PLT 285.0 10/02/2023   GLUCOSE 102 (H) 01/20/2024   CHOL 138 01/20/2024   TRIG 78.0 01/20/2024   HDL 51.00 01/20/2024   LDLDIRECT 166.3 05/08/2013   LDLCALC 71 01/20/2024   ALT 18 01/20/2024   AST 17 01/20/2024   NA 141 01/20/2024   K 4.4 01/20/2024   CL 105 01/20/2024   CREATININE 1.08 01/20/2024   BUN 13 01/20/2024   CO2 30 01/20/2024   TSH 0.70 04/05/2023   PSA 0.63 04/05/2023   HGBA1C 5.6 12/05/2016       Assessment & Plan:  Atrial fibrillation, unspecified type [facility]) Assessment & Plan: Recently diagnosed with afib. S/p ablation 12/20/22. Followed by Dr Cindie.  Previously on eliquis .  Now on aspirn daily. Doing well.  Follow.    Vitamin D  deficiency Assessment & Plan: Check vitamin D  level.   Orders: -     VITAMIN D  25 Hydroxy (Vit-D Deficiency, Fractures); Future  Prostate cancer screening -     PSA; Future  Hypercholesterolemia Assessment & Plan: Continue crestor . Low cholesterol diet and exercise.  No changes today. Lab Results  Component Value Date   CHOL 138 01/20/2024   HDL 51.00 01/20/2024   LDLCALC 71 01/20/2024   LDLDIRECT 166.3 05/08/2013   TRIG 78.0 01/20/2024   CHOLHDL 3 01/20/2024     Orders: -     Lipid panel; Future -     Hepatic function panel; Future  Thyroid  nodule Assessment & Plan: Saw Dr Damian.  S/p biopsies - ok.  Felt no further w/up warranted.  Follow.  TSH.   Orders: -     TSH; Future  Essential hypertension, benign Assessment & Plan: Currently taking one half carvedilol  twice a day.  Continues on lisinopril  40 mg daily.  Currently on amlodipine  2.5 mg daily.  Given persistent elevation, will increase amlodipine  to 5mg  q day. Follow pressures. Follow metabolic panel.   Orders: -     Basic metabolic panel with GFR; Future  Other orders -     amLODIPine  Besylate; Take 1 tablet (5 mg total) by mouth daily.  Dispense: 90 tablet; Refill: 1     Allena Hamilton, MD

## 2024-01-26 ENCOUNTER — Encounter: Payer: Self-pay | Admitting: Internal Medicine

## 2024-01-26 NOTE — Assessment & Plan Note (Signed)
 Check vitamin D  level

## 2024-01-26 NOTE — Assessment & Plan Note (Signed)
 Continue crestor . Low cholesterol diet and exercise.  No changes today. Lab Results  Component Value Date   CHOL 138 01/20/2024   HDL 51.00 01/20/2024   LDLCALC 71 01/20/2024   LDLDIRECT 166.3 05/08/2013   TRIG 78.0 01/20/2024   CHOLHDL 3 01/20/2024

## 2024-01-26 NOTE — Assessment & Plan Note (Signed)
 Saw Dr Damian.  S/p biopsies - ok.  Felt no further w/up warranted.  Follow.  TSH.

## 2024-01-26 NOTE — Assessment & Plan Note (Signed)
 Recently diagnosed with afib. S/p ablation 12/20/22. Followed by Dr Cindie.  Previously on eliquis .  Now on aspirn daily. Doing well. Follow.

## 2024-01-26 NOTE — Assessment & Plan Note (Signed)
 Currently taking one half carvedilol  twice a day.  Continues on lisinopril  40 mg daily.  Currently on amlodipine  2.5 mg daily.  Given persistent elevation, will increase amlodipine  to 5mg  q day. Follow pressures. Follow metabolic panel.

## 2024-05-06 ENCOUNTER — Other Ambulatory Visit

## 2024-05-06 DIAGNOSIS — Z125 Encounter for screening for malignant neoplasm of prostate: Secondary | ICD-10-CM | POA: Diagnosis not present

## 2024-05-06 DIAGNOSIS — E041 Nontoxic single thyroid nodule: Secondary | ICD-10-CM | POA: Diagnosis not present

## 2024-05-06 DIAGNOSIS — I10 Essential (primary) hypertension: Secondary | ICD-10-CM

## 2024-05-06 DIAGNOSIS — E559 Vitamin D deficiency, unspecified: Secondary | ICD-10-CM | POA: Diagnosis not present

## 2024-05-06 DIAGNOSIS — E7800 Pure hypercholesterolemia, unspecified: Secondary | ICD-10-CM

## 2024-05-06 LAB — LIPID PANEL
Cholesterol: 140 mg/dL (ref 28–200)
HDL: 46.6 mg/dL
LDL Cholesterol: 75 mg/dL (ref 10–99)
NonHDL: 92.97
Total CHOL/HDL Ratio: 3
Triglycerides: 88 mg/dL (ref 10.0–149.0)
VLDL: 17.6 mg/dL (ref 0.0–40.0)

## 2024-05-06 LAB — HEPATIC FUNCTION PANEL
ALT: 21 U/L (ref 3–53)
AST: 20 U/L (ref 5–37)
Albumin: 4.2 g/dL (ref 3.5–5.2)
Alkaline Phosphatase: 78 U/L (ref 39–117)
Bilirubin, Direct: 0.1 mg/dL (ref 0.1–0.3)
Total Bilirubin: 0.7 mg/dL (ref 0.2–1.2)
Total Protein: 6.9 g/dL (ref 6.0–8.3)

## 2024-05-06 LAB — BASIC METABOLIC PANEL WITH GFR
BUN: 15 mg/dL (ref 6–23)
CO2: 31 meq/L (ref 19–32)
Calcium: 10.2 mg/dL (ref 8.4–10.5)
Chloride: 106 meq/L (ref 96–112)
Creatinine, Ser: 1.08 mg/dL (ref 0.40–1.50)
GFR: 74.9 mL/min
Glucose, Bld: 88 mg/dL (ref 70–99)
Potassium: 4.4 meq/L (ref 3.5–5.1)
Sodium: 142 meq/L (ref 135–145)

## 2024-05-06 LAB — TSH: TSH: 1.35 u[IU]/mL (ref 0.35–5.50)

## 2024-05-06 LAB — VITAMIN D 25 HYDROXY (VIT D DEFICIENCY, FRACTURES): VITD: 26.95 ng/mL — ABNORMAL LOW (ref 30.00–100.00)

## 2024-05-06 LAB — PSA: PSA: 0.45 ng/mL (ref 0.10–4.00)

## 2024-05-07 ENCOUNTER — Ambulatory Visit: Payer: Self-pay | Admitting: Internal Medicine

## 2024-05-08 ENCOUNTER — Encounter: Payer: Self-pay | Admitting: Internal Medicine

## 2024-05-08 ENCOUNTER — Ambulatory Visit (INDEPENDENT_AMBULATORY_CARE_PROVIDER_SITE_OTHER): Admitting: Internal Medicine

## 2024-05-08 VITALS — BP 132/80 | HR 65 | Temp 98.2°F | Ht 69.0 in | Wt 222.4 lb

## 2024-05-08 DIAGNOSIS — E7800 Pure hypercholesterolemia, unspecified: Secondary | ICD-10-CM | POA: Diagnosis not present

## 2024-05-08 DIAGNOSIS — E041 Nontoxic single thyroid nodule: Secondary | ICD-10-CM | POA: Diagnosis not present

## 2024-05-08 DIAGNOSIS — I10 Essential (primary) hypertension: Secondary | ICD-10-CM | POA: Diagnosis not present

## 2024-05-08 DIAGNOSIS — G4733 Obstructive sleep apnea (adult) (pediatric): Secondary | ICD-10-CM | POA: Diagnosis not present

## 2024-05-08 DIAGNOSIS — E559 Vitamin D deficiency, unspecified: Secondary | ICD-10-CM | POA: Diagnosis not present

## 2024-05-08 DIAGNOSIS — Z0000 Encounter for general adult medical examination without abnormal findings: Secondary | ICD-10-CM | POA: Diagnosis not present

## 2024-05-08 DIAGNOSIS — Z1211 Encounter for screening for malignant neoplasm of colon: Secondary | ICD-10-CM | POA: Diagnosis not present

## 2024-05-08 DIAGNOSIS — I4891 Unspecified atrial fibrillation: Secondary | ICD-10-CM | POA: Diagnosis not present

## 2024-05-08 MED ORDER — AMLODIPINE BESYLATE 5 MG PO TABS: 5.0000 mg | ORAL_TABLET | Freq: Every day | ORAL | 1 refills | Status: AC

## 2024-05-08 NOTE — Progress Notes (Signed)
 "  Subjective:    Patient ID: Samuel Genette Rosabel Mickey., male    DOB: 1965/11/14, 59 y.o.   MRN: [id number]  Patient here for  Chief Complaint  Patient presents with   Annual Exam    HPI Here for a physical exam. Had f/u with Dr Cindie 03/27/23. No recurrence of afib noted. Remained on eliquis  until February. Started baby aspirin  after stopping eliquis . Also, previously found to have sleep apnea. Recommended auto cpap. Had preferred to hold on cpap. Last visit, amlodipine  was increased to 5mg  q day. Outside blood pressures reviewed. Most averaging 120-130s/70s. Breathing stable. No abdominal pain or bowel change reported. Due colonoscopy.    Past Medical History:  Diagnosis Date   History of chicken pox    Hypercholesterolemia    Hypertension    Past Surgical History:  Procedure Laterality Date   ATRIAL FIBRILLATION ABLATION N/A 12/20/2022   Procedure: ATRIAL FIBRILLATION ABLATION;  Surgeon: Cindie Ole DASEN, MD;  Location: [facility] INVASIVE [facility];  Service: Cardiovascular;  Laterality: N/A;   CARDIOVERSION N/A 05/23/2022   Procedure: CARDIOVERSION;  Surgeon: Darliss Rogue, MD;  Location: [facility] ORS;  Service: Cardiovascular;  Laterality: N/A;   CARDIOVERSION N/A 06/19/2022   Procedure: CARDIOVERSION;  Surgeon: Cindie Ole DASEN, MD;  Location: [facility] INVASIVE CV LAB;  Service: Cardiovascular;  Laterality: N/A;   TUMOR REMOVAL  7/08 & 5/14   left shoulder, lipoma   Family History  Problem Relation Age of Onset   Hyperlipidemia Mother    Hypertension Mother    Cancer Mother        Lump in the neck.   Hyperlipidemia Father    Kidney cancer Father    Heart Problems Father        Afib   Cancer Father        Kidney Cancer, Prostrate Cancer   Hyperlipidemia Paternal Aunt    Colon cancer Paternal Uncle    Prostate cancer Paternal Uncle    Hyperlipidemia Paternal Uncle    Hyperlipidemia Paternal Grandmother    Hyperlipidemia Paternal Grandfather    Social History   Socioeconomic  History   Marital status: Married    Spouse name: Not on file   Number of children: Not on file   Years of education: Not on file   Highest education level: Bachelor's degree (e.g., BA, AB, BS)  Occupational History   Not on file  Tobacco Use   Smoking status: Never    Passive exposure: Never   Smokeless tobacco: Never  Vaping Use   Vaping status: Never Used  Substance and Sexual Activity   Alcohol use: Not Currently    Comment: rarley   Drug use: Never   Sexual activity: Yes  Other Topics Concern   Not on file  Social History Narrative   Lives with Ar[place], wife, and pets (cat/dog).   Social Drivers of Health   Tobacco Use: Low Risk (05/16/2024)   Patient History    Smoking Tobacco Use: Never    Smokeless Tobacco Use: Never    Passive Exposure: Never  Financial Resource Strain: Low Risk (05/06/2024)   Overall Financial Resource Strain (CARDIA)    Difficulty of Paying Living Expenses: Not very hard  Food Insecurity: No Food Insecurity (05/06/2024)   Epic    Worried About Radiation Protection Practitioner of Food in the Last Year: Never true    Ran Out of Food in the Last Year: Never true  Transportation Needs: No Transportation Needs (05/06/2024)   Epic    Lack  of Transportation (Medical): No    Lack of Transportation (Non-Medical): No  Physical Activity: Sufficiently Active (05/06/2024)   Exercise Vital Sign    Days of Exercise per Week: 6 days    Minutes of Exercise per Session: 60 min  Stress: No Stress Concern Present (05/06/2024)   [employer] of Occupational Health - Occupational Stress Questionnaire    Feeling of Stress: Not at all  Social Connections: Socially Integrated (05/06/2024)   Social Connection and Isolation Panel    Frequency of Communication with Friends and Family: More than three times a week    Frequency of Social Gatherings with Friends and Family: More than three times a week    Attends Religious Services: More than 4 times per year    Active Member of [employer] or  Organizations: Yes    Attends Banker Meetings: 1 to 4 times per year    Marital Status: Married  Depression (PHQ2-9): Low Risk (05/08/2024)   Depression (PHQ2-9)    PHQ-2 Score: 0  Alcohol Screen: Not on file  Housing: Low Risk (05/06/2024)   Epic    Unable to Pay for Housing in the Last Year: No    Number of Times Moved in the Last Year: 0    Homeless in the Last Year: No  Utilities: Not on file  Health Literacy: Not on file     Review of Systems  Constitutional:  Negative for appetite change and unexpected weight change.  HENT:  Negative for congestion, sinus pressure and sore throat.   Eyes:  Negative for pain and visual disturbance.  Respiratory:  Negative for cough, chest tightness and shortness of breath.   Cardiovascular:  Negative for chest pain, palpitations and leg swelling.  Gastrointestinal:  Negative for abdominal pain, diarrhea, nausea and vomiting.  Genitourinary:  Negative for difficulty urinating and dysuria.  Musculoskeletal:  Negative for joint swelling and myalgias.  Skin:  Negative for color change and rash.  Neurological:  Negative for dizziness and headaches.  Hematological:  Negative for adenopathy. Does not bruise/bleed easily.  Psychiatric/Behavioral:  Negative for agitation and dysphoric mood.        Objective:     BP 132/80   Pulse 65   Temp 98.2 F (36.8 C) (Oral)   Ht 5' 9 (1.753 m)   Wt 222 lb 6.4 oz (100.9 kg)   SpO2 98%   BMI 32.84 kg/m  Wt Readings from Last 3 Encounters:  05/08/24 222 lb 6.4 oz (100.9 kg)  01/22/24 219 lb (99.3 kg)  11/20/23 222 lb (100.7 kg)    Physical Exam Constitutional:      General: He is not in acute distress.    Appearance: Normal appearance. He is well-developed.  HENT:     Head: Normocephalic and atraumatic.     Right Ear: External ear normal.     Left Ear: External ear normal.     Mouth/Throat:     Pharynx: No oropharyngeal exudate or posterior oropharyngeal erythema.  Eyes:      General: No scleral icterus.       Right eye: No discharge.        Left eye: No discharge.     Conjunctiva/sclera: Conjunctivae normal.  Neck:     Thyroid : No thyromegaly.  Cardiovascular:     Rate and Rhythm: Normal rate and regular rhythm.  Pulmonary:     Effort: No respiratory distress.     Breath sounds: Normal breath sounds. No wheezing.  Abdominal:  General: Bowel sounds are normal.     Palpations: Abdomen is soft.     Tenderness: There is no abdominal tenderness.  Musculoskeletal:        General: No swelling or tenderness.     Cervical back: Neck supple. No tenderness.  Lymphadenopathy:     Cervical: No cervical adenopathy.  Skin:    Findings: No erythema or rash.  Neurological:     Mental Status: He is alert and oriented to person, place, and time.  Psychiatric:        Mood and Affect: Mood normal.        Behavior: Behavior normal.         Outpatient Encounter Medications as of 05/08/2024  Medication Sig   aspirin  EC 81 MG tablet Take 1 tablet (81 mg total) by mouth daily. Swallow whole.   carvedilol  (COREG ) 6.25 MG tablet Take 1 tablet (6.25 mg total) by mouth 2 (two) times daily with a meal. (Patient taking differently: Take 6.25 mg by mouth 2 (two) times daily with a meal. 1/2 tablet twice daily.)   cholecalciferol (VITAMIN D3) 25 MCG (1000 UNIT) tablet Take 1,000 Units by mouth daily.   lisinopril  (ZESTRIL ) 40 MG tablet Take 1 tablet (40 mg total) by mouth daily.   rosuvastatin  (CRESTOR ) 20 MG tablet Take 1 tablet (20 mg total) by mouth daily.   amLODipine  (NORVASC ) 5 MG tablet Take 1 tablet (5 mg total) by mouth daily.   [DISCONTINUED] amLODipine  (NORVASC ) 5 MG tablet Take 1 tablet (5 mg total) by mouth daily.   No facility-administered encounter medications on file as of 05/08/2024.     Lab Results  Component Value Date   WBC 6.0 10/02/2023   HGB 14.8 10/02/2023   HCT 43.5 10/02/2023   PLT 285.0 10/02/2023   GLUCOSE 88 05/06/2024   CHOL 140 05/06/2024    TRIG 88.0 05/06/2024   HDL 46.60 05/06/2024   LDLDIRECT 166.3 05/08/2013   LDLCALC 75 05/06/2024   ALT 21 05/06/2024   AST 20 05/06/2024   NA 142 05/06/2024   K 4.4 05/06/2024   CL 106 05/06/2024   CREATININE 1.08 05/06/2024   BUN 15 05/06/2024   CO2 31 05/06/2024   TSH 1.35 05/06/2024   PSA 0.45 05/06/2024   HGBA1C 5.6 12/05/2016       Assessment & Plan:  Health care maintenance Assessment & Plan: Physical today 05/08/24.   Colonoscopy 03/2015. - diverticulosis.  IFOB 04/18/23 - negative. PSA  05/06/24 - .45.   Hypercholesterolemia Assessment & Plan: Continue crestor . Follow lipid panel.  Lab Results  Component Value Date   CHOL 140 05/06/2024   HDL 46.60 05/06/2024   LDLCALC 75 05/06/2024   LDLDIRECT 166.3 05/08/2013   TRIG 88.0 05/06/2024   CHOLHDL 3 05/06/2024     Orders: -     Hepatic function panel; Future -     Lipid panel; Future  Essential hypertension, benign Assessment & Plan: Continue carvedilol , lisinopril  and amlodipine . Follow pressures. Follow metabolic panel.   Orders: -     Basic metabolic panel with GFR; Future -     CBC with Differential/Platelet; Future  Thyroid  nodule Assessment & Plan: Saw Dr Damian.  S/p biopsies - ok.  Felt no further w/up warranted. Follow tsh.   Orders: -     TSH; Future  Vitamin D  deficiency Assessment & Plan: Last vitamin D  level - 26. Continue vitamin D  supplements.    OSA (obstructive sleep apnea) Assessment & Plan: Contiue cpap.  Atrial fibrillation, unspecified type [facility]) Assessment & Plan: Recently diagnosed with afib. S/p ablation 12/20/22. Followed by Dr Cindie.  Previously on eliquis .  Now on aspirn daily. Stable. Follow.    Colon cancer screening -     Ambulatory referral to Gastroenterology  Other orders -     amLODIPine  Besylate; Take 1 tablet (5 mg total) by mouth daily.  Dispense: 90 tablet; Refill: 1     Allena Hamilton, MD "

## 2024-05-08 NOTE — Assessment & Plan Note (Signed)
 Physical today 05/08/24.   Colonoscopy 03/2015. - diverticulosis.  IFOB 04/18/23 - negative. PSA  05/06/24 - .45.

## 2024-05-16 ENCOUNTER — Encounter: Payer: Self-pay | Admitting: Internal Medicine

## 2024-05-16 NOTE — Assessment & Plan Note (Signed)
 Continue crestor . Follow lipid panel.  Lab Results  Component Value Date   CHOL 140 05/06/2024   HDL 46.60 05/06/2024   LDLCALC 75 05/06/2024   LDLDIRECT 166.3 05/08/2013   TRIG 88.0 05/06/2024   CHOLHDL 3 05/06/2024

## 2024-05-16 NOTE — Assessment & Plan Note (Signed)
 Continue carvedilol , lisinopril  and amlodipine . Follow pressures. Follow metabolic panel.

## 2024-05-16 NOTE — Assessment & Plan Note (Signed)
 Last vitamin D  level - 26. Continue vitamin D  supplements.

## 2024-05-16 NOTE — Assessment & Plan Note (Signed)
 Contiue cpap.

## 2024-05-16 NOTE — Assessment & Plan Note (Signed)
 Saw Dr Damian.  S/p biopsies - ok.  Felt no further w/up warranted. Follow tsh.

## 2024-05-16 NOTE — Assessment & Plan Note (Signed)
 Recently diagnosed with afib. S/p ablation 12/20/22. Followed by Dr Cindie.  Previously on eliquis .  Now on aspirn daily. Stable. Follow.

## 2024-05-25 ENCOUNTER — Telehealth: Payer: Self-pay

## 2024-05-25 DIAGNOSIS — Z1211 Encounter for screening for malignant neoplasm of colon: Secondary | ICD-10-CM

## 2024-05-25 NOTE — Telephone Encounter (Signed)
 Patient has been informed that he will be contacted again in Sept to schedule his colonoscopy since it is not due until November.  Thanks,  [place], CMA

## 2024-06-04 ENCOUNTER — Other Ambulatory Visit: Payer: Self-pay

## 2024-06-04 DIAGNOSIS — Z860100 Personal history of colon polyps, unspecified: Secondary | ICD-10-CM

## 2024-06-04 DIAGNOSIS — Z1211 Encounter for screening for malignant neoplasm of colon: Secondary | ICD-10-CM

## 2024-06-04 NOTE — Telephone Encounter (Signed)
 Gastroenterology Pre-Procedure Review  Request Date: 04/09/25 Requesting Physician: Dr. Melany  PATIENT REVIEW QUESTIONS: The patient responded to the following health history questions as indicated:    1. Are you having any GI issues? no 2. Do you have a personal history of Polyps? no 3. Do you have a family history of Colon Cancer or Polyps? no 4. Diabetes Mellitus? no 5. Joint replacements in the past 12 months?no 6. Major health problems in the past 3 months?no 7. Any artificial heart valves, MVP, or defibrillator?no 8. Cardiac issues? [facility]    MEDICATIONS & ALLERGIES:    Patient reports the following regarding taking any anticoagulation/antiplatelet therapy:   Plavix, Coumadin, Eliquis , Xarelto, Lovenox, Pradaxa , Brilinta, or Effient? no Aspirin ? no  Patient confirms/reports the following medications:  Current Outpatient Medications  Medication Sig Dispense Refill   amLODipine  (NORVASC ) 5 MG tablet Take 1 tablet (5 mg total) by mouth daily. 90 tablet 1   aspirin  EC 81 MG tablet Take 1 tablet (81 mg total) by mouth daily. Swallow whole.     carvedilol  (COREG ) 6.25 MG tablet Take 1 tablet (6.25 mg total) by mouth 2 (two) times daily with a meal. (Patient taking differently: Take 6.25 mg by mouth 2 (two) times daily with a meal. 1/2 tablet twice daily.) 180 tablet 3   cholecalciferol (VITAMIN D3) 25 MCG (1000 UNIT) tablet Take 1,000 Units by mouth daily.     lisinopril  (ZESTRIL ) 40 MG tablet Take 1 tablet (40 mg total) by mouth daily. 90 tablet 3   rosuvastatin  (CRESTOR ) 20 MG tablet Take 1 tablet (20 mg total) by mouth daily. 90 tablet 3   No current facility-administered medications for this visit.    Patient confirms/reports the following allergies:  Allergies[1]  No orders of the defined types were placed in this encounter.   AUTHORIZATION INFORMATION Primary Insurance: 1D#: Group #:  Secondary Insurance: 1D#: Group #:  SCHEDULE  INFORMATION: Date: 04/09/25 Time: Location: [facility]    [1] No Known Allergies

## 2024-06-04 NOTE — Progress Notes (Signed)
 Screening colonoscopy Not history of colon polyps

## 2024-09-08 ENCOUNTER — Other Ambulatory Visit

## 2024-09-10 ENCOUNTER — Ambulatory Visit: Admitting: Internal Medicine

## 2025-04-09 ENCOUNTER — Ambulatory Visit: Admit: 2025-04-09 | Admitting: Gastroenterology
